# Patient Record
Sex: Female | Born: 1975
Health system: Southern US, Community
[De-identification: ages and names within clinical notes are randomized; demographics above are authoritative.]

## PROBLEM LIST (undated history)

## (undated) DIAGNOSIS — K760 Fatty (change of) liver, not elsewhere classified: Secondary | ICD-10-CM

## (undated) DIAGNOSIS — Z8742 Personal history of other diseases of the female genital tract: Secondary | ICD-10-CM

## (undated) DIAGNOSIS — E119 Type 2 diabetes mellitus without complications: Secondary | ICD-10-CM

## (undated) DIAGNOSIS — F419 Anxiety disorder, unspecified: Secondary | ICD-10-CM

## (undated) DIAGNOSIS — Z87440 Personal history of urinary (tract) infections: Secondary | ICD-10-CM

## (undated) DIAGNOSIS — Z8619 Personal history of other infectious and parasitic diseases: Secondary | ICD-10-CM

## (undated) DIAGNOSIS — M5126 Other intervertebral disc displacement, lumbar region: Secondary | ICD-10-CM

## (undated) DIAGNOSIS — Z8632 Personal history of gestational diabetes: Secondary | ICD-10-CM

## (undated) DIAGNOSIS — M51369 Other intervertebral disc degeneration, lumbar region without mention of lumbar back pain or lower extremity pain: Secondary | ICD-10-CM

## (undated) DIAGNOSIS — E559 Vitamin D deficiency, unspecified: Secondary | ICD-10-CM

## (undated) DIAGNOSIS — E785 Hyperlipidemia, unspecified: Secondary | ICD-10-CM

## (undated) DIAGNOSIS — M5430 Sciatica, unspecified side: Secondary | ICD-10-CM

## (undated) DIAGNOSIS — M5136 Other intervertebral disc degeneration, lumbar region: Secondary | ICD-10-CM

## (undated) DIAGNOSIS — N949 Unspecified condition associated with female genital organs and menstrual cycle: Secondary | ICD-10-CM

## (undated) DIAGNOSIS — N6019 Diffuse cystic mastopathy of unspecified breast: Secondary | ICD-10-CM

## (undated) HISTORY — DX: Personal history of gestational diabetes: Z86.32

## (undated) HISTORY — DX: Anxiety disorder, unspecified: F41.9

## (undated) HISTORY — DX: Personal history of other infectious and parasitic diseases: Z86.19

## (undated) HISTORY — DX: Type 2 diabetes mellitus without complications: E11.9

## (undated) HISTORY — DX: Personal history of urinary (tract) infections: Z87.440

## (undated) HISTORY — DX: Diffuse cystic mastopathy of unspecified breast: N60.19

## (undated) HISTORY — DX: Personal history of other diseases of the female genital tract: Z87.42

## (undated) HISTORY — DX: Unspecified condition associated with female genital organs and menstrual cycle: N94.9

---

## 1999-02-08 ENCOUNTER — Emergency Department (HOSPITAL_COMMUNITY): Admission: EM | Admit: 1999-02-08 | Discharge: 1999-02-08 | Payer: Self-pay | Admitting: Emergency Medicine

## 1999-02-10 ENCOUNTER — Encounter: Admission: RE | Admit: 1999-02-10 | Discharge: 1999-02-10 | Payer: Self-pay | Admitting: Internal Medicine

## 1999-08-22 ENCOUNTER — Emergency Department (HOSPITAL_COMMUNITY): Admission: EM | Admit: 1999-08-22 | Discharge: 1999-08-22 | Payer: Self-pay | Admitting: Emergency Medicine

## 2000-07-10 ENCOUNTER — Ambulatory Visit (HOSPITAL_COMMUNITY): Admission: RE | Admit: 2000-07-10 | Discharge: 2000-07-10 | Payer: Self-pay | Admitting: *Deleted

## 2000-10-04 ENCOUNTER — Encounter: Admission: RE | Admit: 2000-10-04 | Discharge: 2001-01-02 | Payer: Self-pay | Admitting: Internal Medicine

## 2000-10-09 ENCOUNTER — Encounter: Admission: RE | Admit: 2000-10-09 | Discharge: 2000-10-09 | Payer: Self-pay | Admitting: Obstetrics & Gynecology

## 2000-10-16 ENCOUNTER — Encounter: Admission: RE | Admit: 2000-10-16 | Discharge: 2000-10-16 | Payer: Self-pay | Admitting: Obstetrics & Gynecology

## 2000-10-23 ENCOUNTER — Encounter: Admission: RE | Admit: 2000-10-23 | Discharge: 2000-10-23 | Payer: Self-pay | Admitting: Obstetrics & Gynecology

## 2000-11-06 ENCOUNTER — Encounter: Admission: RE | Admit: 2000-11-06 | Discharge: 2000-11-06 | Payer: Self-pay | Admitting: Obstetrics & Gynecology

## 2000-11-11 ENCOUNTER — Ambulatory Visit (HOSPITAL_COMMUNITY): Admission: RE | Admit: 2000-11-11 | Discharge: 2000-11-11 | Payer: Self-pay | Admitting: *Deleted

## 2000-11-23 ENCOUNTER — Inpatient Hospital Stay: Admission: AD | Admit: 2000-11-23 | Discharge: 2000-11-23 | Payer: Self-pay | Admitting: *Deleted

## 2000-11-28 ENCOUNTER — Encounter: Admission: RE | Admit: 2000-11-28 | Discharge: 2000-11-28 | Payer: Self-pay | Admitting: Obstetrics

## 2000-12-05 ENCOUNTER — Encounter: Admission: RE | Admit: 2000-12-05 | Discharge: 2000-12-05 | Payer: Self-pay | Admitting: Obstetrics

## 2000-12-12 ENCOUNTER — Encounter: Admission: RE | Admit: 2000-12-12 | Discharge: 2000-12-12 | Payer: Self-pay | Admitting: Obstetrics

## 2000-12-17 ENCOUNTER — Encounter: Payer: Self-pay | Admitting: *Deleted

## 2000-12-17 ENCOUNTER — Encounter (HOSPITAL_COMMUNITY): Admission: RE | Admit: 2000-12-17 | Discharge: 2000-12-21 | Payer: Self-pay | Admitting: Obstetrics

## 2000-12-19 ENCOUNTER — Encounter: Admission: RE | Admit: 2000-12-19 | Discharge: 2000-12-19 | Payer: Self-pay | Admitting: Obstetrics

## 2000-12-20 ENCOUNTER — Inpatient Hospital Stay (HOSPITAL_COMMUNITY): Admission: AD | Admit: 2000-12-20 | Discharge: 2000-12-24 | Payer: Self-pay | Admitting: *Deleted

## 2000-12-25 ENCOUNTER — Encounter: Admission: RE | Admit: 2000-12-25 | Discharge: 2001-01-01 | Payer: Self-pay | Admitting: Obstetrics

## 2000-12-27 ENCOUNTER — Inpatient Hospital Stay (HOSPITAL_COMMUNITY): Admission: AD | Admit: 2000-12-27 | Discharge: 2000-12-27 | Payer: Self-pay | Admitting: Obstetrics & Gynecology

## 2001-02-03 ENCOUNTER — Inpatient Hospital Stay (HOSPITAL_COMMUNITY): Admission: AD | Admit: 2001-02-03 | Discharge: 2001-02-03 | Payer: Self-pay | Admitting: Obstetrics

## 2002-09-03 ENCOUNTER — Encounter: Admission: RE | Admit: 2002-09-03 | Discharge: 2002-09-03 | Payer: Self-pay | Admitting: Internal Medicine

## 2002-11-06 ENCOUNTER — Encounter: Admission: RE | Admit: 2002-11-06 | Discharge: 2002-11-06 | Payer: Self-pay | Admitting: Internal Medicine

## 2002-12-15 ENCOUNTER — Encounter: Admission: RE | Admit: 2002-12-15 | Discharge: 2002-12-15 | Payer: Self-pay | Admitting: Internal Medicine

## 2003-06-01 ENCOUNTER — Encounter: Admission: RE | Admit: 2003-06-01 | Discharge: 2003-06-01 | Payer: Self-pay | Admitting: Internal Medicine

## 2003-07-15 ENCOUNTER — Encounter: Payer: Self-pay | Admitting: Obstetrics & Gynecology

## 2003-07-15 ENCOUNTER — Inpatient Hospital Stay (HOSPITAL_COMMUNITY): Admission: AD | Admit: 2003-07-15 | Discharge: 2003-07-15 | Payer: Self-pay | Admitting: Obstetrics & Gynecology

## 2003-10-16 DIAGNOSIS — N949 Unspecified condition associated with female genital organs and menstrual cycle: Secondary | ICD-10-CM

## 2003-10-16 HISTORY — DX: Unspecified condition associated with female genital organs and menstrual cycle: N94.9

## 2004-02-23 ENCOUNTER — Inpatient Hospital Stay (HOSPITAL_COMMUNITY): Admission: AD | Admit: 2004-02-23 | Discharge: 2004-02-23 | Payer: Self-pay | Admitting: Obstetrics and Gynecology

## 2004-02-29 ENCOUNTER — Inpatient Hospital Stay (HOSPITAL_COMMUNITY): Admission: RE | Admit: 2004-02-29 | Discharge: 2004-03-03 | Payer: Self-pay | Admitting: Obstetrics and Gynecology

## 2004-04-05 ENCOUNTER — Other Ambulatory Visit: Admission: RE | Admit: 2004-04-05 | Discharge: 2004-04-05 | Payer: Self-pay | Admitting: Obstetrics and Gynecology

## 2004-04-08 ENCOUNTER — Emergency Department (HOSPITAL_COMMUNITY): Admission: EM | Admit: 2004-04-08 | Discharge: 2004-04-08 | Payer: Self-pay | Admitting: Family Medicine

## 2004-04-11 ENCOUNTER — Inpatient Hospital Stay (HOSPITAL_COMMUNITY): Admission: AD | Admit: 2004-04-11 | Discharge: 2004-04-11 | Payer: Self-pay | Admitting: Obstetrics and Gynecology

## 2004-04-14 DIAGNOSIS — N61 Mastitis without abscess: Secondary | ICD-10-CM | POA: Insufficient documentation

## 2004-06-23 ENCOUNTER — Ambulatory Visit: Payer: Self-pay | Admitting: Internal Medicine

## 2004-07-08 ENCOUNTER — Emergency Department (HOSPITAL_COMMUNITY): Admission: EM | Admit: 2004-07-08 | Discharge: 2004-07-08 | Payer: Self-pay | Admitting: Emergency Medicine

## 2004-08-02 ENCOUNTER — Ambulatory Visit: Payer: Self-pay | Admitting: Internal Medicine

## 2004-08-09 ENCOUNTER — Ambulatory Visit: Payer: Self-pay | Admitting: Internal Medicine

## 2004-09-20 ENCOUNTER — Ambulatory Visit: Payer: Self-pay | Admitting: Internal Medicine

## 2004-10-10 ENCOUNTER — Ambulatory Visit: Payer: Self-pay | Admitting: Obstetrics and Gynecology

## 2004-11-02 ENCOUNTER — Ambulatory Visit: Payer: Self-pay | Admitting: Family Medicine

## 2004-11-24 ENCOUNTER — Ambulatory Visit: Payer: Self-pay | Admitting: Family Medicine

## 2004-11-24 ENCOUNTER — Inpatient Hospital Stay (HOSPITAL_COMMUNITY): Admission: AD | Admit: 2004-11-24 | Discharge: 2004-11-24 | Payer: Self-pay | Admitting: Family Medicine

## 2005-01-09 ENCOUNTER — Ambulatory Visit: Payer: Self-pay | Admitting: Obstetrics and Gynecology

## 2005-01-23 ENCOUNTER — Ambulatory Visit: Payer: Self-pay | Admitting: Obstetrics & Gynecology

## 2005-11-15 ENCOUNTER — Ambulatory Visit: Payer: Self-pay | Admitting: Internal Medicine

## 2005-12-13 ENCOUNTER — Ambulatory Visit: Payer: Self-pay | Admitting: *Deleted

## 2005-12-15 ENCOUNTER — Emergency Department (HOSPITAL_COMMUNITY): Admission: EM | Admit: 2005-12-15 | Discharge: 2005-12-15 | Payer: Self-pay | Admitting: Family Medicine

## 2005-12-21 ENCOUNTER — Ambulatory Visit: Payer: Self-pay | Admitting: Internal Medicine

## 2006-07-31 ENCOUNTER — Ambulatory Visit: Payer: Self-pay | Admitting: *Deleted

## 2007-01-08 ENCOUNTER — Ambulatory Visit (HOSPITAL_COMMUNITY): Admission: RE | Admit: 2007-01-08 | Discharge: 2007-01-08 | Payer: Self-pay | Admitting: Gastroenterology

## 2007-01-21 ENCOUNTER — Ambulatory Visit (HOSPITAL_COMMUNITY): Admission: RE | Admit: 2007-01-21 | Discharge: 2007-01-21 | Payer: Self-pay | Admitting: Gastroenterology

## 2007-03-16 DIAGNOSIS — N6019 Diffuse cystic mastopathy of unspecified breast: Secondary | ICD-10-CM

## 2007-03-16 HISTORY — DX: Diffuse cystic mastopathy of unspecified breast: N60.19

## 2008-08-05 IMAGING — RF DG UGI W/ KUB
13 series · 13 of 13 positions shown · non-contrast
Comparison: none

CLINICAL DATA: Intermittent right upper quadrant pain for months.  
UGI WITH KUB:

[Series 1: run · 1 of 1 slices shown (1 of 12)]
[im 1/1]
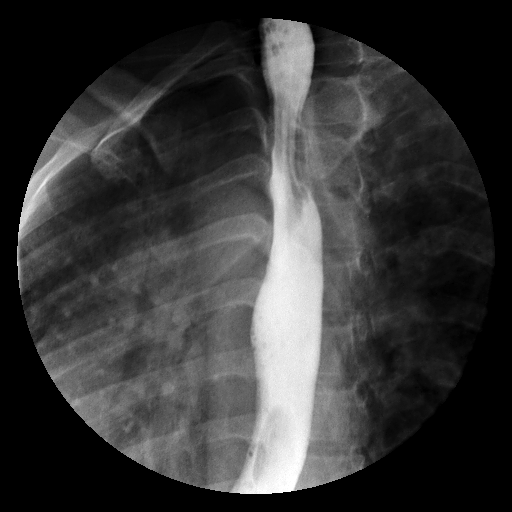

[Series 2: run · 1 of 1 slices shown (2 of 12)]
[im 1/1]
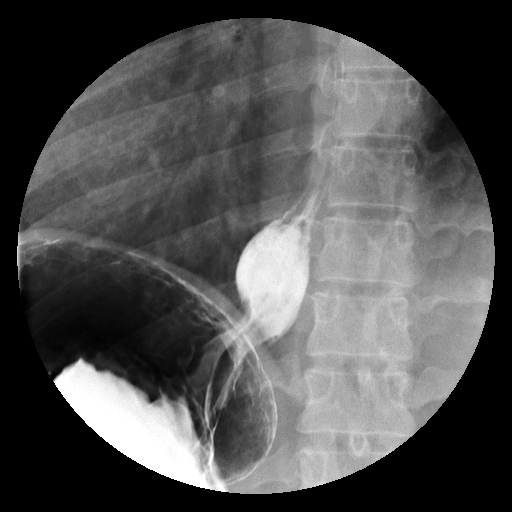

[Series 3: run · 1 of 1 slices shown (3 of 12)]
[im 1/1]
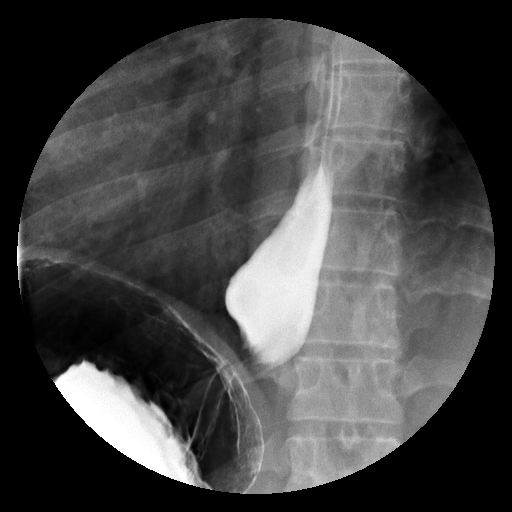

[Series 4: run · 1 of 1 slices shown (4 of 12)]
[im 1/1]
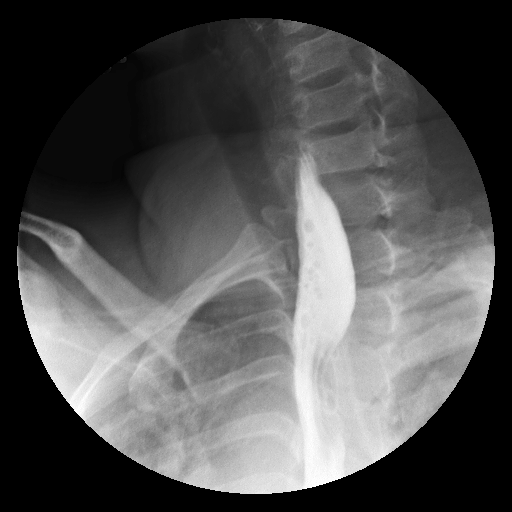

[Series 5: run · 1 of 1 slices shown (5 of 12)]
[im 1/1]
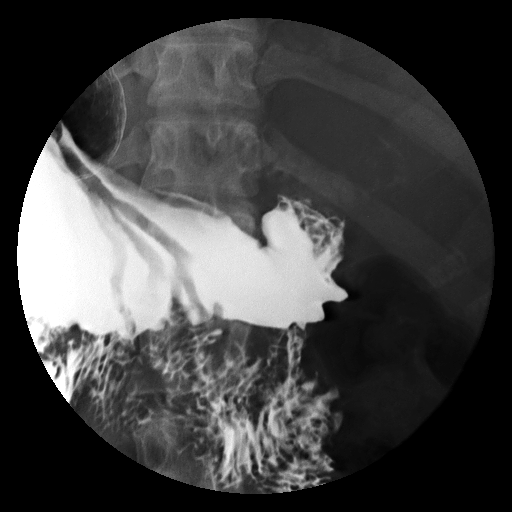

[Series 6: run · 1 of 1 slices shown (6 of 12)]
[im 1/1]
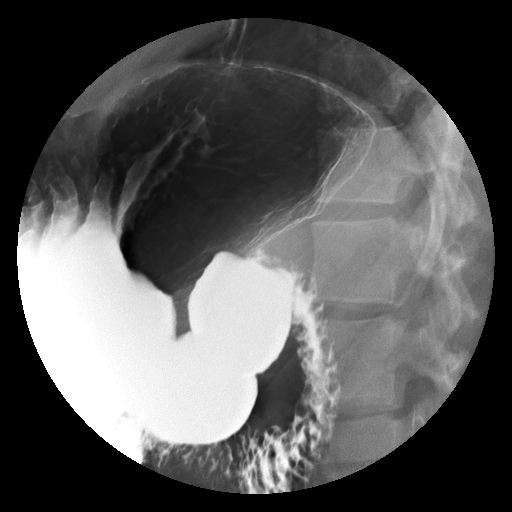

[Series 7: run · 1 of 1 slices shown (7 of 12)]
[im 1/1]
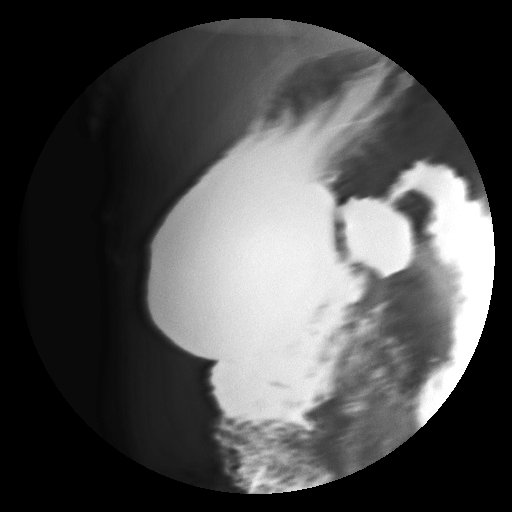

[Series 8: run · 1 of 1 slices shown (8 of 12)]
[im 1/1]
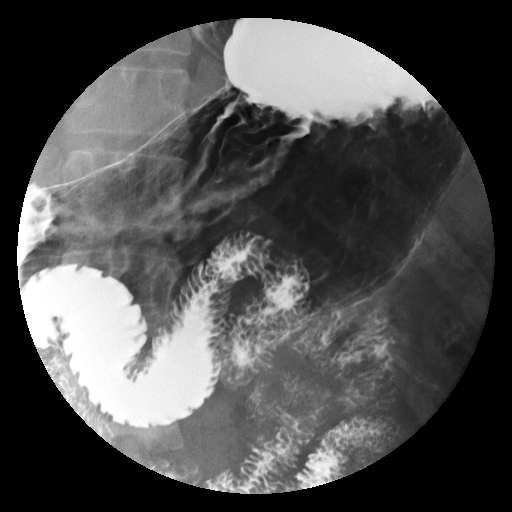

[Series 9: run · 1 of 1 slices shown (9 of 12)]
[im 1/1]
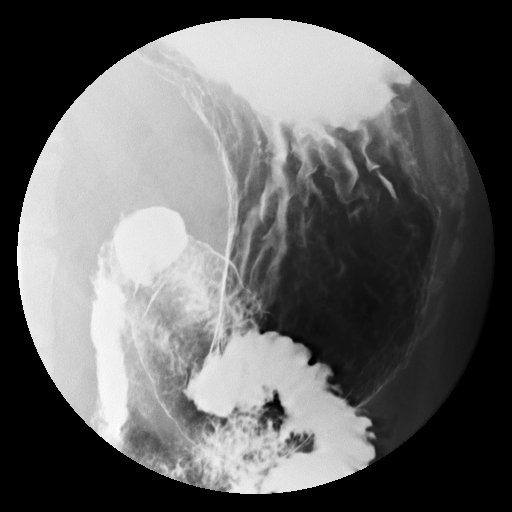

[Series 10: run · 1 of 1 slices shown (10 of 12)]
[im 1/1]
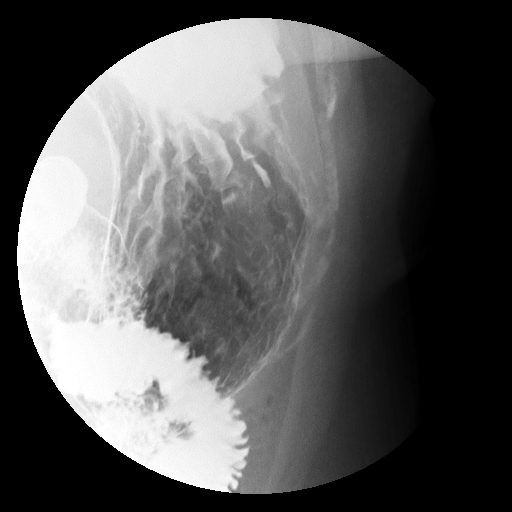

[Series 11: run · 1 of 1 slices shown (11 of 12)]
[im 1/1]
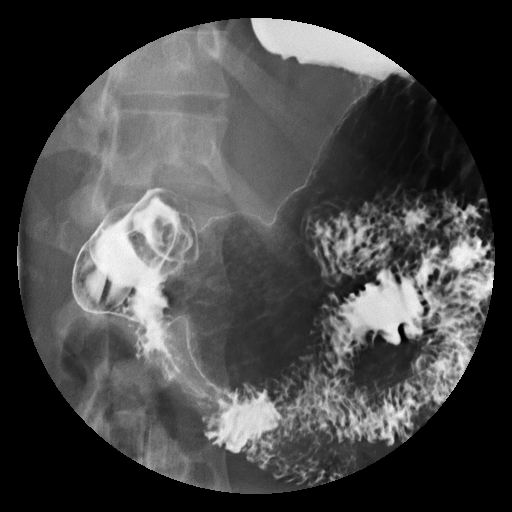

[Series 12: run · 1 of 1 slices shown (12 of 12)]
[im 1/1]
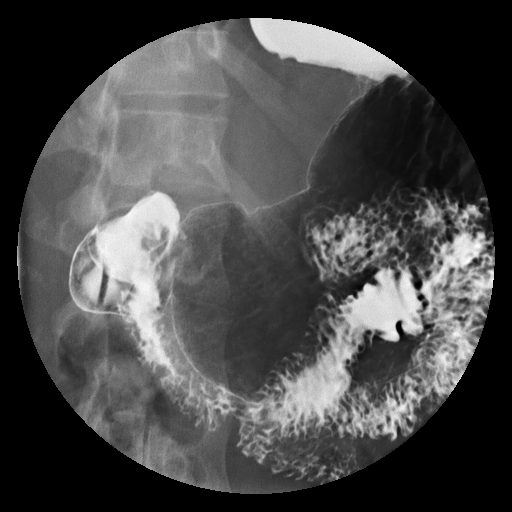

[Series 1001: view not recorded · 0.20mm/px · 1 of 1 slices shown]
[im 1/1]
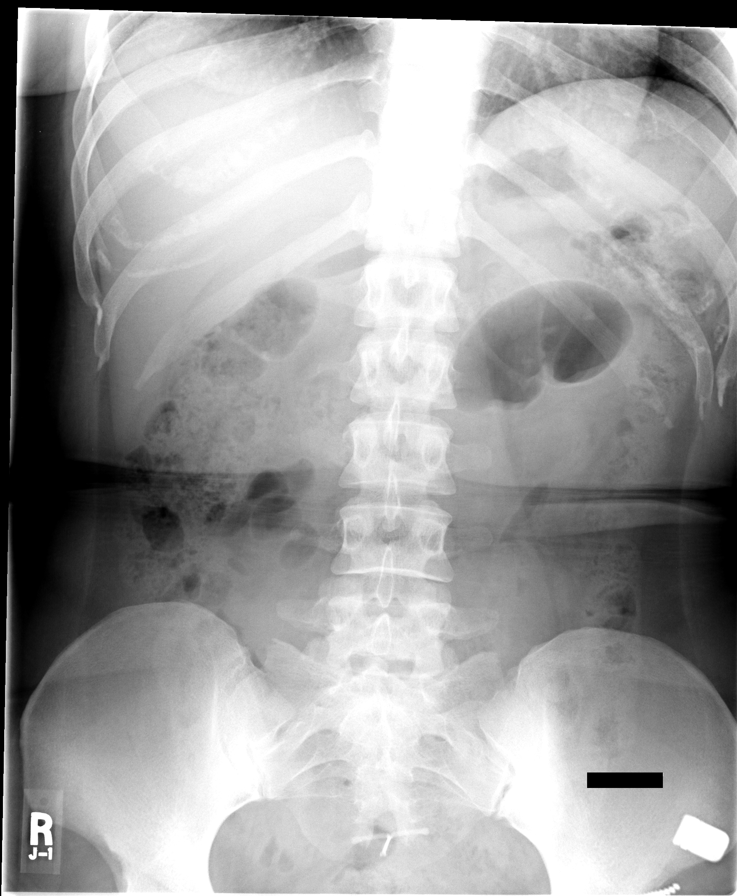

[13 of 13 positions shown; findings below may reference images not displayed]

FINDINGS: Preliminary scout film is normal.  Incidentally, there is an IUD in the mid true pelvis.  Normal esophageal motility and contour.  No hiatal hernia.  Minimal gastroesophageal reflux was noted at fluoroscopy.  No gastric or duodenal mass or ulceration.  Gastric mucosal pattern appears slightly prominent but is probably within the range of normal.  No findings to strongly suggest gastritis.
IMPRESSION: Overall, unremarkable UGI series.  Gastric mucosal pattern appears slightly prominent in caliber potentially representing mild gastritis, however the pattern is probable within the range of normal.  Mild GE reflux.

## 2009-10-15 DIAGNOSIS — Z8742 Personal history of other diseases of the female genital tract: Secondary | ICD-10-CM

## 2009-10-15 HISTORY — DX: Personal history of other diseases of the female genital tract: Z87.42

## 2010-11-04 ENCOUNTER — Encounter: Payer: Self-pay | Admitting: Obstetrics and Gynecology

## 2011-09-05 ENCOUNTER — Other Ambulatory Visit (HOSPITAL_COMMUNITY): Payer: Self-pay | Admitting: *Deleted

## 2011-09-05 ENCOUNTER — Ambulatory Visit (HOSPITAL_COMMUNITY)
Admission: RE | Admit: 2011-09-05 | Discharge: 2011-09-05 | Disposition: A | Discharge status: Home / Self Care | Payer: 59 | Source: Ambulatory Visit | Attending: Obstetrics and Gynecology | Admitting: Obstetrics and Gynecology

## 2011-09-05 DIAGNOSIS — N938 Other specified abnormal uterine and vaginal bleeding: Secondary | ICD-10-CM | POA: Insufficient documentation

## 2011-09-05 DIAGNOSIS — Z975 Presence of (intrauterine) contraceptive device: Secondary | ICD-10-CM

## 2011-09-05 DIAGNOSIS — Z30431 Encounter for routine checking of intrauterine contraceptive device: Secondary | ICD-10-CM | POA: Insufficient documentation

## 2011-09-05 DIAGNOSIS — N949 Unspecified condition associated with female genital organs and menstrual cycle: Secondary | ICD-10-CM | POA: Insufficient documentation

## 2011-09-05 DIAGNOSIS — N854 Malposition of uterus: Secondary | ICD-10-CM | POA: Insufficient documentation

## 2012-03-27 ENCOUNTER — Ambulatory Visit (INDEPENDENT_AMBULATORY_CARE_PROVIDER_SITE_OTHER): Payer: 59 | Admitting: Obstetrics and Gynecology

## 2012-03-27 ENCOUNTER — Encounter: Payer: Self-pay | Admitting: Obstetrics and Gynecology

## 2012-03-27 VITALS — BP 92/60 | HR 60 | Ht 62.0 in | Wt 166.0 lb

## 2012-03-27 DIAGNOSIS — Z87440 Personal history of urinary (tract) infections: Secondary | ICD-10-CM

## 2012-03-27 DIAGNOSIS — N760 Acute vaginitis: Secondary | ICD-10-CM

## 2012-03-27 DIAGNOSIS — N762 Acute vulvitis: Secondary | ICD-10-CM

## 2012-03-27 DIAGNOSIS — B379 Candidiasis, unspecified: Secondary | ICD-10-CM | POA: Insufficient documentation

## 2012-03-27 DIAGNOSIS — O3660X0 Maternal care for excessive fetal growth, unspecified trimester, not applicable or unspecified: Secondary | ICD-10-CM

## 2012-03-27 DIAGNOSIS — Z8742 Personal history of other diseases of the female genital tract: Secondary | ICD-10-CM

## 2012-03-27 DIAGNOSIS — Z22330 Carrier of Group B streptococcus: Secondary | ICD-10-CM

## 2012-03-27 DIAGNOSIS — N61 Mastitis without abscess: Secondary | ICD-10-CM

## 2012-03-27 DIAGNOSIS — Z8619 Personal history of other infectious and parasitic diseases: Secondary | ICD-10-CM

## 2012-03-27 DIAGNOSIS — Z87898 Personal history of other specified conditions: Secondary | ICD-10-CM

## 2012-03-27 DIAGNOSIS — N949 Unspecified condition associated with female genital organs and menstrual cycle: Secondary | ICD-10-CM

## 2012-03-27 DIAGNOSIS — Z8632 Personal history of gestational diabetes: Secondary | ICD-10-CM | POA: Insufficient documentation

## 2012-03-27 LAB — POCT WET PREP (WET MOUNT)

## 2012-03-27 MED ORDER — NYSTATIN-TRIAMCINOLONE 100000-0.1 UNIT/GM-% EX OINT: TOPICAL_OINTMENT | Freq: Two times a day (BID) | CUTANEOUS | Status: DC

## 2012-03-27 MED ORDER — TERCONAZOLE 0.4 % VA CREA: 1.0000 | TOPICAL_CREAM | Freq: Every day | VAGINAL | Status: AC

## 2012-03-27 NOTE — Progress Notes (Signed)
36 YO complains of vaginal itching for months off & on treated with Diflucan but still has itching.  Had antibiotics 1 month ago.Denies urinary tract symptoms or problems with bowel movements. Patient has used the Betamenthasone  with Clotrimazole Cream with no relief (used twice daily x 7 days).  O: Pelvic: EGBUS- wnl, no erythema or inflammation. vagina-normal rugae, cervix-strings visible, uterus-normal size, adnexae-no masses or tenderness  Wet Prep:  pH 4.5  whiff-negative  no clue, trich or yeast   A: Vulvitis  P: Terazol & Vaginal Cream  # 7 1 applicatorful pv qhs x 7 days  Perineal hygiene  Patient advised to call should her symptoms persists so that a consultation with Dr. Pennie Rushing may occur for further management.   RTO-AEx or prn  Jennifer Upshaw, PA-C

## 2012-03-27 NOTE — Progress Notes (Signed)
Color: NO DISCHARGE JUST ITCHING Odor: no Itching:yes Thin:no Thick:no Fever:no Dyspareunia:yes WITH THE ITCHING Hx PID:no HX STD:no Pelvic Pain:no Desires Gc/CT:no Desires HIV,RPR,HbsAG:no

## 2012-03-27 NOTE — Patient Instructions (Signed)
Avoid: - excess soap on genital area (consider using plain oatmeal soap) - use of powder or sprays in genital area - douching - wearing underwear to bed (except with menses) - using more than is directed detergent when washing clothes - tight fitting garments around genital area - excess sugar intake   

## 2012-11-10 ENCOUNTER — Telehealth: Payer: Self-pay | Admitting: Obstetrics and Gynecology

## 2012-11-10 NOTE — Telephone Encounter (Signed)
Spoke with pt rgd msg pt c/o left breast pain pt has appt 11/11/12 at 2:45 with ep pt voice understanding

## 2012-11-11 ENCOUNTER — Ambulatory Visit: Payer: Medicaid Other | Admitting: Obstetrics and Gynecology

## 2012-11-11 ENCOUNTER — Encounter: Payer: Self-pay | Admitting: Obstetrics and Gynecology

## 2012-11-11 VITALS — BP 110/64 | Temp 98.0°F | Wt 165.0 lb

## 2012-11-11 DIAGNOSIS — IMO0001 Reserved for inherently not codable concepts without codable children: Secondary | ICD-10-CM

## 2012-11-11 DIAGNOSIS — N644 Mastodynia: Secondary | ICD-10-CM

## 2012-11-11 NOTE — Progress Notes (Signed)
36 YO complaining of breast pain (left breast) x 5 days.  Denies any breast lumps, trauma or nipple discharge.  O: Breasts: left breast lower half with full feeling but no discreet mass, remainder of breast with fibrocystic changes but no nipple discharge,      skin changes, dimpling, nipple retractions or axillary adenopathy;  right breast-diffuse fibrocystic changes but no discreet mass, no nipple       discharge, skin changes, retractions or adenopathy  A: Left Breast Pain & Fullness  P: Diagnostic Mammogram        RTO-as scheduled or prn  Hazelee Harbold, PA-C

## 2012-11-25 ENCOUNTER — Ambulatory Visit
Admission: RE | Admit: 2012-11-25 | Discharge: 2012-11-25 | Disposition: A | Discharge status: Home / Self Care | Payer: Medicaid Other | Source: Ambulatory Visit | Attending: Obstetrics and Gynecology | Admitting: Obstetrics and Gynecology

## 2012-11-25 DIAGNOSIS — N644 Mastodynia: Secondary | ICD-10-CM

## 2012-12-12 ENCOUNTER — Other Ambulatory Visit: Payer: Self-pay | Admitting: Family Medicine

## 2012-12-12 ENCOUNTER — Ambulatory Visit
Admission: RE | Admit: 2012-12-12 | Discharge: 2012-12-12 | Disposition: A | Discharge status: Home / Self Care | Payer: Medicaid Other | Source: Ambulatory Visit | Attending: Family Medicine | Admitting: Family Medicine

## 2012-12-12 DIAGNOSIS — R1032 Left lower quadrant pain: Secondary | ICD-10-CM

## 2012-12-12 MED ORDER — IOHEXOL 300 MG/ML  SOLN
30.0000 mL | Freq: Once | INTRAMUSCULAR | Status: AC | PRN
  Administered 2012-12-12: 30 mL via ORAL

## 2012-12-12 MED ORDER — IOHEXOL 300 MG/ML  SOLN: 30.0000 mL | Freq: Once | INTRAMUSCULAR | Status: DC | PRN

## 2012-12-12 MED ORDER — IOHEXOL 300 MG/ML  SOLN
100.0000 mL | Freq: Once | INTRAMUSCULAR | Status: AC | PRN
  Administered 2012-12-12: 100 mL via INTRAVENOUS

## 2013-12-21 ENCOUNTER — Other Ambulatory Visit: Payer: Self-pay | Admitting: Internal Medicine

## 2013-12-21 DIAGNOSIS — R1011 Right upper quadrant pain: Secondary | ICD-10-CM

## 2013-12-21 DIAGNOSIS — K7689 Other specified diseases of liver: Secondary | ICD-10-CM

## 2013-12-28 ENCOUNTER — Ambulatory Visit
Admission: RE | Admit: 2013-12-28 | Discharge: 2013-12-28 | Disposition: A | Discharge status: Home / Self Care | Payer: Self-pay | Source: Ambulatory Visit | Attending: Internal Medicine | Admitting: Internal Medicine

## 2013-12-28 DIAGNOSIS — R1011 Right upper quadrant pain: Secondary | ICD-10-CM

## 2013-12-28 DIAGNOSIS — K7689 Other specified diseases of liver: Secondary | ICD-10-CM

## 2014-06-04 ENCOUNTER — Ambulatory Visit: Payer: No Typology Code available for payment source | Attending: Family Medicine | Admitting: Family Medicine

## 2014-06-04 ENCOUNTER — Encounter: Payer: Self-pay | Admitting: Family Medicine

## 2014-06-04 VITALS — BP 119/71 | HR 71 | Temp 98.5°F | Resp 16 | Ht 62.0 in | Wt 155.0 lb

## 2014-06-04 DIAGNOSIS — M25569 Pain in unspecified knee: Secondary | ICD-10-CM | POA: Insufficient documentation

## 2014-06-04 DIAGNOSIS — Z113 Encounter for screening for infections with a predominantly sexual mode of transmission: Secondary | ICD-10-CM | POA: Insufficient documentation

## 2014-06-04 DIAGNOSIS — E663 Overweight: Secondary | ICD-10-CM

## 2014-06-04 DIAGNOSIS — G44209 Tension-type headache, unspecified, not intractable: Secondary | ICD-10-CM | POA: Insufficient documentation

## 2014-06-04 DIAGNOSIS — R7309 Other abnormal glucose: Secondary | ICD-10-CM

## 2014-06-04 DIAGNOSIS — K760 Fatty (change of) liver, not elsewhere classified: Secondary | ICD-10-CM

## 2014-06-04 DIAGNOSIS — E119 Type 2 diabetes mellitus without complications: Secondary | ICD-10-CM

## 2014-06-04 DIAGNOSIS — M25561 Pain in right knee: Secondary | ICD-10-CM | POA: Insufficient documentation

## 2014-06-04 DIAGNOSIS — K7689 Other specified diseases of liver: Secondary | ICD-10-CM | POA: Insufficient documentation

## 2014-06-04 DIAGNOSIS — E559 Vitamin D deficiency, unspecified: Secondary | ICD-10-CM | POA: Insufficient documentation

## 2014-06-04 DIAGNOSIS — R7303 Prediabetes: Secondary | ICD-10-CM

## 2014-06-04 DIAGNOSIS — L659 Nonscarring hair loss, unspecified: Secondary | ICD-10-CM | POA: Insufficient documentation

## 2014-06-04 LAB — CBC
HCT: 41.6 % (ref 36.0–46.0)
Hemoglobin: 14.5 g/dL (ref 12.0–15.0)
MCH: 30.3 pg (ref 26.0–34.0)
MCHC: 34.9 g/dL (ref 30.0–36.0)
MCV: 86.8 fL (ref 78.0–100.0)
PLATELETS: 265 10*3/uL (ref 150–400)
RBC: 4.79 MIL/uL (ref 3.87–5.11)
RDW: 12.8 % (ref 11.5–15.5)
WBC: 7.9 10*3/uL (ref 4.0–10.5)

## 2014-06-04 LAB — GLUCOSE, POCT (MANUAL RESULT ENTRY): POC Glucose: 94 mg/dl (ref 70–99)

## 2014-06-04 LAB — POCT GLYCOSYLATED HEMOGLOBIN (HGB A1C): HEMOGLOBIN A1C: 5.7

## 2014-06-04 MED ORDER — GLUCOSE BLOOD VI STRP: ORAL_STRIP | Status: DC

## 2014-06-04 MED ORDER — SIMVASTATIN 10 MG PO TABS: 10.0000 mg | ORAL_TABLET | Freq: Every day | ORAL | Status: DC

## 2014-06-04 MED ORDER — METFORMIN HCL 500 MG PO TABS: 1000.0000 mg | ORAL_TABLET | Freq: Two times a day (BID) | ORAL | Status: DC

## 2014-06-04 NOTE — Progress Notes (Signed)
   Subjective:    Patient ID: Jennifer Jones, female    DOB: 1976-09-30, 38 y.o.   MRN: [id number] CC: establish care, history of gestational diabetes now with prediabetes, headaches, R knee pain  HPI 38 year old female presents to establish care discussed the following  1. prediabetes: Patient has a history of gestational diabetes. Since then she has been prediabetic with A1c as high as 6.4. She started metformin last year in order to assess the weight loss is lost 10 pounds. She also takes simvastatin for history of fatty liver disease and familial hyperlipidemia. She visibly shaky sometimes during the midday. She denies hypoglycemia. Denies polyuria, polydipsia, vision changes. She endorses one episode of right great toe tingling and sharp pains but this was not recurrent.  2. headaches: Patient with 2 weeks of daily headaches that start in the morning. Headaches are bilateral temple with sharp pains and burning sensation. Headaches last throughout the day and are improved with Tylenol and coffee. Patient usually does not drink coffee but only drinks tea for health benefits. She has no personal history of headache syndrome. She denies excessive salt intake any stimulus. There is no family history of headaches. She denies associated vision changes, nausea, vomiting, weakness. Pain with headache is 7-8/10.  3. right knee pain: Patient with 2 weeks of right knee pain. There's been no injury. There has been a change in her routine and she is working more hours, 10 hours per day. She's exercising less. Pain in her right knee is lateral and posterior. There is no swelling, warmth or redness. She has tried Tyleno without relief.   Soc Hx: chronic non smoker  Review of Systems As per HPI      Objective:   Physical Exam BP 119/71  Pulse 71  Temp(Src) 98.5 F (36.9 C) (Oral)  Resp 16  Ht 5\' 2"  (1.575 m)  Wt 155 lb (70.308 kg)  BMI 28.34 kg/m2  SpO2 99% General appearance: alert, cooperative  and no distress Head: Normocephalic, without obvious abnormality, atraumatic, hair is thinning at temples  Eyes: conjunctivae/corneas clear. PERRL, EOM's intact. Ears: normal TM's and external ear canals both ears Nose: Nares normal. Septum midline. Mucosa normal. No drainage or sinus tenderness. Throat: lips, mucosa, and tongue normal; teeth and gums normal Neck: no adenopathy, supple, symmetrical, trachea midline and thyroid not enlarged, symmetric, no tenderness/mass/nodules Lungs: clear to auscultation bilaterally Heart: regular rate and rhythm, S1, S2 normal, no murmur, click, rub or gallop Extremities: extremities normal, atraumatic, no cyanosis or edema Neurologic: Alert and oriented X 3, normal strength and tone. Normal symmetric reflexes. Normal coordination and gait R knee:  Normal to inspection with no erythema or effusion or obvious bony abnormalities. Palpation normal with no warmth or joint line tenderness or patellar tenderness or condyle tenderness. ROM normal in flexion and extension and lower leg rotation. Ligaments with solid consistent endpoints including ACL, PCL, LCL, MCL. Negative Mcmurray's and provocative meniscal tests. Non painful patellar compression. Patellar and quadriceps tendons unremarkable. Hamstring and quadriceps strength is normal. Skin: slightly erythematous, scaly patch R lateral shoulder.   Lab Results  Component Value Date   HGBA1C 5.7 06/04/2014       Assessment & Plan:

## 2014-06-04 NOTE — Assessment & Plan Note (Signed)
A: exam normal. P: reassurance Back into exercise Tylenol  Consider shot of steroid +/- knee x-ray if pain persist

## 2014-06-04 NOTE — Assessment & Plan Note (Signed)
A: history consistent with familial hyperlipidemia. Patient on statin.  P: Checking lipids today. Avoid potentially hepatotoxic medications.

## 2014-06-04 NOTE — Assessment & Plan Note (Signed)
A: compliant with supplement P: recheck vitamin D level

## 2014-06-04 NOTE — Assessment & Plan Note (Signed)
A: Hair hair thinning and temples bilaterally. Patient admits that her mother's hair is also thin. She like to have her TSH checked. P:  Check a TSH.

## 2014-06-04 NOTE — Assessment & Plan Note (Addendum)
A: new onset tension type headaches in the setting of work changes and mother visiting from [place] consistent with stress induced headaches. P:  chronic daily tension headaches. Continue to rest. Avoid caffeine.  Prophylactic options: amitriptyline/elavil and propranolol

## 2014-06-04 NOTE — Assessment & Plan Note (Signed)
Screening HIV

## 2014-06-04 NOTE — Assessment & Plan Note (Signed)
A: prediabetes. Normal A1c. P: Continue metformin with goal of additional weight loss.  Exercise

## 2014-06-04 NOTE — Addendum Note (Signed)
Addended by: Boykin Nearing on: 06/04/2014 05:55 PM   Modules accepted: Orders

## 2014-06-04 NOTE — Progress Notes (Signed)
Pt is here to establish care. Pt is here to manage her diabetes. Pt states that she is having frequent headaches. Pt is having pain in her right knee that is shooting down her leg.

## 2014-06-04 NOTE — Patient Instructions (Addendum)
Jennifer Jones,   Thank you for coming in today. It was a pleasure meeting you. I look forward to being your primary doctor.  You do not have diabetes at this time. Normal A1c, normal blood sugar. To ensure that your blood sugars remain normal please engage in regular exercise 30 minutes at least 4 days a week, eat plenty of fiber and vegetables.  1. R Knee pain: normal exam. I recommend tylenol 500-650 mg three times daily, regular exercise, elevation and ice joint as needed.   2. Headaches: chronic daily tension headaches. Continue to rest. Avoid caffeine.  Prophylactic options: amitriptyline/elavil and propranolol   Call in one week if you would like to start medication to prevent headaches.  Call for appt for pap at your convenience.   Dr. Adrian Blackwater    Tension Headache A tension headache is pain, pressure, or aching felt over the front and sides of the head. Tension headaches often come after stress, feeling worried (anxiety), or feeling sad or down for a while (depressed). HOME CARE  Only take medicine as told by your doctor.  Lie down in a dark, quiet room when you have a headache.  Keep a journal to find out if certain things bring on headaches. For example, write down:  What you eat and drink.  How much sleep you get.  Any change to your diet or medicines.  Relax by getting a massage or doing other relaxing activities.  Put ice or heat packs on the head and neck area as told by your doctor.  Lessen stress.  Sit up straight. Do not tighten (tense) your muscles.  Quit smoking if you smoke.  Lessen how much alcohol you drink.  Lessen how much caffeine you drink, or stop drinking caffeine.  Eat and exercise regularly.  Get enough sleep.  Avoid using too much pain medicine. GET HELP RIGHT AWAY IF:   Your headache becomes really bad.  You have a fever.  You have a stiff neck.  You have trouble seeing.  Your muscles are weak, or you lose muscle  control.  You lose your balance or have trouble walking.  You feel like you will pass out (faint), or you pass out.  You have really bad symptoms that are different than your first symptoms.  You have problems with the medicines given to you by your doctor.  Your medicines do not work.  Your headache feels different than the other headaches.  You feel sick to your stomach (nauseous) or throw up (vomit). MAKE SURE YOU:   Understand these instructions.  Will watch your condition.  Will get help right away if you are not doing well or get worse. Document Released: 12/26/2009 Document Revised: 12/24/2011 Document Reviewed: 09/21/2011 [facility] Patient Information 2015 [place], [place]. This information is not intended to replace advice given to you by your health care provider. Make sure you discuss any questions you have with your health care provider.

## 2014-06-05 LAB — COMPREHENSIVE METABOLIC PANEL
ALBUMIN: 4.9 g/dL (ref 3.5–5.2)
ALT: 16 U/L (ref 0–35)
AST: 16 U/L (ref 0–37)
Alkaline Phosphatase: 48 U/L (ref 39–117)
BUN: 14 mg/dL (ref 6–23)
CO2: 27 meq/L (ref 19–32)
Calcium: 9.7 mg/dL (ref 8.4–10.5)
Chloride: 102 mEq/L (ref 96–112)
Creat: 0.56 mg/dL (ref 0.50–1.10)
GLUCOSE: 79 mg/dL (ref 70–99)
Potassium: 4.5 mEq/L (ref 3.5–5.3)
Sodium: 140 mEq/L (ref 135–145)
TOTAL PROTEIN: 7.3 g/dL (ref 6.0–8.3)
Total Bilirubin: 0.5 mg/dL (ref 0.2–1.2)

## 2014-06-05 LAB — TSH: TSH: 0.616 u[IU]/mL (ref 0.350–4.500)

## 2014-06-05 LAB — LIPID PANEL
CHOL/HDL RATIO: 3 ratio
Cholesterol: 127 mg/dL (ref 0–200)
HDL: 43 mg/dL (ref >39)
LDL Cholesterol: 67 mg/dL (ref 0–99)
TRIGLYCERIDES: 87 mg/dL (ref <150)
VLDL: 17 mg/dL (ref 0–40)

## 2014-06-05 LAB — VITAMIN B12: VITAMIN B 12: 579 pg/mL (ref 211–911)

## 2014-06-05 LAB — VITAMIN D 25 HYDROXY (VIT D DEFICIENCY, FRACTURES): VIT D 25 HYDROXY: 47 ng/mL (ref 30–89)

## 2014-06-05 LAB — HIV ANTIBODY (ROUTINE TESTING W REFLEX): HIV 1&2 Ab, 4th Generation: NONREACTIVE

## 2014-07-22 ENCOUNTER — Ambulatory Visit: Payer: Self-pay | Attending: Family Medicine

## 2014-08-16 ENCOUNTER — Encounter: Payer: Self-pay | Admitting: Family Medicine

## 2014-09-23 ENCOUNTER — Ambulatory Visit: Payer: Self-pay | Attending: Family Medicine | Admitting: Family Medicine

## 2014-09-23 ENCOUNTER — Encounter: Payer: Self-pay | Admitting: Family Medicine

## 2014-09-23 VITALS — BP 114/78 | HR 81 | Temp 98.5°F | Resp 16 | Ht 62.0 in | Wt 152.0 lb

## 2014-09-23 DIAGNOSIS — E559 Vitamin D deficiency, unspecified: Secondary | ICD-10-CM

## 2014-09-23 DIAGNOSIS — R5383 Other fatigue: Secondary | ICD-10-CM | POA: Insufficient documentation

## 2014-09-23 DIAGNOSIS — R7303 Prediabetes: Secondary | ICD-10-CM

## 2014-09-23 DIAGNOSIS — R7309 Other abnormal glucose: Secondary | ICD-10-CM | POA: Insufficient documentation

## 2014-09-23 LAB — GLUCOSE, POCT (MANUAL RESULT ENTRY): POC GLUCOSE: 114 mg/dL — AB (ref 70–99)

## 2014-09-23 LAB — POCT GLYCOSYLATED HEMOGLOBIN (HGB A1C): HEMOGLOBIN A1C: 5.7

## 2014-09-23 NOTE — Assessment & Plan Note (Signed)
F/u vit D level today

## 2014-09-23 NOTE — Progress Notes (Signed)
F/U DM Stated elevated glucose complaining of shaking and low energy

## 2014-09-23 NOTE — Patient Instructions (Signed)
Jennifer Jones,   Thank you for coming in to see me today.   1. AM symptoms of. For next week do not take AM metformin to see if symptoms improve. Referral to Dr. Buddy Duty. Try to get more hours of sleep per night.   No signs of neuropathy in your feet.   You will be called with vit D results.  F/u in 1 months if AM symptoms continue. F/u in 3 months if symptoms resolve.   Dr. Adrian Blackwater

## 2014-09-23 NOTE — Progress Notes (Signed)
   Subjective:    Patient ID: Jennifer Jones, female    DOB: 1976/06/05, 38 y.o.   MRN: [id number] CC: feeling sick in mornings  HPI 38 yo F presents for f/u visit:   1. Prediabetes: taking metformin 1000 mg BID after breakfast and supper. For past week she is experiencing fatigue,  Shaking, low energy, about 45-60 minutes after eating. This occurs most days of the week. She admits to increase in stress levels since starting a new job. She also has less sleep averaging 6-7 hrs of sleep per night.   2. Strange foot sensations: x 2 weeks. Intermittent. Not pain or numbness.   Soc Hx: non smoker  Review of Systems As per HPI     Objective:   Physical Exam  Wt Readings from Last 3 Encounters:  09/23/14 152 lb (68.947 kg)  06/04/14 155 lb (70.308 kg)  11/11/12 165 lb (74.844 kg)   BP 114/78 mmHg  Pulse 81  Temp(Src) 98.5 F (36.9 C) (Oral)  Resp 16  Ht 5\' 2"  (1.575 m)  Wt 152 lb (68.947 kg)  BMI 27.79 kg/m2  SpO2 95% General appearance: alert, cooperative and no distress Extremities: extremities normal, atraumatic, no cyanosis or edema  Diabetic foot exam done and negative.   Lab Results  Component Value Date   HGBA1C 5.7 09/23/2014   CBG 114       Assessment & Plan:

## 2014-09-23 NOTE — Assessment & Plan Note (Signed)
1. AM symptoms of. For next week do not take AM metformin to see if symptoms improve. Referral to Dr. Buddy Duty. Try to get more hours of sleep per night.   No signs of neuropathy in your feet.  F/u in 1 months if AM symptoms continue. F/u in 3 months if symptoms resolve.

## 2014-09-24 LAB — VITAMIN D 25 HYDROXY (VIT D DEFICIENCY, FRACTURES): Vit D, 25-Hydroxy: 39 ng/mL (ref 30–100)

## 2014-10-04 ENCOUNTER — Telehealth: Payer: Self-pay | Admitting: *Deleted

## 2014-10-04 NOTE — Telephone Encounter (Signed)
-----   Message from Minerva Ends, MD sent at 09/24/2014  9:26 AM EST ----- Vitamin D is down from 47 to 39, still in normal range but should continue daily supplement of 1000-1500 IU of vit D3 with 1000-1500 mg of calcium

## 2014-10-04 NOTE — Telephone Encounter (Signed)
Pt aware of lab results. Advised to continue taking daily supplement

## 2014-10-14 ENCOUNTER — Encounter: Payer: Self-pay | Admitting: Internal Medicine

## 2014-10-14 ENCOUNTER — Ambulatory Visit (INDEPENDENT_AMBULATORY_CARE_PROVIDER_SITE_OTHER): Payer: Self-pay | Admitting: Internal Medicine

## 2014-10-14 VITALS — BP 122/78 | HR 73 | Temp 97.8°F | Resp 12 | Ht 62.5 in | Wt 154.6 lb

## 2014-10-14 DIAGNOSIS — R7303 Prediabetes: Secondary | ICD-10-CM

## 2014-10-14 DIAGNOSIS — R7309 Other abnormal glucose: Secondary | ICD-10-CM

## 2014-10-14 NOTE — Progress Notes (Signed)
Patient ID: Jennifer Jones, female   DOB: 11/18/1975, 38 y.o.   MRN: [id number]  HPI: Jennifer Jones is a 38 y.o.-year-old female, referred by her PCP, Dr. Adrian Blackwater, for management of DM2/prediabetes, dx in 06/2011 - HbA1c was 6.4%, non-insulin-dependent, without complications and in consultation for idiopathic postprandial sd. She also saw Drs. Altheimer and Buddy Duty in the past.  She was dx with idiopathic postprandial sd. >> sxs of hypoglycemia without low CBG readings. Feels very fatigued, quivering, usually happening 40 min-2h after she eats. This does not get better after she eats. If she checks her sugars >> normal: 120s. Sxs started 2012. Mostly after b'fast.  She c/o fatigue >> severe, despite waking up refreshed in am. No N/V/HAs. No anxiety/depression. No palpitations/tremors.  DM2/Prediabetes: Last hemoglobin A1c was: Lab Results  Component Value Date   HGBA1C 5.7 09/23/2014   HGBA1C 5.7 06/04/2014  Initial HbA1c was 6.4% >> lost 10 lbs >> HbA1c decreased.   Pt is on a regimen of: - Metformin 1000 mg bid (started 12/2013)  Pt checks her sugars 1-3x a day and they are: - am: 90-110 - 2h after b'fast: 120-160 - before lunch: n/c - 2h after lunch: n/c - before dinner: 90-110 - 2h after dinner: 120-160 - bedtime: n/c - nighttime: n/c No lows. Lowest sugar was 75; she has hypoglycemia awareness at 80.  Highest sugar was 160.  Glucometer: True result  Pt's meals are: - Breakfast: 2-3 carb serving: toast; 2 waffles + almond butter, eggs; unsweet tea or coffee - Lunch: 2-3 carb serving:  Lean meat + veggies - Dinner: meat + veggies - Snacks: fruit, nuts No sodas, unsweet tea.  She saw nutrition in the past.   - no CKD, last BUN/creatinine:  Lab Results  Component Value Date   BUN 14 06/04/2014   CREATININE 0.56 06/04/2014   - last set of lipids: Lab Results  Component Value Date   CHOL 127 06/04/2014   HDL 43 06/04/2014   LDLCALC 67 06/04/2014   TRIG 87  06/04/2014   CHOLHDL 3.0 06/04/2014   - last eye exam was in 12/2013. No DR.  - no numbness and tingling in her feet.  Pt has no FH of DM.  Has a Mirena IUD since 2004 >> changed in 2012.  ROS: Constitutional: no weight gain/loss, + fatigue, no subjective hyperthermia/hypothermia Eyes: no blurry vision, no xerophthalmia ENT: no sore throat, no nodules palpated in throat, no dysphagia/odynophagia, no hoarseness Cardiovascular: no CP/SOB/palpitations/leg swelling Respiratory: no cough/SOB Gastrointestinal: no N/V/D/C Musculoskeletal: no muscle/joint aches Skin: no rashes Neurological: + tremors - with hypogly episodes/numbness/tingling/dizziness Psychiatric: no depression/anxiety  Past Medical History  Diagnosis Date  . Hx gestational diabetes 2002 and 2005  . H/O candidiasis   . H/O varicella   . Macrosomia   . GBS carrier   . Yeast infection   . Acute mastitis of left breast 04/14/2004  . H/O mastitis   . 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 Hx: UTI (urinary tract infection)   . Hx of candidal vulvovaginitis   . Fibrocystic breast 03/2007  . H/O: menorrhagia 2011  . Diabetes mellitus   . 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 Cesarean section  98.02, 05     x 3   History   Social History  . Marital Status: Married    Spouse Name: N/A    Number of Children: 3  . Years of Education: bachelor  Occupational History  . 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 Smoking status: Never Smoker   . Smokeless tobacco: Never Used  . Alcohol Use: No  . Drug Use: No  . Sexual Activity: Yes    Birth Control/ Protection: IUD     Comment: MIRENA placed in 2012    Other Topics Concern  . Not on file   Social History Narrative   Lives with 3 sons, husband.   Mom visits from [place].    Current Outpatient Prescriptions on File Prior to Visit  Medication Sig Dispense Refill  . cholecalciferol (VITAMIN  D) 1000 UNITS tablet Take 1,000 Units by mouth daily.     Marland Kitchen glucose blood (ACCU-CHEK AVIVA) test strip Use as instructed 100 each 12  . levonorgestrel (MIRENA) 20 MCG/24HR IUD 1 each by Intrauterine route once.    . metFORMIN (GLUCOPHAGE) 500 MG tablet Take 2 tablets (1,000 mg total) by mouth 2 (two) times daily with a meal. 180 tablet 3  . simvastatin (ZOCOR) 10 MG tablet Take 1 tablet (10 mg total) by mouth at bedtime. 90 tablet 3  . loratadine (CLARITIN) 10 MG tablet Take 10 mg by mouth daily.    . naproxen (NAPROSYN) 500 MG tablet Take 500 mg by mouth 2 (two) times daily with a meal.     No current facility-administered medications on file prior to visit.   Allergies  Allergen Reactions  . Codeine   . Darvocet [Propoxyphene N-Acetaminophen] Itching  . Dilaudid [Hydromorphone Hcl] Itching  . Percocet [Oxycodone-Acetaminophen] Itching   Family History  Problem Relation Age of Onset  . Hypertension Maternal Grandfather   . Hypertension Mother   . Heart disease Mother   . Hyperlipidemia Mother   . Hyperlipidemia Sister   . Hyperlipidemia Brother   . Cancer Neg Hx   . Hyperlipidemia Sister    PE: BP 122/78 mmHg  Pulse 73  Temp(Src) 97.8 F (36.6 C) (Oral)  Resp 12  Ht 5' 2.5" (1.588 m)  Wt 154 lb 9.6 oz (70.126 kg)  BMI 27.81 kg/m2  SpO2 97% Wt Readings from Last 3 Encounters:  10/14/14 154 lb 9.6 oz (70.126 kg)  09/23/14 152 lb (68.947 kg)  06/04/14 155 lb (70.308 kg)   Constitutional: overweight, in NAD Eyes: PERRLA, EOMI, no exophthalmos ENT: moist mucous membranes, no thyromegaly, no cervical lymphadenopathy Cardiovascular: RRR, No MRG Respiratory: CTA B Gastrointestinal: abdomen soft, NT, ND, BS+ Musculoskeletal: no deformities, strength intact in all 4 Skin: moist, warm, no rashes Neurological: no tremor with outstretched hands, DTR normal in all 4  ASSESSMENT: 1. Prediabetes/DM2, non-insulin-dependent, without complications  2. Idiopathic postprandial  sd.  PLAN:  1. Patient with mild diabetes or prediabetes, on oral antidiabetic regimen, with good control - We discussed about options for treatment, and I suggested to continue Metformin 1000 mg bid, as this is also helping her with her weight  - continue checking sugars at different times of the day - check 2 times a day, rotating checks - given sugar log and advised how to fill it and to bring it at next appt  - given foot care handout and explained the principles  - given instructions for hypoglycemia management "15-15 rule"  - advised for yearly eye exams >> she is UTD - Return to clinic in 3 mo with sugar log  2.  Idiopathic postprandial sd. Pt with spells of postprandial  fatigue and shakiness, not associated  with hypoglycemia. She started to change her diet >> eats snacks, trying to limit CBG fluctuations. - We discussed about what the notion of "sugar crash" means and that it can be a normal reaction to a high glycemic index food.  - In pts with insulin resistance, for example prediabetic pts, there can be a mismatch between the sugar increase after a meal and pancreatic insulin production. Insulin is secreted in the circulation to late and in too large quantities >> hypoglycemia.  - we discussed at length about diet (including examples) and the importance of avoiding high Glycemic Index foods - given web link for reference and also GI table >> explained how to read it - given instructions for hypoglycemia management "15-15 rule"  - I also noticed that her sxs started to appear after se changed her IUD in 2012 >> advised her to maybe give it a try w/o one >> use another contraception method for few months.  - time spent with the patient: 1 hour, of which >50% was spent in obtaining information about her symptoms, reviewing her previous labs, evaluations, and treatments, counseling her about her condition (please see the discussed topics above), and developing a plan to further  investigate it. She had a number of questions which I addressed.   Patient Instructions    Please continue Metformin 1000 mg 2x a day.  Please review the following website - stay with the lower glycemic load foods: Www.glycemicindex.com  Please consider using foods with a low glycemic load - see table below ([employer]):   *see pt instr. section  For breakfast: - eat eggs no more than 2x a week - try bread + humus + cheese in am  Have 3 meals a day + 2-3 snacks.  Eat as much fruit and veggies as you can. Berries, pears, apples, peaches, apricots - are the best.  Do not drink liquids with a meal, separate them by at least 30 min.   Start the meal with protein and fat and end with carbs.  Carry a snack with you everywhere. Best - to contain ~15 g of carbs.   Please schedule a return appt in 3 months.

## 2014-10-14 NOTE — Patient Instructions (Addendum)
Please continue Metformin 1000 mg 2x a day.  Please review the following website - stay with the lower glycemic load foods: [email or web address]  Please consider using foods with a low glycemic load - see table below [facility]):  Glycemic index and glycemic load for 100+ foods Glycemic index and glycemic load offer information about how foods affect blood sugar and insulin. The lower a food's glycemic index or glycemic load, the less it affects blood sugar and insulin levels. Here is a list of the glycemic index and glycemic load for more than 100 common foods. FOOD Glycemic index (glucose = 100) Serving size (grams) Glycemic load per serving  BAKERY PRODUCTS AND BREADS     Banana cake, made with sugar 47 60 14  Banana cake, made without sugar 55 60 12  Sponge cake, plain 46 63 17  Vanilla cake made from packet mix with vanilla frosting (Betty Crocker) 42 111 24  Apple, made with sugar 44 60 13  Apple, made without sugar 48 60 9  Waffles, Aunt Jemima (Quaker Oats) 76 35 10  Bagel, white, frozen 72 70 25  Baguette, white, plain 95 30 15  Coarse barley bread, 75-80% kernels, average 34 30 7  Hamburger bun 61 30 9  Kaiser roll 73 30 12  Pumpernickel bread 56 30 7  50% cracked wheat kernel bread 58 30 12  White wheat flour bread 71 30 10  Wonder" bread, average 73 30 10  Whole wheat bread, average 71 30 9  100% Whole Grain bread (Natural Ovens) 51 30 7  Pita bread, white 68 30 10  Corn tortilla 52 50 12  Wheat tortilla 30 50 8  BEVERAGES     Coca Cola, average 63 250 mL 16  Fanta, orange soft drink 68 250 mL 23  Lucozade, original (sparkling glucose drink) 9510 250 mL 40  Apple juice, unsweetened, average 44 250 mL 30  Cranberry juice cocktail (Ocean Spray) 68 250 mL 24  Gatorade 78 250 mL 12  Orange juice, unsweetened 50 250 mL 12  Tomato juice, canned 38 250 mL 4  BREAKFAST CEREALS AND RELATED PRODUCTS     All-Bran, average 55 30 12  Coco Pops, average 77 30 20   Cornflakes, average 93 30 23  Cream of Wheat (Nabisco) 66 250 17  Cream of Wheat, Instant (Nabisco) 74 250 22  Grapenuts, average 75 30 16  Muesli, average 66 30 16  Oatmeal, average 55 250 13  Instant oatmeal, average 83 250 30  Puffed wheat, average 80 30 17  Raisin Bran (Kellogg's) 61 30 12  Special K (Kellogg's) 69 30 14  GRAINS     Pearled barley, average 28 150 12  Sweet corn on the cob, average 60 150 20  Couscous, average 65 150 9  Quinoa 53 150 13  White rice, average 89 150 43  Quick cooking white basmati 67 150 28  Brown rice, average 50 150 16  Converted, white rice (Uncle Ben's) 38 150 14  Whole wheat kernels, average 30 50 11  Bulgur, average 48 150 12  COOKIES AND CRACKERS     Graham crackers 74 25 14  Vanilla wafers 77 25 14  Shortbread 64 25 10  Rice cakes, average 82 25 17  Rye crisps, average 64 25 11  Soda crackers 74 25 12  DAIRY PRODUCTS AND ALTERNATIVES     Ice cream, regular 57 50 6  Ice cream, premium 38 50 3  Milk, full fat 41  227mL 5  Milk, skim 32 250 mL 4  Reduced-fat yogurt with fruit, average 33 200 11  FRUITS     Apple, average 39 120 6  Banana, ripe 62 120 16  Dates, dried 42 60 18  Grapefruit 25 120 3  Grapes, average 59 120 11  Orange, average 40 120 4  Peach, average 42 120 5  Peach, canned in light syrup 40 120 5  Pear, average 38 120 4  Pear, canned in pear juice 43 120 5  Prunes, pitted 29 60 10  Raisins 64 60 28  Watermelon 72 120 4  BEANS AND NUTS     Baked beans, average 40 150 6  Blackeye peas, average 33 150 10  Black beans 30 150 7  Chickpeas, average 10 150 3  Chickpeas, canned in brine 38 150 9  Navy beans, average 31 150 9  Kidney beans, average 29 150 7  Lentils, average 29 150 5  Soy beans, average 15 150 1  Cashews, salted 27 50 3  Peanuts, average 7 50 0  PASTA and NOODLES     Fettucini, average 32 180 15  Macaroni, average 47 180 23  Macaroni and Cheese (Kraft) 64 180 32  Spaghetti, white, boiled,  average 46 180 22  Spaghetti, white, boiled 20 min, average 58 180 26  Spaghetti, wholemeal, boiled, average 42 180 17  SNACK FOODS     Corn chips, plain, salted, average 42 50 11  Fruit Roll-Ups 99 30 24  M & M's, peanut 33 30 6  Microwave popcorn, plain, average 55 20 6  Potato chips, average 51 50 12  Pretzels, oven-baked 83 30 16  Snickers Bar 51 60 18  VEGETABLES     Green peas, average 51 80 4  Carrots, average 35 80 2  Parsnips 52 80 4  Baked russet potato, average 111 150 33  Boiled white potato, average 82 150 21  Instant mashed potato, average 87 150 17  Sweet potato, average 70 150 22  Yam, average 54 150 20  MISCELLANEOUS     Hummus (chickpea salad dip) 6 30 0  Chicken nuggets, frozen, reheated in microwave oven 5 min 46 100 7  Pizza, plain baked dough, served with parmesan cheese and tomato sauce 80 100 22  Pizza, Super Supreme (Pizza Hut) 36 100 9  Honey, average 61 25 12  The complete list of the glycemic index and glycemic load for more than 1,000 foods can be found in the article "International tables of glycemic index and glycemic load values: 2008" by Illa Level. Candis Musa Foster-Powell, and Lesleigh Noe in the December 2008 issue of Diabetes Care, Vol. 31, number 12, pages 2281-2283.  For breakfast: - eat eggs no more than 2x a week - try bread + humus + cheese in am  Have 3 meals a day + 2-3 snacks.  Eat as much fruit and veggies as you can. Berries, pears, apples, peaches, apricots - are the best.  Do not drink liquids with a meal, separate them by at least 30 min.   Start the meal with protein and fat and end with carbs.  Carry a snack with you everywhere. Best - to contain ~15 g of carbs.   Please schedule a return appt in 3 months.  PATIENT INSTRUCTIONS FOR TYPE 2 DIABETES:  **Please join MyChart!** - see attached instructions about how to join if you have not done so already.  DIET AND EXERCISE Diet and  exercise is an  important part of diabetic treatment.  We recommended aerobic exercise in the form of brisk walking (working between 40-60% of maximal aerobic capacity, similar to brisk walking) for 150 minutes per week (such as 30 minutes five days per week) along with 3 times per week performing 'resistance' training (using various gauge rubber tubes with handles) 5-10 exercises involving the major muscle groups (upper body, lower body and core) performing 10-15 repetitions (or near fatigue) each exercise. Start at half the above goal but build slowly to reach the above goals. If limited by weight, joint pain, or disability, we recommend daily walking in a swimming pool with water up to waist to reduce pressure from joints while allow for adequate exercise.    BLOOD GLUCOSES Monitoring your blood glucoses is important for continued management of your diabetes. Please check your blood glucoses 2-4 times a day: fasting, before meals and at bedtime (you can rotate these measurements - e.g. one day check before the 3 meals, the next day check before 2 of the meals and before bedtime, etc.).   HYPOGLYCEMIA (low blood sugar) Hypoglycemia is usually a reaction to not eating, exercising, or taking too much insulin/ other diabetes drugs.  Symptoms include tremors, sweating, hunger, confusion, headache, etc. Treat IMMEDIATELY with 15 grams of Carbs: . 4 glucose tablets .  cup regular juice/soda . 2 tablespoons raisins . 4 teaspoons sugar . 1 tablespoon honey Recheck blood glucose in 15 mins and repeat above if still symptomatic/blood glucose <100.  RECOMMENDATIONS TO REDUCE YOUR RISK OF DIABETIC COMPLICATIONS: * Take your prescribed MEDICATION(S) * Follow a DIABETIC diet: Complex carbs, fiber rich foods, (monounsaturated and polyunsaturated) fats * AVOID saturated/trans fats, high fat foods, >2,300 mg salt per day. * EXERCISE at least 5 times a week for 30 minutes or preferably daily.  * DO NOT SMOKE OR DRINK more  than 1 drink a day. * Check your FEET every day. Do not wear tightfitting shoes. Contact us if you develop an ulcer * See your EYE doctor once a year or more if needed * Get a FLU shot once a year * Get a PNEUMONIA vaccine once before and once after age 26 years  GOALS:  * Your Hemoglobin A1c of <7%  * fasting sugars need to be <130 * after meals sugars need to be <180 (2h after you start eating) * Your Systolic BP should be 024 or lower  * Your Diastolic BP should be 80 or lower  * Your HDL (Good Cholesterol) should be 40 or higher  * Your LDL (Bad Cholesterol) should be 100 or lower. * Your Triglycerides should be 150 or lower  * Your Urine microalbumin (kidney function) should be <30 * Your Body Mass Index should be 25 or lower    Please consider the following ways to cut down carbs and fat and increase fiber and micronutrients in your diet: - substitute whole grain for white bread or pasta - substitute brown rice for white rice - substitute 90-calorie flat bread pieces for slices of bread when possible - substitute sweet potatoes or yams for white potatoes - substitute humus for margarine - substitute tofu for cheese when possible - substitute almond or rice milk for regular milk (would not drink soy milk daily due to concern for soy estrogen influence on breast cancer risk) - substitute dark chocolate for other sweets when possible - substitute water - can add lemon or orange slices for taste - for diet sodas (artificial  sweeteners will trick your body that you can eat sweets without getting calories and will lead you to overeating and weight gain in the long run) - do not skip breakfast or other meals (this will slow down the metabolism and will result in more weight gain over time)  - can try smoothies made from fruit and almond/rice milk in am instead of regular breakfast - can also try old-fashioned (not instant) oatmeal made with almond/rice milk in am - order the dressing  on the side when eating salad at a restaurant (pour less than half of the dressing on the salad) - eat as little meat as possible - can try juicing, but should not forget that juicing will get rid of the fiber, so would alternate with eating raw veg./fruits or drinking smoothies - use as little oil as possible, even when using olive oil - can dress a salad with a mix of balsamic vinegar and lemon juice, for e.g. - use agave nectar, stevia sugar, or regular sugar rather than artificial sweateners - steam or broil/roast veggies  - snack on veggies/fruit/nuts (unsalted, preferably) when possible, rather than processed foods - reduce or eliminate aspartame in diet (it is in diet sodas, chewing gum, etc) Read the labels!  Try to read Dr. Janene Harvey book: "Program for Reversing Diabetes" for other ideas for healthy eating.

## 2014-11-16 ENCOUNTER — Encounter: Payer: Self-pay | Admitting: Family Medicine

## 2014-11-16 ENCOUNTER — Ambulatory Visit: Payer: Self-pay | Attending: Family Medicine | Admitting: Family Medicine

## 2014-11-16 VITALS — BP 98/66 | HR 68 | Temp 98.2°F | Resp 16 | Ht 63.0 in | Wt 156.0 lb

## 2014-11-16 DIAGNOSIS — E119 Type 2 diabetes mellitus without complications: Secondary | ICD-10-CM

## 2014-11-16 DIAGNOSIS — J029 Acute pharyngitis, unspecified: Secondary | ICD-10-CM

## 2014-11-16 LAB — GLUCOSE, POCT (MANUAL RESULT ENTRY): POC Glucose: 137 mg/dl — AB (ref 70–99)

## 2014-11-16 MED ORDER — NAPROXEN 500 MG PO TABS: 500.0000 mg | ORAL_TABLET | Freq: Two times a day (BID) | ORAL | Status: DC | PRN

## 2014-11-16 MED ORDER — AMOXICILLIN 500 MG PO CAPS: 500.0000 mg | ORAL_CAPSULE | Freq: Two times a day (BID) | ORAL | Status: DC

## 2014-11-16 NOTE — Patient Instructions (Signed)
Jennifer Jones,  Thank you for coming in today.   I suspect strep vs non-strep pharyngitis. This could be viral but given your history I would like to treat with antibiotic.  1. Amoxicillin twice daily for 10 days 2. Naproxen for pain. 3. Warm salt water gargles.   F/u in 6 weeks or sooner if needed  Dr. Adrian Blackwater

## 2014-11-16 NOTE — Progress Notes (Signed)
   Subjective:    Patient ID: Jennifer Jones, female    DOB: 04-Oct-1976, 39 y.o.   MRN: [id number] CC: L ear pain, sore throat, fever, headache, fatigue  HPI  39 yo F with DM2 well controlled:  1. Sore throat: L sided with L sided ear ache. Headache. Fatigue. X 1 day. Subjective fever this AM. No sick contacts. No improvement with tylenol or ibuprofen in pain.   Soc Hx: non smoker  Review of Systems As per HPI     Objective:   Physical Exam BP 98/66 mmHg  Pulse 68  Temp(Src) 98.2 F (36.8 C)  Resp 16  Ht 5\' 3"  (1.6 m)  Wt 156 lb (70.761 kg)  BMI 27.64 kg/m2  SpO2 98% General appearance: alert, cooperative and no distress Head: Normocephalic, without obvious abnormality, atraumatic Eyes: conjunctivae/corneas clear. PERRL, EOM's intact.  Ears: normal TM's and external ear canals both ears Nose: no discharge, turbinates pink, swollen, grade 1 out of 4 Throat: lips, mucosa, and tongue normal; teeth and gums normal Lungs: clear to auscultation bilaterally Heart: regular rate and rhythm, S1, S2 normal, no murmur, click, rub or gallop  Rapid strep negative  Lab Results  Component Value Date   HGBA1C 5.7 09/23/2014  CBG  137      Assessment & Plan:

## 2014-11-16 NOTE — Assessment & Plan Note (Signed)
I suspect strep vs non-strep pharyngitis. This could be viral but given your history I would like to treat with antibiotic.  1. Amoxicillin twice daily for 10 days 2. Naproxen for pain. 3. Warm salt water gargles.   F/u in 6 weeks or sooner if needed

## 2014-11-16 NOTE — Progress Notes (Signed)
Patient c/o left ear pain, sore throat, fever, headache, lethargy Patient did not have flu shot

## 2014-12-03 ENCOUNTER — Encounter: Payer: Self-pay | Admitting: Family Medicine

## 2014-12-03 ENCOUNTER — Ambulatory Visit: Payer: Self-pay | Attending: Family Medicine | Admitting: Family Medicine

## 2014-12-03 VITALS — BP 108/73 | HR 73 | Temp 97.8°F | Resp 16 | Ht 63.0 in | Wt 157.0 lb

## 2014-12-03 DIAGNOSIS — Z0000 Encounter for general adult medical examination without abnormal findings: Secondary | ICD-10-CM

## 2014-12-03 DIAGNOSIS — M25521 Pain in right elbow: Secondary | ICD-10-CM | POA: Insufficient documentation

## 2014-12-03 DIAGNOSIS — L299 Pruritus, unspecified: Secondary | ICD-10-CM | POA: Insufficient documentation

## 2014-12-03 DIAGNOSIS — H6093 Unspecified otitis externa, bilateral: Secondary | ICD-10-CM

## 2014-12-03 DIAGNOSIS — H61899 Other specified disorders of external ear, unspecified ear: Secondary | ICD-10-CM | POA: Insufficient documentation

## 2014-12-03 DIAGNOSIS — H6090 Unspecified otitis externa, unspecified ear: Secondary | ICD-10-CM | POA: Insufficient documentation

## 2014-12-03 DIAGNOSIS — M25561 Pain in right knee: Secondary | ICD-10-CM

## 2014-12-03 DIAGNOSIS — H61893 Other specified disorders of external ear, bilateral: Secondary | ICD-10-CM

## 2014-12-03 MED ORDER — FLUOCINONIDE 0.05 % EX SOLN: 1.0000 "application " | Freq: Two times a day (BID) | CUTANEOUS | Status: DC

## 2014-12-03 NOTE — Assessment & Plan Note (Signed)
Dental referral placed.

## 2014-12-03 NOTE — Assessment & Plan Note (Signed)
1. Ear itching: refilled fluocinonide ointment

## 2014-12-03 NOTE — Patient Instructions (Addendum)
Mrs. Floren,  Thank you for coming in today.   1. Ear itching: refilled fluocinonide ointment   2. R elbow pain: I suspect bursitis Ice 20 minutes 2-3 times per day Compression bandage especially at night. Naproxen 2 times a day as needed.   3. Dental cleaning: referral placed. You will be called with appt details. This is processed through the orange card.   4. Increased appetite: High protein carnation instant breakfast recommended  F/u in 3 months, sooner if needed   Dr. Adrian Blackwater

## 2014-12-03 NOTE — Progress Notes (Signed)
Patient complaining of right elbow pain x 3 weeks with pain worse this week. Pain worse with movement and at night Patient requesting refill of Fluocinonide ear drops

## 2014-12-03 NOTE — Progress Notes (Signed)
   Subjective:    Patient ID: Jennifer Jones, female    DOB: October 18, 1975, 39 y.o.   MRN: [id number] CC: R elbow pain  HPI 39 yo F:  1. R elbow pain: x 3 weeks. Posterior elbow. Non-radiating. No trauma. Pain is worse with lifting and worse at night.   2. B/l ear itching: after showers. Request refill of fluocinonide. No itching now.   3. Increased appetite: appetite increased and patient gaining weight. Never feels satiated.   4. HM: due for dental cleaning  Soc Hx: non smoker  Review of Systems As per HPI     Objective:   Physical Exam BP 108/73 mmHg  Pulse 73  Temp(Src) 97.8 F (36.6 C)  Resp 16  Ht 5\' 3"  (1.6 m)  Wt 157 lb (71.215 kg)  BMI 27.82 kg/m2  SpO2 97% General appearance: alert, cooperative and no distress  Ears: normal TM's and external ear canals both ears Extremities: R elbow Full ROM. Mild tenderness to deep palpation at olecranon     Assessment & Plan:

## 2014-12-03 NOTE — Assessment & Plan Note (Signed)
2. R elbow pain: I suspect bursitis Ice 20 minutes 2-3 times per day Compression bandage especially at night. Naproxen 2 times a day as needed.

## 2014-12-20 ENCOUNTER — Encounter: Payer: Self-pay | Admitting: Internal Medicine

## 2014-12-20 ENCOUNTER — Ambulatory Visit (INDEPENDENT_AMBULATORY_CARE_PROVIDER_SITE_OTHER): Payer: Self-pay | Admitting: Internal Medicine

## 2014-12-20 ENCOUNTER — Telehealth: Payer: Self-pay | Admitting: General Practice

## 2014-12-20 VITALS — BP 104/60 | HR 80 | Temp 98.1°F | Resp 12 | Wt 160.6 lb

## 2014-12-20 DIAGNOSIS — R7303 Prediabetes: Secondary | ICD-10-CM

## 2014-12-20 DIAGNOSIS — R7309 Other abnormal glucose: Secondary | ICD-10-CM

## 2014-12-20 MED ORDER — ACARBOSE 25 MG PO TABS: 25.0000 mg | ORAL_TABLET | Freq: Every day | ORAL | Status: DC

## 2014-12-20 NOTE — Progress Notes (Signed)
Patient ID: Jennifer Jones, female   DOB: 05-23-1976, 39 y.o.   MRN: [id number]  HPI: Jennifer Jones is a 39 y.o.-year-old female, returning for f/u for DM2/prediabetes, dx in 06/2011 - HbA1c was 6.4%, non-insulin-dependent, without complications and idiopathic postprandial sd. She also saw Drs. Altheimer and Buddy Duty in the past. Last visit 3 mo ago.  She was dx with idiopathic postprandial sd. >> sxs of hypoglycemia without low CBG readings. Feels very fatigued, quivering, usually happening 45 min-2h after she eats. This does not get better after she eats. If she checks her sugars >> normal: 120s. Sxs started 2012. Mostly after b'fast. She has days without spells, also.   Since last visit, she had a sugar 45 at night, after dinner (not associated with sxs!!!). She does not remember what she had for dinner that night. She decreased the Metformin to 500 mg 2x a day after this episode..  She c/o fatigue >> severe, despite waking up refreshed in am. No N/V/HAs. No anxiety/depression. No palpitations/tremors.  She gained 6 lbs since last visit.  DM2/Prediabetes: Last hemoglobin A1c was: Lab Results  Component Value Date   HGBA1C 5.7 09/23/2014   HGBA1C 5.7 06/04/2014  Initial HbA1c was 6.4% >> lost 10 lbs >> HbA1c decreased.   Pt is on a regimen of: - Metformin 500 mg bid   Pt checks her sugars 1-3x a day and they are: - am: 90-110 >> 90-99 - 2h after b'fast: 120-160 >> 120-140 - before lunch: n/c - 2h after lunch: n/c >> 80,120-140 - before dinner: 90-110 >> 90-99 - 2h after dinner: 120-160 >> 120-140 - bedtime: n/c - nighttime: n/c No lows. Lowest sugar was 80; she has hypoglycemia awareness at 80.  Highest sugar was 160 >> 140.  Glucometer: True result  Pt's meals are: - Breakfast: 2-3 carb serving: toast; 2 waffles + almond butter, eggs; unsweet tea or coffee - Lunch: 2-3 carb serving:  Lean meat/egg + veggies - Dinner: meat + veggies - Snacks: fruit, nuts No sodas,  unsweet tea.  She saw nutrition in the past.   - no CKD, last BUN/creatinine:  Lab Results  Component Value Date   BUN 14 06/04/2014   CREATININE 0.56 06/04/2014   - last set of lipids: Lab Results  Component Value Date   CHOL 127 06/04/2014   HDL 43 06/04/2014   LDLCALC 67 06/04/2014   TRIG 87 06/04/2014   CHOLHDL 3.0 06/04/2014   - last eye exam was in 12/2013. No DR.  - no numbness and tingling in her feet.  Has a Mirena IUD since 2004 >> changed in 2012.  ROS: Constitutional: + weight gain, + fatigue, no subjective hyperthermia/hypothermia Eyes: no blurry vision, no xerophthalmia ENT: no sore throat, no nodules palpated in throat, no dysphagia/odynophagia, no hoarseness Cardiovascular: no CP/SOB/palpitations/leg swelling Respiratory: no cough/SOB Gastrointestinal: no N/V/D/C Musculoskeletal: no muscle/joint aches Skin: no rashes Neurological: + tremors - with her pseudohypogly episodes/numbness/tingling/dizziness  I reviewed pt's medications, allergies, PMH, social hx, family hx, and changes were documented in the history of present illness. Otherwise, unchanged from my initial visit note:  Past Medical History  Diagnosis Date  . Hx gestational diabetes 2002 and 2005  . H/O candidiasis   . H/O varicella   . Macrosomia   . GBS carrier   . Yeast infection   . Acute mastitis of left breast 04/14/2004  . H/O mastitis   . 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 Hx: UTI (urinary tract infection)   .  Hx of candidal vulvovaginitis   . Fibrocystic breast 03/2007  . H/O: menorrhagia 2011  . Diabetes mellitus   . 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 Cesarean section  98.02, 05     x 3   History   Social History  . Marital Status: Married    Spouse Name: N/A  . Number of Children: 3  . Years of Education: bachelor    Occupational History  . 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 Smoking  status: Never Smoker   . Smokeless tobacco: Never Used  . Alcohol Use: No  . Drug Use: No  . Sexual Activity: Yes    Birth Control/ Protection: IUD     Comment: MIRENA placed in 2012    Social History Narrative   Lives with 3 sons, husband.   Mom visits from [place].    Current Outpatient Prescriptions on File Prior to Visit  Medication Sig Dispense Refill  . cholecalciferol (VITAMIN D) 1000 UNITS tablet Take 1,000 Units by mouth daily.     . fluocinonide (LIDEX) 0.05 % external solution Apply 1 application topically 2 (two) times daily. 60 mL 0  . glucose blood (ACCU-CHEK AVIVA) test strip Use as instructed 100 each 12  . levonorgestrel (MIRENA) 20 MCG/24HR IUD 1 each by Intrauterine route once.    . loratadine (CLARITIN) 10 MG tablet Take 10 mg by mouth daily.    . metFORMIN (GLUCOPHAGE) 500 MG tablet Take 2 tablets (1,000 mg total) by mouth 2 (two) times daily with a meal. 180 tablet 3  . naproxen (NAPROSYN) 500 MG tablet Take 1 tablet (500 mg total) by mouth 2 (two) times daily as needed (with food). 60 tablet 0  . simvastatin (ZOCOR) 10 MG tablet Take 1 tablet (10 mg total) by mouth at bedtime. 90 tablet 3   No current facility-administered medications on file prior to visit.   Allergies  Allergen Reactions  . Codeine   . Darvocet [Propoxyphene N-Acetaminophen] Itching  . Dilaudid [Hydromorphone Hcl] Itching  . Percocet [Oxycodone-Acetaminophen] Itching   Family History  Problem Relation Age of Onset  . Hypertension Maternal Grandfather   . Hypertension Mother   . Heart disease Mother   . Hyperlipidemia Mother   . Hyperlipidemia Sister   . Hyperlipidemia Brother   . Cancer Neg Hx   . Hyperlipidemia Sister    PE: BP 104/60 mmHg  Pulse 80  Temp(Src) 98.1 F (36.7 C) (Oral)  Resp 12  Wt 160 lb 9.6 oz (72.848 kg)  SpO2 97% Wt Readings from Last 3 Encounters:  12/20/14 160 lb 9.6 oz (72.848 kg)  12/03/14 157 lb (71.215 kg)  11/16/14 156 lb (70.761 kg)    Constitutional: overweight, in NAD Eyes: PERRLA, EOMI, no exophthalmos ENT: moist mucous membranes, no thyromegaly, no cervical lymphadenopathy Cardiovascular: RRR, No MRG Respiratory: CTA B Gastrointestinal: abdomen soft, NT, ND, BS+ Musculoskeletal: no deformities, strength intact in all 4 Skin: moist, warm, no rashes Neurological: no tremor with outstretched hands, DTR normal in all 4  ASSESSMENT: 1. Prediabetes/DM2, non-insulin-dependent, without complications  2. Idiopathic postprandial sd.  PLAN:  1. Patient with mild diabetes or prediabetes, on oral antidiabetic regimen (Metformin), with very good control - I would have continued this regimen, but 2/2 her idiopathic postprandial sd., she will start Acarbose in am, with b'fast and stop Metformin with that meal. She had a  low CBG at night (45, after meal - no sxs then!), so she reduced the Metformin dose to half >> continue 500 mg at dinnertime - continue checking sugars at different times of the day - check 2 times a day, rotating checks - given more sugar log s - check HbA1c today - advised for yearly eye exams >> she is UTD but needs to check with her insurance ([employer] [place] discount card) - Return to clinic in 4 mo with sugar log  2.  Idiopathic postprandial sd. Pt with spells of postprandial  fatigue and tremors, not associated with hypoglycemia. She refuses referral to nutrition as she mentions she knows what she is supposed to eat (we discussed at length at last visit). - Without low sugars associated with her spells, it is difficult to come up with a treatment. The pt is in distress about the spells (cries today in the office) >> Will try low dose Acarbose with b'fast (advised her of possible bloating/flatulence). If this does not work or she cannot tolerate it >> will need to go for another opinion at an academic center. She also saw Dr Buddy Duty and Dr Altheimer here in [place].  Office Visit on 12/20/2014  Component Date Value  Ref Range Status  . Hgb A1c MFr Bld 12/20/2014 5.8  4.6 - 6.5 % Final   Glycemic Control Guidelines for People with Diabetes:Non Diabetic:  <6%Goal of Therapy: <7%Additional Action Suggested:  >8%   Hemoglobin A1c is great.

## 2014-12-20 NOTE — Telephone Encounter (Signed)
Patient presents to clinic to speak with nurse in regards to a question concerning referrals. Patient states she was referred to a endocrinologist, she completed appointment today, 12/20/14 and the endocrinologist is recommending referral to opthalmology concerning diabetes (informed patient that she may need another appointment for new referral, patient states that she was just seen a few weeks ago and would like to see if this message was sufficient). Please assist.

## 2014-12-20 NOTE — Patient Instructions (Signed)
Please continue Metformin but only take it with dinner. Start Acarbose 25 mg before breakfast.  Please stop at the lab.  Please come back for a follow-up appointment in 4 months.

## 2014-12-21 ENCOUNTER — Ambulatory Visit: Payer: Self-pay | Admitting: Internal Medicine

## 2014-12-21 LAB — HEMOGLOBIN A1C: Hgb A1c MFr Bld: 5.8 % (ref 4.6–6.5)

## 2014-12-21 NOTE — Telephone Encounter (Signed)
Ophthalmology referral placed. Please inform patient.

## 2014-12-21 NOTE — Telephone Encounter (Signed)
Patient presents to clinic to speak with nurse in regards to a question concerning referrals. Patient states she was referred to a endocrinologist, she completed appointment today, 12/20/14 and the endocrinologist is recommending referral to opthalmology concerning diabetes (informed patient that she may need another appointment for new referral, patient states that she was just seen a few weeks ago and would like to see if this message was sufficient). Please assist.         Can I place referral?

## 2015-01-07 ENCOUNTER — Ambulatory Visit: Payer: Self-pay | Admitting: Internal Medicine

## 2015-02-07 LAB — HM DIABETES EYE EXAM

## 2015-02-21 ENCOUNTER — Ambulatory Visit: Payer: Self-pay | Attending: Family Medicine

## 2015-03-29 ENCOUNTER — Ambulatory Visit: Payer: Self-pay | Attending: Family Medicine | Admitting: Family Medicine

## 2015-03-29 ENCOUNTER — Encounter: Payer: Self-pay | Admitting: Family Medicine

## 2015-03-29 ENCOUNTER — Encounter: Payer: Self-pay | Admitting: Internal Medicine

## 2015-03-29 VITALS — BP 136/82 | HR 81 | Temp 97.9°F | Resp 18 | Ht 63.0 in | Wt 162.6 lb

## 2015-03-29 DIAGNOSIS — R7309 Other abnormal glucose: Secondary | ICD-10-CM

## 2015-03-29 DIAGNOSIS — R7303 Prediabetes: Secondary | ICD-10-CM

## 2015-03-29 DIAGNOSIS — R350 Frequency of micturition: Secondary | ICD-10-CM

## 2015-03-29 LAB — POCT URINALYSIS DIPSTICK
BILIRUBIN UA: NEGATIVE
Glucose, UA: NEGATIVE
KETONES UA: NEGATIVE
LEUKOCYTES UA: NEGATIVE
Nitrite, UA: NEGATIVE
PROTEIN UA: NEGATIVE
SPEC GRAV UA: 1.025
Urobilinogen, UA: 0.2
pH, UA: 5.5

## 2015-03-29 LAB — POCT GLYCOSYLATED HEMOGLOBIN (HGB A1C): Hemoglobin A1C: 142

## 2015-03-29 LAB — GLUCOSE, POCT (MANUAL RESULT ENTRY): POC GLUCOSE: 130 mg/dL — AB (ref 70–99)

## 2015-03-29 MED ORDER — SULFAMETHOXAZOLE-TRIMETHOPRIM 800-160 MG PO TABS: 1.0000 | ORAL_TABLET | Freq: Two times a day (BID) | ORAL | Status: DC

## 2015-03-29 NOTE — Patient Instructions (Addendum)
Take medication as prescribed. Drink plenty of fluids We will send an urine culture to confirm whether there is an infection or not. Hopefully your symptoms are resolved by the time we call you about this. Make an appointment with Dr. Dennis Bast see about diabetes in the near future

## 2015-03-29 NOTE — Progress Notes (Signed)
Subjective:     Patient ID: Jennifer Jones, female   DOB: 06/20/1976, 39 y.o.   MRN: [id number]  HPI   Presents today with 2 week history of urinary urgency and discomfort with possible blood in urine. She is diabetic and is concerned about this being related to her diabetes. However, she is only doing very small amounts of urine each time. She reports there is only minor suprapubic discomfort. She uses [place] for birth control but occasionally has some menstural bleeding. She request a HBGA1C today.  Review of Systems  She denies fever, chills, flank pain, chest pain, SOB.     Objective:   Physical Exam   Alert, oriented, appropriate, in no distress Lungs are clear to auscultation Heart sounds are regular There is very mild suprapubic tenderness. Urine is negative by dip except for small amount of blood.     Assessment:     Urinary frequency    Plan:     Septra DS, # 10, one po bid -urine culture to confirm or deny infection -follow-up with PCP if symptoms worsen or are not resolving.

## 2015-03-29 NOTE — Progress Notes (Signed)
Patient presents today with frequent urgency to urinate that has been bothering her for a couple of weeks.  Patient complains of discomfort in lower abdomen, but no pain upon urination.  Patient reports noticing blood when she wiped this afternoon.  Patient has no further complaints at this time.

## 2015-03-31 LAB — URINE CULTURE
Colony Count: NO GROWTH
Organism ID, Bacteria: NO GROWTH

## 2015-04-01 ENCOUNTER — Telehealth: Payer: Self-pay

## 2015-04-01 ENCOUNTER — Ambulatory Visit: Payer: Self-pay | Admitting: Family Medicine

## 2015-04-01 NOTE — Telephone Encounter (Signed)
-----   Message from Micheline Chapman, NP sent at 04/01/2015  8:44 AM EDT ----- No infection in urine. Make an appointment to follow-up with Dr. Adrian Blackwater if symptoms have not resolved.

## 2015-04-01 NOTE — Telephone Encounter (Signed)
Nurse called patient, patient verified date of birth. Patient aware of no infection in urine. Patient aware of need for appointment with Dr. Adrian Blackwater if symptoms do not resolve.

## 2015-05-13 ENCOUNTER — Ambulatory Visit: Payer: Self-pay | Admitting: Internal Medicine

## 2015-05-13 DIAGNOSIS — Z0289 Encounter for other administrative examinations: Secondary | ICD-10-CM

## 2015-06-07 ENCOUNTER — Other Ambulatory Visit: Payer: Self-pay | Admitting: Family Medicine

## 2015-06-09 ENCOUNTER — Other Ambulatory Visit: Payer: Self-pay | Admitting: Family Medicine

## 2015-06-09 NOTE — Telephone Encounter (Signed)
Pt is requesting a refill for simvastatin (ZOCOR) 10 MG tablet and her test strips that were last prescribed NOT the aviva. Patient also requested a refill for metFORMIN (GLUCOPHAGE) 500 MG tablet on the 23rd. Please follow up with pt. Thank you. Patient is hoping to be prescribed at least a 90 day supply in the future.

## 2015-06-15 ENCOUNTER — Other Ambulatory Visit: Payer: Self-pay | Admitting: *Deleted

## 2015-06-15 DIAGNOSIS — R7303 Prediabetes: Secondary | ICD-10-CM

## 2015-06-15 DIAGNOSIS — K760 Fatty (change of) liver, not elsewhere classified: Secondary | ICD-10-CM

## 2015-06-15 MED ORDER — SIMVASTATIN 10 MG PO TABS: 10.0000 mg | ORAL_TABLET | Freq: Every day | ORAL | Status: DC

## 2015-06-15 MED ORDER — METFORMIN HCL 500 MG PO TABS: 1000.0000 mg | ORAL_TABLET | Freq: Two times a day (BID) | ORAL | Status: DC

## 2015-06-15 NOTE — Telephone Encounter (Signed)
Patient called requesting test strips refills, is aware other medication is ready

## 2015-06-16 ENCOUNTER — Other Ambulatory Visit: Payer: Self-pay | Admitting: Family Medicine

## 2015-06-16 DIAGNOSIS — R7303 Prediabetes: Secondary | ICD-10-CM

## 2015-06-16 DIAGNOSIS — K760 Fatty (change of) liver, not elsewhere classified: Secondary | ICD-10-CM

## 2015-06-16 MED ORDER — GLUCOSE BLOOD VI STRP: 1.0000 | ORAL_STRIP | Freq: Three times a day (TID) | Status: DC

## 2015-06-16 MED ORDER — METFORMIN HCL 500 MG PO TABS: 1000.0000 mg | ORAL_TABLET | Freq: Two times a day (BID) | ORAL | Status: DC

## 2015-06-23 ENCOUNTER — Encounter: Payer: Self-pay | Admitting: Internal Medicine

## 2015-06-23 ENCOUNTER — Ambulatory Visit (INDEPENDENT_AMBULATORY_CARE_PROVIDER_SITE_OTHER): Payer: Self-pay | Admitting: Internal Medicine

## 2015-06-23 VITALS — BP 112/62 | HR 75 | Temp 98.6°F | Resp 12 | Wt 165.6 lb

## 2015-06-23 DIAGNOSIS — E559 Vitamin D deficiency, unspecified: Secondary | ICD-10-CM

## 2015-06-23 DIAGNOSIS — R7303 Prediabetes: Secondary | ICD-10-CM

## 2015-06-23 DIAGNOSIS — R7309 Other abnormal glucose: Secondary | ICD-10-CM

## 2015-06-23 DIAGNOSIS — K760 Fatty (change of) liver, not elsewhere classified: Secondary | ICD-10-CM

## 2015-06-23 LAB — LIPID PANEL
Cholesterol: 158 mg/dL (ref 0–200)
HDL: 35 mg/dL (ref 35–70)
LDL Cholesterol: 82 mg/dL
Triglycerides: 207 mg/dL — AB (ref 40–160)

## 2015-06-23 LAB — HEMOGLOBIN A1C: HEMOGLOBIN A1C: 6.3 % — AB (ref 4.0–6.0)

## 2015-06-23 MED ORDER — METFORMIN HCL 500 MG PO TABS: 500.0000 mg | ORAL_TABLET | Freq: Two times a day (BID) | ORAL | Status: DC

## 2015-06-23 MED ORDER — GLUCOSE BLOOD VI STRP: 1.0000 | ORAL_STRIP | Freq: Three times a day (TID) | Status: DC

## 2015-06-23 NOTE — Progress Notes (Signed)
Patient ID: Jennifer Jones, female   DOB: 07-19-1976, 39 y.o.   MRN: [id number]  HPI: Jennifer Jones is a 39 y.o.-year-old female, returning for f/u for DM2/prediabetes, dx in 06/2011 - HbA1c was 6.4%, non-insulin-dependent, without complications and idiopathic postprandial sd. She also saw Drs. Altheimer and Buddy Duty in the past. Last visit 6 mo ago.  She was dx with idiopathic postprandial sd.  - sxs of hypoglycemia without low CBG readings. Feels very fatigued, quivering, usually happening 45 min-2h after she eats. This does not get better after she eats. If she checks her sugars >> normal: 120s. Sxs started 2012. Mostly after b'fast. She has days without spells, also. She had a sugar 45 at night before last visit, after dinner (not associated with sxs!!!). She decreased the Metformin to 500 mg 2x a day after this episode, but she again increased it to 1000 mg twice a day after last visit, after which she developed more hypoglycemic episodes. She, however, feels that her symptoms are little bit better lately.  She still c/o fatigue. No N/V/HAs.  We started Acarbose 25 mg before b'fast >> bloating, could not tolerate it.  DM2/Prediabetes: Last hemoglobin A1c was: Lab Results  Component Value Date   HGBA1C 142.0 03/29/2015   HGBA1C 5.8 12/20/2014   HGBA1C 5.7 09/23/2014  Initial HbA1c was 6.4% >> lost 10 lbs >> HbA1c decreased.   Pt is on a regimen of: - Metformin 500 mg bid >> ran out 3 weeks ago  Pt checks her sugars 1-3x a day and they are: - am: 90-110 >> 90-99 >> 90s - 2h after b'fast: 120-160 >> 120-140 >> n/c lately - before lunch: n/c - 2h after lunch: n/c >> 80,120-140 >> N/c  - before dinner: 90-110 >> 90-99 >> n/c - 2h after dinner: 120-160 >> 120-140 >> n/c - bedtime: n/c - nighttime: n/c No lows. Lowest sugar was 80; she has hypoglycemia awareness at 80.  Highest sugar was 160 >> 140 >> ? Now.  Glucometer: True result  Pt's meals are: - Breakfast: 2-3 carb  serving: toast; 2 waffles + almond butter, eggs; unsweet tea or coffee - Lunch: 2-3 carb serving:  Lean meat/egg + veggies - Dinner: meat + veggies - Snacks: fruit, nuts No sodas, unsweet tea.  She saw nutrition in the past.   - no CKD, last BUN/creatinine:  Lab Results  Component Value Date   BUN 14 06/04/2014   CREATININE 0.56 06/04/2014   - last set of lipids: Lab Results  Component Value Date   CHOL 127 06/04/2014   HDL 43 06/04/2014   LDLCALC 67 06/04/2014   TRIG 87 06/04/2014   CHOLHDL 3.0 06/04/2014  On Simvastatin for fatty liver. Reviewed latest right upper quadrant ultrasound (12/28/2013): The liver demonstrates coarse echotexture and increased echogenicity, likely reflecting diffuse steatosis. No overt cirrhotic contour abnormalities or focal lesions are identified. There is no evidence of intrahepatic biliary ductal dilatation. The portal vein is open.  IMPRESSION: Findings consistent with hepatic steatosis.  - last eye exam was in 02/07/2015. No DR. Got glasses. - no numbness and tingling in her feet. + occasional sharp pain on different territories.  She has joint pain in knees and hair loss. He wonders if these are from her statin use.  She has a history of vitamin D deficiency. She is now taking 2000 IU vit D daily. Latest levels reviewed: Component     Latest Ref Rng 06/04/2014 09/23/2014  Vit D, 25-Hydroxy     30 -  100 ng/mL 47 39    ROS: Constitutional: no weight gain, + fatigue, no subjective hyperthermia/hypothermia Eyes: no blurry vision, no xerophthalmia ENT: no sore throat, no nodules palpated in throat, no dysphagia/odynophagia, no hoarseness Cardiovascular: no CP/SOB/palpitations/leg swelling Respiratory: no cough/SOB Gastrointestinal: no N/V/D/C Musculoskeletal: no muscle/joint aches Skin: no rashes Neurological: + tremors - with her pseudohypogly episodes/numbness/tingling/dizziness  I reviewed pt's medications, allergies, PMH,  social hx, family hx, and changes were documented in the history of present illness. Otherwise, unchanged from my initial visit note:  Past Medical History  Diagnosis Date  . Hx gestational diabetes 2002 and 2005  . H/O candidiasis   . H/O varicella   . Macrosomia   . GBS carrier   . Yeast infection   . Acute mastitis of left breast 04/14/2004  . H/O mastitis   . 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 Hx: UTI (urinary tract infection)   . Hx of candidal vulvovaginitis   . Fibrocystic breast 03/2007  . H/O: menorrhagia 2011  . Diabetes mellitus   . 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 Cesarean section  98.02, 05     x 3   History   Social History  . Marital Status: Married    Spouse Name: N/A  . Number of Children: 3  . Years of Education: bachelor    Occupational History  . 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 Smoking status: Never Smoker   . Smokeless tobacco: Never Used  . Alcohol Use: No  . Drug Use: No  . Sexual Activity: Yes    Birth Control/ Protection: IUD     Comment: MIRENA placed in 2012    Social History Narrative   Lives with 3 sons, husband.   Mom visits from [place].    Current Outpatient Prescriptions on File Prior to Visit  Medication Sig Dispense Refill  . acarbose (PRECOSE) 25 MG tablet Take 1 tablet (25 mg total) by mouth daily before breakfast. 30 tablet 2  . cholecalciferol (VITAMIN D) 1000 UNITS tablet Take 1,000 Units by mouth daily.     . fluocinonide (LIDEX) 0.05 % external solution Apply 1 application topically 2 (two) times daily. 60 mL 0  . glucose blood (TRUE METRIX BLOOD GLUCOSE TEST) test strip 1 each by Other route 3 (three) times daily. 100 each 11  . levonorgestrel (MIRENA) 20 MCG/24HR IUD 1 each by Intrauterine route once.    . loratadine (CLARITIN) 10 MG tablet Take 10 mg by mouth daily.    . metFORMIN (GLUCOPHAGE) 500 MG tablet Take 2 tablets (1,000 mg  total) by mouth 2 (two) times daily with a meal. 60 tablet 1  . naproxen (NAPROSYN) 500 MG tablet Take 1 tablet (500 mg total) by mouth 2 (two) times daily as needed (with food). 60 tablet 0  . simvastatin (ZOCOR) 10 MG tablet Take 1 tablet (10 mg total) by mouth at bedtime. Needs OVfor future refills 30 tablet 1  . sulfamethoxazole-trimethoprim (BACTRIM DS,SEPTRA DS) 800-160 MG per tablet Take 1 tablet by mouth 2 (two) times daily. 10 tablet 0   No current facility-administered medications on file prior to visit.   Allergies  Allergen Reactions  . Codeine   . Darvocet [Propoxyphene N-Acetaminophen] Itching  . Dilaudid [Hydromorphone Hcl] Itching  . Percocet [Oxycodone-Acetaminophen] Itching   Family History  Problem Relation Age of Onset  . Hypertension  Maternal Grandfather   . Hypertension Mother   . Heart disease Mother   . Hyperlipidemia Mother   . Hyperlipidemia Sister   . Hyperlipidemia Brother   . Cancer Neg Hx   . Hyperlipidemia Sister    PE: BP 112/62 mmHg  Pulse 75  Temp(Src) 98.6 F (37 C) (Oral)  Resp 12  Wt 165 lb 9.6 oz (75.116 kg)  SpO2 96% Body mass index is 29.34 kg/(m^2). Wt Readings from Last 3 Encounters:  06/23/15 165 lb 9.6 oz (75.116 kg)  03/29/15 162 lb 9.6 oz (73.755 kg)  12/20/14 160 lb 9.6 oz (72.848 kg)   Constitutional: overweight, in NAD Eyes: PERRLA, EOMI, no exophthalmos ENT: moist mucous membranes, no thyromegaly, no cervical lymphadenopathy Cardiovascular: RRR, No MRG Respiratory: CTA B Gastrointestinal: abdomen soft, NT, ND, BS+ Musculoskeletal: no deformities, strength intact in all 4 Skin: moist, warm, no rashes Neurological: no tremor with outstretched hands, DTR normal in all 4  ASSESSMENT: 1. Prediabetes/DM2, non-insulin-dependent, without complications  2. Idiopathic postprandial sd.  3. Vitamin D deficiency  4. Fatty liver disease  PLAN:  1. Patient with mild diabetes or prediabetes, on oral antidiabetic regimen  (Metformin), with very good control - At last visit, I advised her to continue metformin 500 mg twice a da, which he did for the most part but she also increase the dose 1000 mg twice a day at times >>  this caused her to have hypoglycemia in the morning (sugars in the 50s-60s). I advised her not to increase the dose anymore.  - continue checking sugars at different times of the day - check 1-3 times a day, rotating checks - I called in more test strips - Will check HbA1c today - Up-to-date with yearly eye exams - Return to clinic in 6 mo with sugar log  2.  Idiopathic postprandial sd. - Pt with spells of postprandial  fatigue and tremors, not associated with hypoglycemia. these are little be better lately.  - Could not tolerate acarbose due to bloating and gas  - she now accepts a referral to nutrition >> placed  3. vitamin D deficiency  - she has a history of vitamin D deficiency and is on 2000 units of vitamin D daily  -  she would like me to check a vitamin D level  -  This is managed by PCP  4. Fatty liver disease - Reviewed cholesterol levels along with the patient, they are at goal - She tells me that she takes Zocor 10 mg daily to help with this condition, not her cholesterol levels  - She has some joint pain and wonders whether this is from Zocor>> I advised her that she is on a very low dose of Zocor so I doubt this can give her joint pain. I explained that muscle pain is more frequent with status. However, I advised her to take coenzyme Q 100 mg twice a day and see if the pain resolves. If not, she may need to come off the statin for a period of time to see if the pain improves. She agrees with the plan. -  this continues to be managed by PCP  - time spent with the patient: 40 min, of which >50% was spent in obtaining information about her symptoms, reviewing her previous labs, evaluations, and treatments, counseling her about her conditions - some of them new for me: vit D def.,  fatty liver disease (please see the discussed topics above), and developing a plan to further investigate  it; she had a number of questions which I addressed.  Labs faxed from [facility]: - Lipids: 158/207/35/82 -  Hemoglobin A1c 6.3% -  Vitamin D 25  Triglycerides are higher, but this was not a fasting sample. HDL is low.  Continue Lipitor and add coenzyme Q as advised. Vitamin D is also low >> Will advise to increase her vitamin D  Dose to 4000 units daily. Further follow-up by PCP. Hemoglobin A1c is higher. Will advise the patient to start checking sugars more often.  I will advise her to let me know about her sugars in 3 months to decide whether she needs to return for an appointment at that time.

## 2015-06-23 NOTE — Patient Instructions (Addendum)
Please continue Metformin 500 mg 2x a day.  Continue to check sugars 1-3 x day.  Try Coenzyme Q 100 mg 2x a day.  Please come back for a follow-up appointment in 6 months.  Please schedule an appt with Antonieta Iba with nutrition.

## 2015-06-24 ENCOUNTER — Other Ambulatory Visit: Payer: Self-pay | Admitting: *Deleted

## 2015-06-24 MED ORDER — TRUE METRIX METER W/DEVICE KIT: PACK | Status: DC

## 2015-06-27 ENCOUNTER — Telehealth: Payer: Self-pay

## 2015-06-27 ENCOUNTER — Telehealth: Payer: Self-pay | Admitting: Internal Medicine

## 2015-06-27 NOTE — Telephone Encounter (Signed)
-----   Message from Philemon Kingdom, MD sent at 06/24/2015  6:02 PM EDT ----- Larene Beach, can you please call pt:   I received her labs faxed from [facility]:  Triglycerides are higher, but this was not a fasting sample. HDL is low.  Continue Lipitor and add coenzyme Q as advised.  Vitamin D is also low >>  Please advise to increase her vitamin D  Dose to 4000 units daily. Further follow-up by PCP.  Hemoglobin A1c is higher,  At 6.3%.  Please advise the patient to start checking sugars more often. Let me know about her sugars in 3 months to decide whether she needs to return for an appointment at that time.   I will put her lab results on your desk to send her a copy also.   Thank you, C

## 2015-06-27 NOTE — Telephone Encounter (Signed)
Pt calling back for lab results

## 2015-06-27 NOTE — Telephone Encounter (Signed)
Pt has received her results and she understands them

## 2015-06-27 NOTE — Telephone Encounter (Signed)
Left pt a Vm to return call for test results from [facility]

## 2015-06-28 ENCOUNTER — Telehealth: Payer: Self-pay | Admitting: *Deleted

## 2015-06-28 NOTE — Telephone Encounter (Signed)
Spoke with pt. She asked for a copy of her labs be mailed to her. Mailed today.

## 2015-06-30 ENCOUNTER — Encounter: Payer: Self-pay | Attending: Internal Medicine | Admitting: Dietician

## 2015-06-30 ENCOUNTER — Encounter: Payer: Self-pay | Admitting: Dietician

## 2015-06-30 VITALS — Ht 63.0 in | Wt 165.0 lb

## 2015-06-30 DIAGNOSIS — E663 Overweight: Secondary | ICD-10-CM | POA: Insufficient documentation

## 2015-06-30 DIAGNOSIS — R7309 Other abnormal glucose: Secondary | ICD-10-CM | POA: Insufficient documentation

## 2015-06-30 DIAGNOSIS — R7303 Prediabetes: Secondary | ICD-10-CM

## 2015-06-30 DIAGNOSIS — Z713 Dietary counseling and surveillance: Secondary | ICD-10-CM | POA: Insufficient documentation

## 2015-06-30 DIAGNOSIS — Z6826 Body mass index (BMI) 26.0-26.9, adult: Secondary | ICD-10-CM | POA: Insufficient documentation

## 2015-06-30 NOTE — Patient Instructions (Signed)
Consider seeing a doctor about your foot pain so that you will feel better with exercise. Consider some form of exercise most days.  (Armchair exercises or anything that you can tolerate.) Recommend 30 minutes 5 times per week but start slow and increase gradually. Aim for 3 Carb Choices per meal (45 grams) +/- 1 either way  Aim for 0-1 Carbs per snack if hungry  Include protein in moderation with your meals and snacks Sites for recipe ideas: Eatright.org CashmereCloseouts.hu [email or web address] [place] over [employer].com pcrm.org

## 2015-06-30 NOTE — Progress Notes (Signed)
Diabetes Self-Management Education  Visit Type: First/Initial  Appt. Start Time: 1000 Appt. End Time: 1130  06/30/2015  Jennifer Jones, identified by name and date of birth, is a 39 y.o. female with a diagnosis of Diabetes: Pre-Diabetes.   Patient lives with her husband and 3 sons ages 96, 56, 25.  She does the shopping and cooking.  She will cook some foods for her family that she no longer eats.  In attempt to lose weight and improve her health she has made the following changes over the past week:  Hot tea instead of coffee with cream and sugar, no cream cheese or crackers, no cheese or pizae, no red meat.  She has very achy fee and pain in knees and hips with extended walking.  This is currently limiting exercise.  Labs noted HDL:  35 ("does not increase no matter what I do"), Trig: 207, Vit D 25- patient is on supplementation.  She also has a fatty liver.  She is to begin coenzyme Q10.  She did not tolerate Acarbose and stopped this.    She is here today for motivation to lose weight.  Her highest weight was 180 lbs in 2007, lowest weight of 150 lbs in January of 2016. Weight today of 165 lbs.  She states that she gained due to boredom.  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  She also has idiopathic postprandial symptoms which increase further when she loses weight.  Symptoms are most often in the morning.    ASSESSMENT  Height 5\' 3"  (1.6 m), weight 165 lb (74.844 kg). Body mass index is 29.24 kg/(m^2).      Diabetes Self-Management Education - 06/30/15 1810    Psychosocial Assessment   Other persons present Patient   Patient Concerns Weight Control   Special Needs None   Preferred Learning Style No preference indicated   Learning Readiness Ready   How often do you need to have someone help you when you read instructions, pamphlets, or other written materials from your doctor or pharmacy? 1 - Never   What is the last grade level  you completed in school? bachelor's   Complications   Last HgB A1C per patient/outside source 6.3 %   How often do you check your blood sugar? 1-2 times/day   Fasting Blood glucose range (mg/dL) 70-129   Postprandial Blood glucose range (mg/dL) 130-179   Number of hypoglycemic episodes per month 1   Can you tell when your blood sugar is low? Yes   What do you do if your blood sugar is low? eat   Number of hyperglycemic episodes per week 0   Have you had a dilated eye exam in the past 12 months? Yes   Have you had a dental exam in the past 12 months? Yes   Are you checking your feet? Yes   Dietary Intake   Breakfast [employer] toast with almond butter and olive oil, coffee  7:30   Snack (morning) fruit or nuts or smoothie (whey protien, water, 1/2c. berries, 1/3 banana)  10   Lunch salmon, vegetables,    Snack (afternoon) fruit or nuts   Dinner salad with 1 T ranch and red beans   Snack (evening) none   Beverage(s) water   Exercise   Exercise Type ADL's;Light (walking / raking leaves)  complains of very achy feet, pain in knes and hips   How many days per week to you exercise? 3  How many minutes per day do you exercise? 45   Total minutes per week of exercise 135   Patient Education   Previous Diabetes Education Yes (please comment)  2012   Disease state  Definition of diabetes, type 1 and 2, and the diagnosis of diabetes   Nutrition management  Role of diet in the treatment of diabetes and the relationship between the three main macronutrients and blood glucose level;Food label reading, portion sizes and measuring food.   Physical activity and exercise  Role of exercise on diabetes management, blood pressure control and cardiac health.   Acute complications Taught treatment of hypoglycemia - the 15 rule.   Chronic complications Lipid levels, blood glucose control and heart disease   Psychosocial adjustment Worked with patient to identify barriers to care and solutions   Personal  strategies to promote health Lifestyle issues that need to be addressed for better diabetes care   Individualized Goals (developed by patient)   Nutrition Follow meal plan discussed   Physical Activity Exercise 3-5 times per week;30 minutes per day   Medications take my medication as prescribed   Monitoring  test my blood glucose as discussed   Reducing Risk treat hypoglycemia with 15 grams of carbs if blood glucose less than 70mg /dL   Outcomes   Future DMSE 4-6 wks   Program Status Completed      Individualized Plan for Diabetes Self-Management Training:   Learning Objective:  Patient will have a greater understanding of diabetes self-management. Patient education plan is to attend individual and/or group sessions per assessed needs and concerns.   Plan:   Patient Instructions  Consider seeing a doctor about your foot pain so that you will feel better with exercise. Consider some form of exercise most days.  (Armchair exercises or anything that you can tolerate.) Recommend 30 minutes 5 times per week but start slow and increase gradually. Aim for 3 Carb Choices per meal (45 grams) +/- 1 either way  Aim for 0-1 Carbs per snack if hungry  Include protein in moderation with your meals and snacks Sites for recipe ideas: Eatright.org CashmereCloseouts.hu [email or web address] [place]ks over [employer].com pcrm.org  Expected Outcomes:   Demonstrated interest in learning.  Expect positive outcomes.  Education material provided: Meal plan card and Snack sheet, weight loss, Diabetes Forecast magazine sample  If problems or questions, patient to contact team via:  Phone and Email  Future DSME appointment: 4-6 wks

## 2015-07-08 ENCOUNTER — Ambulatory Visit: Payer: Self-pay | Attending: Family Medicine | Admitting: Family Medicine

## 2015-07-08 ENCOUNTER — Encounter: Payer: Self-pay | Admitting: Family Medicine

## 2015-07-08 VITALS — BP 105/69 | HR 71 | Temp 98.1°F | Resp 16 | Ht 66.0 in | Wt 165.0 lb

## 2015-07-08 DIAGNOSIS — H6193 Disorder of external ear, unspecified, bilateral: Secondary | ICD-10-CM | POA: Insufficient documentation

## 2015-07-08 DIAGNOSIS — Z0000 Encounter for general adult medical examination without abnormal findings: Secondary | ICD-10-CM

## 2015-07-08 DIAGNOSIS — R7303 Prediabetes: Secondary | ICD-10-CM

## 2015-07-08 DIAGNOSIS — H61893 Other specified disorders of external ear, bilateral: Secondary | ICD-10-CM

## 2015-07-08 DIAGNOSIS — M2550 Pain in unspecified joint: Secondary | ICD-10-CM | POA: Insufficient documentation

## 2015-07-08 DIAGNOSIS — M79672 Pain in left foot: Secondary | ICD-10-CM | POA: Insufficient documentation

## 2015-07-08 DIAGNOSIS — Z79899 Other long term (current) drug therapy: Secondary | ICD-10-CM | POA: Insufficient documentation

## 2015-07-08 DIAGNOSIS — R7309 Other abnormal glucose: Secondary | ICD-10-CM | POA: Insufficient documentation

## 2015-07-08 DIAGNOSIS — M79671 Pain in right foot: Secondary | ICD-10-CM | POA: Insufficient documentation

## 2015-07-08 LAB — GLUCOSE, POCT (MANUAL RESULT ENTRY): POC GLUCOSE: 101 mg/dL — AB (ref 70–99)

## 2015-07-08 MED ORDER — FLUOCINONIDE 0.05 % EX SOLN: 1.0000 "application " | Freq: Two times a day (BID) | CUTANEOUS | Status: DC

## 2015-07-08 NOTE — Progress Notes (Signed)
Patient ID: Noela Brothers, female   DOB: April 18, 1976, 39 y.o.   MRN: [id number]   Subjective:  Patient ID: [name], female    DOB: 01-25-1976  Age: 39 y.o. MRN: [id number]  CC: Foot Pain   HPI Bhavana Romanello presents for foot pain. Also has joint pain. Pain for many years. No joint swelling. Pain migrates to different joints, elbow, low back, hips, knee. Also with foot pain that is constant. No redness, swelling or rash. No fever or chill. Pain is interfering with life. Mom and sister with arthritis.   Social History  Substance Use Topics  . Smoking status: Never Smoker   . Smokeless tobacco: Never Used  . Alcohol Use: No    Outpatient Prescriptions Prior to Visit  Medication Sig Dispense Refill  . Blood Glucose Monitoring Suppl (TRUE METRIX METER) W/DEVICE KIT Used as instructed 1 kit 0  . cholecalciferol (VITAMIN D) 1000 UNITS tablet Take 1,000 Units by mouth daily.     Marland Kitchen glucose blood (TRUE METRIX BLOOD GLUCOSE TEST) test strip 1 each by Other route 3 (three) times daily. 100 each 11  . levonorgestrel (MIRENA) 20 MCG/24HR IUD 1 each by Intrauterine route once.    . loratadine (CLARITIN) 10 MG tablet Take 10 mg by mouth daily.    . metFORMIN (GLUCOPHAGE) 500 MG tablet Take 1 tablet (500 mg total) by mouth 2 (two) times daily with a meal. 180 tablet 3  . naproxen (NAPROSYN) 500 MG tablet Take 1 tablet (500 mg total) by mouth 2 (two) times daily as needed (with food). 60 tablet 0  . simvastatin (ZOCOR) 10 MG tablet Take 1 tablet (10 mg total) by mouth at bedtime. Needs OVfor future refills 30 tablet 1  . acarbose (PRECOSE) 25 MG tablet Take 1 tablet (25 mg total) by mouth daily before breakfast. (Patient not taking: Reported on 07/08/2015) 30 tablet 2  . fluocinonide (LIDEX) 0.05 % external solution Apply 1 application topically 2 (two) times daily. (Patient not taking: Reported on 06/30/2015) 60 mL 0  . sulfamethoxazole-trimethoprim (BACTRIM DS,SEPTRA DS) 800-160 MG per  tablet Take 1 tablet by mouth 2 (two) times daily. (Patient not taking: Reported on 06/30/2015) 10 tablet 0   No facility-administered medications prior to visit.    ROS Review of Systems  Constitutional: Negative for fever and chills.  Eyes: Negative for visual disturbance.  Respiratory: Negative for shortness of breath.   Cardiovascular: Negative for chest pain.  Gastrointestinal: Negative for abdominal pain and blood in stool.  Musculoskeletal: Positive for arthralgias. Negative for back pain.  Skin: Negative for rash.  Allergic/Immunologic: Negative for immunocompromised state.  Hematological: Negative for adenopathy. Does not bruise/bleed easily.  Psychiatric/Behavioral: Negative for suicidal ideas and dysphoric mood.    Objective:  BP 105/69 mmHg  Pulse 71  Temp(Src) 98.1 F (36.7 C) (Oral)  Resp 16  Ht 5' 6"  (1.676 m)  Wt 165 lb (74.844 kg)  BMI 26.64 kg/m2  SpO2 97%  BP/Weight 07/08/2015 04/13/5283 10/17/2438  Systolic BP 102 - 725  Diastolic BP 69 - 62  Wt. (Lbs) 165 165 165.6  BMI 26.64 29.24 29.34   Physical Exam  Constitutional: She is oriented to person, place, and time. She appears well-developed and well-nourished. No distress.  HENT:  Head: Normocephalic and atraumatic.  Cardiovascular: Normal rate, regular rhythm, normal heart sounds and intact distal pulses.   Pulmonary/Chest: Effort normal and breath sounds normal.  Musculoskeletal: She exhibits no edema.  Neurological: She is alert and oriented to person,  place, and time.  Skin: Skin is warm and dry. No rash noted.  Psychiatric: She has a normal mood and affect.   Lab Results  Component Value Date   HGBA1C 6.3* 06/23/2015     Assessment & Plan:   Problem List Items Addressed This Visit    Foot pain, bilateral   Relevant Orders   Ambulatory referral to Podiatry   Irritation of external ear canal   Relevant Medications   fluocinonide (LIDEX) 0.05 % external solution   Joint pain   Relevant  Orders   TSH   Sedimentation Rate   Rheumatoid factor   C-reactive protein   Prediabetes - Primary (Chronic)   Relevant Orders   POCT glucose (manual entry) (Completed)      No orders of the defined types were placed in this encounter.    Follow-up: No Follow-up on file.   Boykin Nearing MD

## 2015-07-08 NOTE — Progress Notes (Signed)
C/C joint pain, going out for a while  Pain on feet  No hx tobacco

## 2015-07-08 NOTE — Patient Instructions (Signed)
Jennifer Jones,  Apply for [facility] discount Come for labs once you know about discount. I will review labs and you will be called with plan.  Podiatry referreal  F/u with me in 6-8  weeks  Dr. Adrian Blackwater

## 2015-07-11 DIAGNOSIS — Z0000 Encounter for general adult medical examination without abnormal findings: Secondary | ICD-10-CM

## 2015-07-27 ENCOUNTER — Encounter: Payer: Self-pay | Admitting: *Deleted

## 2015-08-01 ENCOUNTER — Ambulatory Visit: Payer: No Typology Code available for payment source

## 2015-08-01 ENCOUNTER — Encounter: Payer: Self-pay | Admitting: Podiatry

## 2015-08-01 ENCOUNTER — Ambulatory Visit (INDEPENDENT_AMBULATORY_CARE_PROVIDER_SITE_OTHER): Payer: No Typology Code available for payment source | Admitting: Podiatry

## 2015-08-01 VITALS — BP 123/74 | HR 77 | Resp 16

## 2015-08-01 DIAGNOSIS — M779 Enthesopathy, unspecified: Secondary | ICD-10-CM

## 2015-08-01 DIAGNOSIS — M79673 Pain in unspecified foot: Secondary | ICD-10-CM

## 2015-08-01 NOTE — Progress Notes (Signed)
Subjective:     Patient ID: Jennifer Jones, female   DOB: 05/02/1976, 39 y.o.   MRN: [id number]  HPI Patient presents the office or concerns of pain to the outside aspect of her feet which radiate down her foot at times. The right side is worse than the left. She describes as an achy pain has been ongoing over the last several months. She denies any history of injury or trauma to the area. She denies any tingling or numbness. She states the majority the pain is after she walks for prolonged periods of time. She has no pain at rest and the pain does not wake her up. She states approximately help her pain. She states the area feels tight. No other complaints at this time.  Review of Systems  All other systems reviewed and are negative.      Objective:   Physical Exam General: AAO x3, NAD  Dermatological: Skin is warm, dry and supple bilateral. Nails x 10 are well manicured; remaining integument appears unremarkable at this time. There are no open sores, no preulcerative lesions, no rash or signs of infection present.  Vascular: Dorsalis Pedis artery and Posterior Tibial artery pedal pulses are 2/4 bilateral with immedate capillary fill time. Pedal hair growth present. No varicosities and no lower extremity edema present bilateral. There is no pain with calf compression, swelling, warmth, erythema.   Neruologic: Grossly intact via light touch bilateral. Vibratory intact via tuning fork bilateral. Protective threshold with Semmes Wienstein monofilament intact to all pedal sites bilateral. Patellar and Achilles deep tendon reflexes 2+ bilateral. No Babinski or clonus noted bilateral.   Musculoskeletal: No gross boney pedal deformities bilateral. There is mild tenderness to subjectively upon palpation of the course of the peroneal tendons inferior to the lateral malleolus and just proximal to the fifth metatarsal base. She states the areas not painful particularly pressure although this is the area  where it hurts when she walks. There is no specific area pinpoint bony tenderness or pain the vibratory sensation. There is no overlying edema, erythema, increase in warmth. Equinus is present. Mild decrease in medial arch height bilaterally. No pain, crepitus, or limitation noted with foot and ankle range of motion bilateral. Muscular strength 5/5 in all groups tested bilateral.  Gait: Unassisted, Nonantalgic.      Assessment:     Patient presents with the peroneal tendinitis; likely a result of biomechanical changes    Plan:     -Treatment options discussed including all alternatives, risks, and complications -X-rays were obtained and reviewed with the patient.  -Etiology of symptoms were discussed -Discussed stretching and rehabilitation exercises to be performed daily. -Ice to the area -Ankle brace dispensed to the right side as this appears to be more symptomatic subjectively. As her pain subsided she can decreased ankle brace. -Discussed supportive shoe gear orthotics. -Continue naproxen as needed. -Follow-up in 4-6 weeks if the pain continues or sooner if any problems arise. In the meantime, encouraged to call the office with any questions, concerns, change in symptoms.   Celesta Gentile, DPM

## 2015-08-01 NOTE — Patient Instructions (Signed)
Peroneal Tendon Subluxation and Dislocation With Rehab The two peroneal tendons are located on the outer side of the back of the ankle, and connect the muscles that allow you to stand on your tip toes. These tendons are susceptible to two similar injuries known as a dislocation or a subluxation. A dislocation occurs when one or both of the tendons is out of alignment and slips completely out of the groove where they normally rest. A subluxation is a lesser but similar injury in which one or both of the tendons slide in and out of normal alignment. The tendons are normally held into place by a ligament-like structure (retinaculum); however, when this structure is injured dislocations and subluxations are common. SYMPTOMS   A "pop" or "snap" heard or felt at the time of injury.  Pain, swelling, tenderness, and bruising (contusion) at the injury site, behind the outer ankle, that initially is worsened by standing or walking.  Pain and weakness when trying to push the foot toward the injured side.  Often no problems (asymptomatic) when walking, but symptomatic when trying to cut or pivot on the injured leg, moving toward the side of injury.  A crackling sound (crepitation ) when the tendon is moved or touched.  Numbness or paralysis below the dislocation from pinching, cutting, or pressure on the blood vessels or nerves (uncommon). CAUSES  Peroneal tendon dislocations and subluxations are caused by a force placed on the tendon(s) that is strong enough to displace one or both of them from their normal alignment. Common mechanisms of injury include:  A sudden stressful incident, such as forceful bend to an ankle (most often outwardly turned) against resistance by the peroneal muscles.  Severe ankle sprain.  You are born with it (congenital abnormality), such as a shallow or malformed groove for the tendons. RISK INCREASES WITH:  Snow skiing or ice skating.  Jumping sports (basketball,  gymnastics, or volleyball).  Sports in which pivoting is important (football, soccer, or lacrosse).  Exercise on uneven terrain or surfaces.  Previous foot or ankle injury.  Poor strength and flexibility. PREVENTION  Warm up and stretch properly before activity.  Maintain physical fitness:  Strength, flexibility, and endurance.  Cardiovascular fitness.  Protect the vulnerable ankle with supportive devices, such as wrapped elastic bandages, tape, braces, or high-top athletic shoes.  Avoid irregular surfaces for running or other activities.  Complete the full rehabilitation course after an ankle injury before return to practice or competition. PROGNOSIS  If treated properly, then peroneal dislocations and subluxations can be expected to heal. RELATED COMPLICATIONS   Chronic pain and disability and recurrent dislocation if activity is resumed too soon.  Rupture of the tendon due to recurrent subluxation or dislocation.  Unstable or arthritic joint following repeated injury or delayed treatment. TREATMENT   Acute dislocations and subluxations: Treatment initially involves the use of ice and medication to help reduce pain and inflammation. The use of compression bandages and elevating the foot above the level of the heart will also help reduce inflammation. A controversy exists about whether to treat these injuries with surgery or non-surgical (conservative) measures. The conservative method is to immobilize the foot and ankle for up to 6 weeks. Surgical treatment requires early intervention to repair the injured retinaculum, followed by immobilization. After immobilization it is important to perform strengthening and stretching exercises to help regain strength and a full range of motion. These exercises may be completed at home or with a therapist.  Chronic dislocations and subluxations: Treatment initially  involves the use of ice and medication to help reduce pain and inflammation.  Surgery is recommended to hold the peroneal tendons in the groove where they are suppose to be located. Surgery is followed by immobilization of the foot and ankle. After immobilization it is important to perform strengthening and stretching exercises to help regain strength and a full range of motion. These exercises may be completed at home or with a therapist. MEDICATION   If pain medication is necessary, then nonsteroidal anti-inflammatory medications, such as aspirin and ibuprofen, or other minor pain relievers, such as acetaminophen, are often recommended.  Do not take pain medication for 7 days before surgery.  Prescription pain relievers may be given if deemed necessary by your caregiver. Use only as directed and only as much as you need. COLD THERAPY  Cold treatment (icing) relieves pain and reduces inflammation. Cold treatment should be applied for 10 to 15 minutes every 2 to 3 hours for inflammation and pain and immediately after any activity that aggravates your symptoms. Use ice packs or massage the area with a piece of ice (ice massage). SEEK MEDICAL CARE IF:   Pain, tenderness, or swelling worsens despite treatment.  You experience pain, numbness, or coldness or a blue, gray or dark color appears in the toenails.  Any of the following occur after surgery: signs of infection, including fever, increased pain, swelling, redness, drainage, or bleeding in the surgical area.  New, unexplained symptoms develop (drugs used in treatment may produce side effects). EXERCISES  RANGE OF MOTION (ROM) AND STRETCHING EXERCISES - Peroneal Tendon Subluxation and Dislocation These exercises may help you when beginning to rehabilitate your injury. Your symptoms may resolve with or without further involvement from your physician, physical therapist or athletic trainer. While completing these exercises, remember:   Restoring tissue flexibility helps normal motion to return to the joints. This allows  healthier, less painful movement and activity.  An effective stretch should be held for at least 30 seconds.  A stretch should never be painful. You should only feel a gentle lengthening or release in the stretched tissue. RANGE OF MOTION - Ankle Eversion  Sit with your right / left ankle crossed over your opposite knee.  Grip your foot with your opposite hand, placing your thumb on the top of your foot and your fingers across the bottom of your foot.  Gently push your foot downward with a slight rotation so your littlest toes rise slightly  You should feel a gentle stretch on the inside of your ankle. Hold the stretch for __________ seconds. Repeat __________ times. Complete this exercise __________ times per day.  RANGE OF MOTION - Ankle Inversion  Sit with your right / left ankle crossed over your opposite knee.  Grip your foot with your opposite hand, placing your thumb on the bottom of your foot and your fingers across the top of your foot.  Gently pull your foot so the smallest toe comes toward you and your thumb pushes the inside of the ball of your foot away from you.  You should feel a gentle stretch on the outside of your ankle. Hold the stretch for __________ seconds. Repeat __________ times. Complete this exercise __________ times per day.  RANGE OF MOTION - Ankle Plantar Flexion  Sit with your right / left leg crossed over your opposite knee.  Use your opposite hand to pull the top of your foot and toes toward you.  You should feel a gentle stretch on the top of  your foot/ankle. Hold this position for __________ seconds. Repeat __________ times. Complete __________ times per day.  STRETCH - Gastroc, Standing  Place hands on wall.  Extend right / left leg, keeping the front knee somewhat bent.  Slightly point your toes inward on your back foot.  Keeping your right / left heel on the floor and your knee straight, shift your weight toward the wall, not allowing your  back to arch.  You should feel a gentle stretch in the right / left calf. Hold this position for __________ seconds. Repeat __________ times. Complete this stretch __________ times per day. STRETCH - Soleus, Standing  Place hands on wall.  Extend right / left leg, keeping the other knee somewhat bent.  Slightly point your toes inward on your back foot.  Keep your right / left heel on the floor, bend your back knee, and slightly shift your weight over the back leg so that you feel a gentle stretch deep in your back calf.  Hold this position for __________ seconds. Repeat __________ times. Complete this stretch __________ times per day. STRETCH - Gastrocsoleus, Standing  Note: This exercise can place a lot of stress on your foot and ankle. Please complete this exercise only if specifically instructed by your caregiver.   Place the ball of your right / left foot on a step, keeping your other foot firmly on the same step.  Hold on to the wall or a rail for balance.  Slowly lift your other foot, allowing your body weight to press your heel down over the edge of the step.  You should feel a stretch in your right / left calf.  Hold this position for __________ seconds.  Repeat this exercise with a slight bend in your right / left knee. Repeat __________ times. Complete this stretch __________ times per day.  STRENGTHENING EXERCISES - Peroneal Tendon Subluxation and Dislocation  These exercises may help you when beginning to rehabilitate your injury. They may resolve your symptoms with or without further involvement from your physician, physical therapist or athletic trainer. While completing these exercises, remember:   Muscles can gain both the endurance and the strength needed for everyday activities through controlled exercises.  Complete these exercises as instructed by your physician, physical therapist or athletic trainer. Progress the resistance and repetitions only as  guided. STRENGTH - Ankle Eversion  Secure one end of a rubber exercise band/tubing to a fixed object (table, pole). Loop the other end around your foot just before your toes.  Place your fists between your knees. This will focus your strengthening at your ankle.  Drawing the band/tubing across your opposite foot, slowly, pull your little toe out and up. Make sure the band/tubing is positioned to resist the entire motion.  Hold this position for __________ seconds.  Have your muscles resist the band/tubing as it slowly pulls your foot back to the starting position. Repeat __________ times. Complete this exercise __________ times per day.  STRENGTH - Ankle Inversion  Secure one end of a rubber exercise band/tubing to a fixed object (table, pole). Loop the other end around your foot just before your toes.  Place your fists between your knees. This will focus your strengthening at your ankle.  Slowly, pull your big toe up and in, making sure the band/tubing is positioned to resist the entire motion.  Hold this position for __________ seconds.  Have your muscles resist the band/tubing as it slowly pulls your foot back to the starting position. Repeat __________  times. Complete this exercises __________ times per day.  STRENGTH - Towel Curls  Sit in a chair positioned on a non-carpeted surface.  Place your foot on a towel, keeping your heel on the floor.  Pull the towel toward your heel by only curling your toes. Keep your heel on the floor.  If instructed by your physician, physical therapist or athletic trainer, add ____________________ at the end of the towel. Repeat __________ times. Complete this exercise __________ times per day.   This information is not intended to replace advice given to you by your health care provider. Make sure you discuss any questions you have with your health care provider.   Document Released: 10/01/2005 Document Revised: 02/15/2015 Document Reviewed:  01/13/2009 Elsevier Interactive Patient Education 2016 [place]. Achilles Tendinitis With Rehab Achilles tendinitis is a disorder of the Achilles tendon. The Achilles tendon connects the large calf muscles (Gastrocnemius and Soleus) to the heel bone (calcaneus). This tendon is sometimes called the heel cord. It is important for pushing-off and standing on your toes and is important for walking, running, or jumping. Tendinitis is often caused by overuse and repetitive microtrauma. SYMPTOMS  Pain, tenderness, swelling, warmth, and redness may occur over the Achilles tendon even at rest.  Pain with pushing off, or flexing or extending the ankle.  Pain that is worsened after or during activity. CAUSES   Overuse sometimes seen with rapid increase in exercise programs or in sports requiring running and jumping.  Poor physical conditioning (strength and flexibility or endurance).  Running sports, especially training running down hills.  Inadequate warm-up before practice or play or failure to stretch before participation.  Injury to the tendon. PREVENTION   Warm up and stretch before practice or competition.  Allow time for adequate rest and recovery between practices and competition.  Keep up conditioning.  Keep up ankle and leg flexibility.  Improve or keep muscle strength and endurance.  Improve cardiovascular fitness.  Use proper technique.  Use proper equipment (shoes, skates).  To help prevent recurrence, taping, protective strapping, or an adhesive bandage may be recommended for several weeks after healing is complete. PROGNOSIS   Recovery may take weeks to several months to heal.  Longer recovery is expected if symptoms have been prolonged.  Recovery is usually quicker if the inflammation is due to a direct blow as compared with overuse or sudden strain. RELATED COMPLICATIONS   Healing time will be prolonged if the condition is not correctly treated. The injury  must be given plenty of time to heal.  Symptoms can reoccur if activity is resumed too soon.  Untreated, tendinitis may increase the risk of tendon rupture requiring additional time for recovery and possibly surgery. TREATMENT   The first treatment consists of rest anti-inflammatory medication, and ice to relieve the pain.  Stretching and strengthening exercises after resolution of pain will likely help reduce the risk of recurrence. Referral to a physical therapist or athletic trainer for further evaluation and treatment may be helpful.  A walking boot or cast may be recommended to rest the Achilles tendon. This can help break the cycle of inflammation and microtrauma.  Arch supports (orthotics) may be prescribed or recommended by your caregiver as an adjunct to therapy and rest.  Surgery to remove the inflamed tendon lining or degenerated tendon tissue is rarely necessary and has shown less than predictable results. MEDICATION   Nonsteroidal anti-inflammatory medications, such as aspirin and ibuprofen, may be used for pain and inflammation relief. Do not  take within 7 days before surgery. Take these as directed by your caregiver. Contact your caregiver immediately if any bleeding, stomach upset, or signs of allergic reaction occur. Other minor pain relievers, such as acetaminophen, may also be used.  Pain relievers may be prescribed as necessary by your caregiver. Do not take prescription pain medication for longer than 4 to 7 days. Use only as directed and only as much as you need.  Cortisone injections are rarely indicated. Cortisone injections may weaken tendons and predispose to rupture. It is better to give the condition more time to heal than to use them. HEAT AND COLD  Cold is used to relieve pain and reduce inflammation for acute and chronic Achilles tendinitis. Cold should be applied for 10 to 15 minutes every 2 to 3 hours for inflammation and pain and immediately after any  activity that aggravates your symptoms. Use ice packs or an ice massage.  Heat may be used before performing stretching and strengthening activities prescribed by your caregiver. Use a heat pack or a warm soak. SEEK MEDICAL CARE IF:  Symptoms get worse or do not improve in 2 weeks despite treatment.  New, unexplained symptoms develop. Drugs used in treatment may produce side effects. EXERCISES RANGE OF MOTION (ROM) AND STRETCHING EXERCISES - Achilles Tendinitis  These exercises may help you when beginning to rehabilitate your injury. Your symptoms may resolve with or without further involvement from your physician, physical therapist or athletic trainer. While completing these exercises, remember:   Restoring tissue flexibility helps normal motion to return to the joints. This allows healthier, less painful movement and activity.  An effective stretch should be held for at least 30 seconds.  A stretch should never be painful. You should only feel a gentle lengthening or release in the stretched tissue. STRETCH - Gastroc, Standing   Place hands on wall.  Extend right / left leg, keeping the front knee somewhat bent.  Slightly point your toes inward on your back foot.  Keeping your right / left heel on the floor and your knee straight, shift your weight toward the wall, not allowing your back to arch.  You should feel a gentle stretch in the right / left calf. Hold this position for __________ seconds. Repeat __________ times. Complete this stretch __________ times per day. STRETCH - Soleus, Standing   Place hands on wall.  Extend right / left leg, keeping the other knee somewhat bent.  Slightly point your toes inward on your back foot.  Keep your right / left heel on the floor, bend your back knee, and slightly shift your weight over the back leg so that you feel a gentle stretch deep in your back calf.  Hold this position for __________ seconds. Repeat __________ times.  Complete this stretch __________ times per day. STRETCH - Gastrocsoleus, Standing  Note: This exercise can place a lot of stress on your foot and ankle. Please complete this exercise only if specifically instructed by your caregiver.   Place the ball of your right / left foot on a step, keeping your other foot firmly on the same step.  Hold on to the wall or a rail for balance.  Slowly lift your other foot, allowing your body weight to press your heel down over the edge of the step.  You should feel a stretch in your right / left calf.  Hold this position for __________ seconds.  Repeat this exercise with a slight bend in your knee. Repeat __________ times. Complete  this stretch __________ times per day.  STRENGTHENING EXERCISES - Achilles Tendinitis These exercises may help you when beginning to rehabilitate your injury. They may resolve your symptoms with or without further involvement from your physician, physical therapist or athletic trainer. While completing these exercises, remember:   Muscles can gain both the endurance and the strength needed for everyday activities through controlled exercises.  Complete these exercises as instructed by your physician, physical therapist or athletic trainer. Progress the resistance and repetitions only as guided.  You may experience muscle soreness or fatigue, but the pain or discomfort you are trying to eliminate should never worsen during these exercises. If this pain does worsen, stop and make certain you are following the directions exactly. If the pain is still present after adjustments, discontinue the exercise until you can discuss the trouble with your clinician. STRENGTH - Plantar-flexors   Sit with your right / left leg extended. Holding onto both ends of a rubber exercise band/tubing, loop it around the ball of your foot. Keep a slight tension in the band.  Slowly push your toes away from you, pointing them downward.  Hold this  position for __________ seconds. Return slowly, controlling the tension in the band/tubing. Repeat __________ times. Complete this exercise __________ times per day.  STRENGTH - Plantar-flexors   Stand with your feet shoulder width apart. Steady yourself with a wall or table using as little support as needed.  Keeping your weight evenly spread over the width of your feet, rise up on your toes.*  Hold this position for __________ seconds. Repeat __________ times. Complete this exercise __________ times per day.  *If this is too easy, shift your weight toward your right / left leg until you feel challenged. Ultimately, you may be asked to do this exercise with your right / left foot only. STRENGTH - Plantar-flexors, Eccentric  Note: This exercise can place a lot of stress on your foot and ankle. Please complete this exercise only if specifically instructed by your caregiver.   Place the balls of your feet on a step. With your hands, use only enough support from a wall or rail to keep your balance.  Keep your knees straight and rise up on your toes.  Slowly shift your weight entirely to your right / left toes and pick up your opposite foot. Gently and with controlled movement, lower your weight through your right / left foot so that your heel drops below the level of the step. You will feel a slight stretch in the back of your calf at the end position.  Use the healthy leg to help rise up onto the balls of both feet, then lower weight only on the right / left leg again. Build up to 15 repetitions. Then progress to 3 consecutive sets of 15 repetitions.*  After completing the above exercise, complete the same exercise with a slight knee bend (about 30 degrees). Again, build up to 15 repetitions. Then progress to 3 consecutive sets of 15 repetitions.* Perform this exercise __________ times per day.  *When you easily complete 3 sets of 15, your physician, physical therapist or athletic trainer may  advise you to add resistance by wearing a backpack filled with additional weight. STRENGTH - Plantar Flexors, Seated   Sit on a chair that allows your feet to rest flat on the ground. If necessary, sit at the edge of the chair.  Keeping your toes firmly on the ground, lift your right / left heel as far as you  can without increasing any discomfort in your ankle. Repeat __________ times. Complete this exercise __________ times a day. *If instructed by your physician, physical therapist or athletic trainer, you may add ____________________ of resistance by placing a weighted object on your right / left knee.   This information is not intended to replace advice given to you by your health care provider. Make sure you discuss any questions you have with your health care provider.   Document Released: 05/02/2005 Document Revised: 10/22/2014 Document Reviewed: 01/13/2009 Else[employer] Interactive Patient Education [employer].

## 2015-08-05 ENCOUNTER — Ambulatory Visit: Payer: Self-pay | Admitting: Dietician

## 2015-08-23 ENCOUNTER — Ambulatory Visit: Payer: Self-pay | Attending: Family Medicine | Admitting: Family Medicine

## 2015-08-23 ENCOUNTER — Encounter: Payer: Self-pay | Admitting: Family Medicine

## 2015-08-23 VITALS — BP 107/78 | HR 75 | Temp 98.6°F | Resp 16 | Ht 66.0 in | Wt 164.0 lb

## 2015-08-23 DIAGNOSIS — M545 Low back pain, unspecified: Secondary | ICD-10-CM

## 2015-08-23 DIAGNOSIS — R7303 Prediabetes: Secondary | ICD-10-CM

## 2015-08-23 LAB — GLUCOSE, POCT (MANUAL RESULT ENTRY): POC GLUCOSE: 112 mg/dL — AB (ref 70–99)

## 2015-08-23 MED ORDER — PREDNISONE 20 MG PO TABS: 40.0000 mg | ORAL_TABLET | Freq: Every day | ORAL | Status: DC

## 2015-08-23 MED ORDER — CYCLOBENZAPRINE HCL 10 MG PO TABS: 10.0000 mg | ORAL_TABLET | Freq: Three times a day (TID) | ORAL | Status: DC | PRN

## 2015-08-23 NOTE — Progress Notes (Signed)
Patient ID: Jennifer Jones, female   DOB: [birth year], 39 y.o.   MRN: [id number]   Subjective:  Patient ID: Jennifer Jones, female    DOB: [birth year]  Age: 39 y.o. MRN: [id number]  CC: Back Pain   HPI Jennifer Jones presents for   1. Back pain: recurrent back pain started yesterday around noon. Low back. B/l. Worse on L. Non radiating. No fecal or urinary incontinence. No fever, chills, abdominal pain, dysuria. Has hx of lumbar disc disease. Took tramadol and naproxen w/o relief of pain.    Social History  Substance Use Topics  . Smoking status: Never Smoker   . Smokeless tobacco: Never Used  . Alcohol Use: No    Outpatient Prescriptions Prior to Visit  Medication Sig Dispense Refill  . Blood Glucose Monitoring Suppl (TRUE METRIX METER) W/DEVICE KIT Used as instructed 1 kit 0  . cholecalciferol (VITAMIN D) 1000 UNITS tablet Take 1,000 Units by mouth daily.     . fluocinonide (LIDEX) 0.05 % external solution Apply 1 application topically 2 (two) times daily. 60 mL 0  . glucose blood (TRUE METRIX BLOOD GLUCOSE TEST) test strip 1 each by Other route 3 (three) times daily. 100 each 11  . levonorgestrel (MIRENA) 20 MCG/24HR IUD 1 each by Intrauterine route once.    . loratadine (CLARITIN) 10 MG tablet Take 10 mg by mouth daily.    . metFORMIN (GLUCOPHAGE) 500 MG tablet Take 1 tablet (500 mg total) by mouth 2 (two) times daily with a meal. 180 tablet 3  . naproxen (NAPROSYN) 500 MG tablet Take 1 tablet (500 mg total) by mouth 2 (two) times daily as needed (with food). 60 tablet 0  . simvastatin (ZOCOR) 10 MG tablet Take 1 tablet (10 mg total) by mouth at bedtime. Needs OVfor future refills 30 tablet 1   No facility-administered medications prior to visit.    ROS Review of Systems  Constitutional: Negative for fever and chills.  Eyes: Negative for visual disturbance.  Respiratory: Negative for shortness of breath.   Cardiovascular: Negative for chest pain.  Gastrointestinal:  Negative for abdominal pain and blood in stool.  Musculoskeletal: Positive for back pain. Negative for arthralgias.  Skin: Negative for rash.  Allergic/Immunologic: Negative for immunocompromised state.  Hematological: Negative for adenopathy. Does not bruise/bleed easily.  Psychiatric/Behavioral: Negative for suicidal ideas and dysphoric mood.    Objective:  BP 107/78 mmHg  Pulse 75  Temp(Src) 98.6 F (37 C) (Oral)  Resp 16  Ht _0  (1.676 m)  Wt 164 lb (74.39 kg)  BMI 26.48 kg/m2  SpO2 97%  BP/Weight 08/23/2015 08/01/2015 8/88/9169  Systolic BP 450 388 828  Diastolic BP 78 74 69  Wt. (Lbs) 164 - 165  BMI 26.48 - 26.64    Physical Exam  Constitutional: She is oriented to person, place, and time. She appears well-developed and well-nourished. No distress.  HENT:  Head: Normocephalic and atraumatic.  Pulmonary/Chest: Effort normal.  Musculoskeletal: She exhibits no edema.  Back Exam: Back: Normal Curvature, no deformities or CVA tenderness  Paraspinal Tenderness: L lumbar at L5/S1 with muscle tenderness   LE Strength 5/5  LE Sensation: in tact  LE Reflexes 2+ and symmetric  Straight leg raise: negative   Neurological: She is alert and oriented to person, place, and time.  Skin: Skin is warm and dry. No rash noted.  Psychiatric: She has a normal mood and affect.    Lab Results  Component Value Date   HGBA1C 6.3* 06/23/2015  CBG 112  Assessment & Plan:   Problem List Items Addressed This Visit    Acute bilateral low back pain without sciatica - Primary   Relevant Medications   predniSONE (DELTASONE) 20 MG tablet   cyclobenzaprine (FLEXERIL) 10 MG tablet   Prediabetes (Chronic)   Relevant Orders   POCT glucose (manual entry) (Completed)      No orders of the defined types were placed in this encounter.    Follow-up: No Follow-up on file.   Boykin Nearing MD

## 2015-08-23 NOTE — Patient Instructions (Addendum)
Jennifer Jones was seen today for back pain.  Diagnoses and all orders for this visit:  Acute bilateral low back pain without sciatica -     predniSONE (DELTASONE) 20 MG tablet; Take 2 tablets (40 mg total) by mouth daily with breakfast. -     cyclobenzaprine (FLEXERIL) 10 MG tablet; Take 1 tablet (10 mg total) by mouth 3 (three) times daily as needed for muscle spasms.  Prediabetes -     POCT glucose (manual entry)    F/u in 3 weeks for back pain  Dr. Adrian Blackwater  Back Exercises If you have pain in your back, do these exercises 2-3 times each day or as told by your doctor. When the pain goes away, do the exercises once each day, but repeat the steps more times for each exercise (do more repetitions). If you do not have pain in your back, do these exercises once each day or as told by your doctor. EXERCISES Single Knee to Chest Do these steps 3-5 times in a row for each leg: 1. Lie on your back on a firm bed or the floor with your legs stretched out. 2. Bring one knee to your chest. 3. Hold your knee to your chest by grabbing your knee or thigh. 4. Pull on your knee until you feel a gentle stretch in your lower back. 5. Keep doing the stretch for 10-30 seconds. 6. Slowly let go of your leg and straighten it. Pelvic Tilt Do these steps 5-10 times in a row: 1. Lie on your back on a firm bed or the floor with your legs stretched out. 2. Bend your knees so they point up to the ceiling. Your feet should be flat on the floor. 3. Tighten your lower belly (abdomen) muscles to press your lower back against the floor. This will make your tailbone point up to the ceiling instead of pointing down to your feet or the floor. 4. Stay in this position for 5-10 seconds while you gently tighten your muscles and breathe evenly. Cat-Cow Do these steps until your lower back bends more easily: 1. Get on your hands and knees on a firm surface. Keep your hands under your shoulders, and keep your knees under your  hips. You may put padding under your knees. 2. Let your head hang down, and make your tailbone point down to the floor so your lower back is round like the back of a cat. 3. Stay in this position for 5 seconds. 4. Slowly lift your head and make your tailbone point up to the ceiling so your back hangs low (sags) like the back of a cow. 5. Stay in this position for 5 seconds. Press-Ups Do these steps 5-10 times in a row: 1. Lie on your belly (face-down) on the floor. 2. Place your hands near your head, about shoulder-width apart. 3. While you keep your back relaxed and keep your hips on the floor, slowly straighten your arms to raise the top half of your body and lift your shoulders. Do not use your back muscles. To make yourself more comfortable, you may change where you place your hands. 4. Stay in this position for 5 seconds. 5. Slowly return to lying flat on the floor. Bridges Do these steps 10 times in a row: 1. Lie on your back on a firm surface. 2. Bend your knees so they point up to the ceiling. Your feet should be flat on the floor. 3. Tighten your butt muscles and lift your butt off of  the floor until your waist is almost as high as your knees. If you do not feel the muscles working in your butt and the back of your thighs, slide your feet 1-2 inches farther away from your butt. 4. Stay in this position for 3-5 seconds. 5. Slowly lower your butt to the floor, and let your butt muscles relax. If this exercise is too easy, try doing it with your arms crossed over your chest. Belly Crunches Do these steps 5-10 times in a row: 1. Lie on your back on a firm bed or the floor with your legs stretched out. 2. Bend your knees so they point up to the ceiling. Your feet should be flat on the floor. 3. Cross your arms over your chest. 4. Tip your chin a little bit toward your chest but do not bend your neck. 5. Tighten your belly muscles and slowly raise your chest just enough to lift your  shoulder blades a tiny bit off of the floor. 6. Slowly lower your chest and your head to the floor. Back Lifts Do these steps 5-10 times in a row: 1. Lie on your belly (face-down) with your arms at your sides, and rest your forehead on the floor. 2. Tighten the muscles in your legs and your butt. 3. Slowly lift your chest off of the floor while you keep your hips on the floor. Keep the back of your head in line with the curve in your back. Look at the floor while you do this. 4. Stay in this position for 3-5 seconds. 5. Slowly lower your chest and your face to the floor. GET HELP IF:  Your back pain gets a lot worse when you do an exercise.  Your back pain does not lessen 2 hours after you exercise. If you have any of these problems, stop doing the exercises. Do not do them again unless your doctor says it is okay. GET HELP RIGHT AWAY IF:  You have sudden, very bad back pain. If this happens, stop doing the exercises. Do not do them again unless your doctor says it is okay.   This information is not intended to replace advice given to you by your health care provider. Make sure you discuss any questions you have with your health care provider.   Document Released: 11/03/2010 Document Revised: 06/22/2015 Document Reviewed: 11/25/2014 Elsevier Interactive Patient Education [employer].

## 2015-08-23 NOTE — Progress Notes (Signed)
C/C back pain x 2 days Pain scale #10 Hard to walk and stand up straight

## 2015-08-26 ENCOUNTER — Other Ambulatory Visit: Payer: Self-pay | Admitting: *Deleted

## 2015-08-26 MED ORDER — GLUCOSE BLOOD VI STRP: ORAL_STRIP | Status: DC

## 2015-08-26 NOTE — Telephone Encounter (Signed)
Patient has 12 refills on test strips.

## 2015-09-12 ENCOUNTER — Ambulatory Visit: Payer: No Typology Code available for payment source | Admitting: Podiatry

## 2015-09-13 ENCOUNTER — Ambulatory Visit: Payer: Self-pay | Admitting: Dietician

## 2015-09-21 ENCOUNTER — Telehealth: Payer: Self-pay | Admitting: Family Medicine

## 2015-09-21 ENCOUNTER — Ambulatory Visit: Payer: Self-pay | Attending: Family Medicine

## 2015-09-21 DIAGNOSIS — M549 Dorsalgia, unspecified: Secondary | ICD-10-CM | POA: Insufficient documentation

## 2015-09-21 NOTE — Telephone Encounter (Signed)
MRI of low back ordered, it may be denied giving length of pain only 4 weeks We will attempt to schedule and see  Patient will need f/u for repeat exam of low back

## 2015-09-21 NOTE — Telephone Encounter (Signed)
Patient is requesting a referral for an MRI, Patient states that she had discussed with the doctor an MRI during last doctor visit. Please follow up

## 2015-09-21 NOTE — Telephone Encounter (Signed)
LVM to return call  MRI appointment on 10/04/2016 at 09:00am at [facility] Please arrived 15 min early

## 2015-09-22 NOTE — Telephone Encounter (Signed)
Pt. Was informed of MRI appt. On 10/05/2015 at 9 a.m. At [facility].

## 2015-10-04 ENCOUNTER — Other Ambulatory Visit: Payer: Self-pay | Admitting: Family Medicine

## 2015-10-05 ENCOUNTER — Ambulatory Visit (HOSPITAL_COMMUNITY)
Admission: RE | Admit: 2015-10-05 | Discharge: 2015-10-05 | Disposition: A | Discharge status: Home / Self Care | Payer: Self-pay | Source: Ambulatory Visit | Attending: Family Medicine | Admitting: Family Medicine

## 2015-10-05 ENCOUNTER — Other Ambulatory Visit: Payer: Self-pay | Admitting: Family Medicine

## 2015-10-05 DIAGNOSIS — M5137 Other intervertebral disc degeneration, lumbosacral region: Secondary | ICD-10-CM | POA: Insufficient documentation

## 2015-10-05 DIAGNOSIS — M549 Dorsalgia, unspecified: Secondary | ICD-10-CM

## 2015-10-05 DIAGNOSIS — M545 Low back pain, unspecified: Secondary | ICD-10-CM

## 2015-10-05 DIAGNOSIS — G8929 Other chronic pain: Secondary | ICD-10-CM | POA: Insufficient documentation

## 2015-10-05 DIAGNOSIS — M4806 Spinal stenosis, lumbar region: Secondary | ICD-10-CM | POA: Insufficient documentation

## 2015-10-05 DIAGNOSIS — M5126 Other intervertebral disc displacement, lumbar region: Secondary | ICD-10-CM | POA: Insufficient documentation

## 2015-10-11 ENCOUNTER — Other Ambulatory Visit: Payer: Self-pay | Admitting: Pharmacist

## 2015-10-11 ENCOUNTER — Telehealth: Payer: Self-pay | Admitting: Family Medicine

## 2015-10-11 MED ORDER — TRUEPLUS LANCETS 28G MISC: Status: DC

## 2015-10-11 MED ORDER — GLUCOSE BLOOD VI STRP: ORAL_STRIP | Status: DC

## 2015-10-11 NOTE — Telephone Encounter (Signed)
Patient called requesting MRI results. Patient would also like to know why she has to see a Chief of Staff. Please follow up with patient.

## 2015-10-12 NOTE — Telephone Encounter (Signed)
-----   Message from Boykin Nearing, MD sent at 10/05/2015  2:34 PM EST ----- Patient does have L3-L4 herniated disc to L side Neurosurgery referral placed

## 2015-10-12 NOTE — Telephone Encounter (Signed)
Date of birth verified by pt  MRI results given  L3 L4  Herniated disc to L side  Pt aware of referral  Verbalized understanding

## 2015-10-26 ENCOUNTER — Telehealth: Payer: Self-pay | Admitting: *Deleted

## 2015-10-26 NOTE — Telephone Encounter (Signed)
Pt complaining of dry cough and scratchy throat What she need to do? Requesting medication Rx

## 2015-10-27 NOTE — Telephone Encounter (Signed)
Patient verified DOB Patient advised to use lozenges or sore throat relief drops for her symptoms. Patient also advised to pick up zyrtec or claritin OTC. Patient advised to return a phone call to the office if her symptoms do not decrease over the weekend. Patient expressed her understanding and had no further questions.

## 2015-10-27 NOTE — Telephone Encounter (Signed)
LVM to return call.

## 2015-10-27 NOTE — Telephone Encounter (Signed)
Recommend lozenges/sore throat relief drops and claritin or zyrtec  OTC meds  OV needed to determine of Rx med needed

## 2015-11-08 ENCOUNTER — Other Ambulatory Visit: Payer: Self-pay | Admitting: Family Medicine

## 2015-11-08 MED FILL — ?SIMVASTATIN 10 MG TABLET: 10 MG | 30 days supply | Qty: 30 | Fill #0

## 2015-11-08 MED FILL — ?METFORMIN HCL 500MG TABLET: 500 | 30 days supply | Qty: 60 | Fill #0

## 2015-11-08 MED FILL — TRUE METRIX GLUCOSE TEST ST: 25 days supply | Qty: 100 | Fill #0

## 2015-12-05 ENCOUNTER — Ambulatory Visit: Payer: Self-pay | Attending: Family Medicine | Admitting: Family Medicine

## 2015-12-05 ENCOUNTER — Encounter: Payer: Self-pay | Admitting: Family Medicine

## 2015-12-05 VITALS — BP 111/73 | HR 81 | Temp 98.0°F | Resp 16 | Ht 63.0 in | Wt 160.0 lb

## 2015-12-05 DIAGNOSIS — E1165 Type 2 diabetes mellitus with hyperglycemia: Secondary | ICD-10-CM | POA: Insufficient documentation

## 2015-12-05 DIAGNOSIS — K644 Residual hemorrhoidal skin tags: Secondary | ICD-10-CM | POA: Insufficient documentation

## 2015-12-05 DIAGNOSIS — G8929 Other chronic pain: Secondary | ICD-10-CM | POA: Insufficient documentation

## 2015-12-05 DIAGNOSIS — Z7984 Long term (current) use of oral hypoglycemic drugs: Secondary | ICD-10-CM | POA: Insufficient documentation

## 2015-12-05 DIAGNOSIS — R739 Hyperglycemia, unspecified: Secondary | ICD-10-CM

## 2015-12-05 DIAGNOSIS — K648 Other hemorrhoids: Secondary | ICD-10-CM

## 2015-12-05 DIAGNOSIS — Z79899 Other long term (current) drug therapy: Secondary | ICD-10-CM | POA: Insufficient documentation

## 2015-12-05 DIAGNOSIS — R7303 Prediabetes: Secondary | ICD-10-CM

## 2015-12-05 DIAGNOSIS — K649 Unspecified hemorrhoids: Secondary | ICD-10-CM | POA: Insufficient documentation

## 2015-12-05 DIAGNOSIS — M549 Dorsalgia, unspecified: Secondary | ICD-10-CM

## 2015-12-05 DIAGNOSIS — Z30431 Encounter for routine checking of intrauterine contraceptive device: Secondary | ICD-10-CM | POA: Insufficient documentation

## 2015-12-05 LAB — POCT GLYCOSYLATED HEMOGLOBIN (HGB A1C): HEMOGLOBIN A1C: 5.6

## 2015-12-05 LAB — GLUCOSE, POCT (MANUAL RESULT ENTRY): POC Glucose: 193 mg/dl — AB (ref 70–99)

## 2015-12-05 MED ORDER — HYDROCORTISONE ACE-PRAMOXINE 1-1 % RE FOAM: 1.0000 | Freq: Two times a day (BID) | RECTAL | Status: DC

## 2015-12-05 MED ORDER — HYDROCORTISONE 2.5 % RE CREA: 1.0000 "application " | TOPICAL_CREAM | Freq: Two times a day (BID) | RECTAL | Status: DC

## 2015-12-05 NOTE — Progress Notes (Signed)
Subjective:  Patient ID: Jennifer Jones, female    DOB: February 06, 1976  Age: 40 y.o. MRN: [id number]  CC: Diabetes   HPI Albert Devaul presents for    1. CHRONIC DIABETES  Disease Monitoring  Blood Sugar Ranges:57-160  Polyuria: no   Visual problems: no   Medication Compliance: yes  Medication Side Effects  Hypoglycemia: yes, 57 one time    2. Acute on chronic back pain: improved. She saw the neurosurgeon at [facility]. Surgery not recommended. PT recommended. She has been doing home exercises and gym 2 times weekly. Pain is 4/10. L low back.   3. Hemorrhoids: recurrent. Painful. Request proctofoam HC as she has tried this in the past and it worked well for her.   4. IUD: would like a new IUD. Due for removal. Uninsured.   5. R great toe pain: sharp pains comes and go. No injury. No redness or swelling. No deformity.   Social History  Substance Use Topics  . Smoking status: Never Smoker   . Smokeless tobacco: Never Used  . Alcohol Use: No    Outpatient Prescriptions Prior to Visit  Medication Sig Dispense Refill  . Blood Glucose Monitoring Suppl (TRUE METRIX METER) W/DEVICE KIT Used as instructed 1 kit 0  . cholecalciferol (VITAMIN D) 1000 UNITS tablet Take 1,000 Units by mouth daily.     . cyclobenzaprine (FLEXERIL) 10 MG tablet Take 1 tablet (10 mg total) by mouth 3 (three) times daily as needed for muscle spasms. 30 tablet 0  . glucose blood (TRUE METRIX BLOOD GLUCOSE TEST) test strip Use as instructed 100 each 12  . levonorgestrel (MIRENA) 20 MCG/24HR IUD 1 each by Intrauterine route once.    . loratadine (CLARITIN) 10 MG tablet Take 10 mg by mouth daily.    . metFORMIN (GLUCOPHAGE) 500 MG tablet Take 1 tablet (500 mg total) by mouth 2 (two) times daily with a meal. 180 tablet 3  . naproxen (NAPROSYN) 500 MG tablet Take 1 tablet (500 mg total) by mouth 2 (two) times daily as needed (with food). 60 tablet 0  . simvastatin (ZOCOR) 10 MG tablet Take 1 tablet (10 mg total)  by mouth at bedtime. 30 tablet 2  . TRUEPLUS LANCETS 28G MISC Use as directed 100 each 12  . fluocinonide (LIDEX) 0.05 % external solution Apply 1 application topically 2 (two) times daily. (Patient not taking: Reported on 12/05/2015) 60 mL 0  . predniSONE (DELTASONE) 20 MG tablet Take 2 tablets (40 mg total) by mouth daily with breakfast. (Patient not taking: Reported on 12/05/2015) 10 tablet 0   No facility-administered medications prior to visit.    ROS Review of Systems  Constitutional: Negative for fever and chills.  Eyes: Negative for visual disturbance.  Respiratory: Negative for shortness of breath.   Cardiovascular: Negative for chest pain.  Gastrointestinal: Positive for rectal pain. Negative for abdominal pain and blood in stool.  Musculoskeletal: Negative for back pain and arthralgias.  Skin: Negative for rash.  Allergic/Immunologic: Negative for immunocompromised state.  Hematological: Negative for adenopathy. Does not bruise/bleed easily.  Psychiatric/Behavioral: Negative for suicidal ideas and dysphoric mood.    Objective:  BP 111/73 mmHg  Pulse 81  Temp(Src) 98 F (36.7 C) (Oral)  Resp 16  Ht _0  (1.6 m)  Wt 160 lb (72.576 kg)  BMI 28.35 kg/m2  SpO2 98%  BP/Weight 12/05/2015 08/23/2015 33/54/5625  Systolic BP 638 937 342  Diastolic BP 73 78 74  Wt. (Lbs) 160 164 -  BMI  28.35 26.48 -   Physical Exam  Constitutional: She is oriented to person, place, and time. She appears well-developed and well-nourished. No distress.  HENT:  Head: Normocephalic and atraumatic.  Cardiovascular: Normal rate, regular rhythm, normal heart sounds and intact distal pulses.   Pulmonary/Chest: Effort normal and breath sounds normal.  Musculoskeletal: She exhibits no edema.  Neurological: She is alert and oriented to person, place, and time.  Skin: Skin is warm and dry. No rash noted.  Psychiatric: She has a normal mood and affect.   Lab Results  Component Value Date   HGBA1C  5.60 12/05/2015   Depression screen [facility] 2/9 12/05/2015 08/23/2015 07/08/2015  Decreased Interest - 0 0  Down, Depressed, Hopeless 0 0 0  PHQ - 2 Score 0 0 0  Altered sleeping 0 - -  Tired, decreased energy 1 - -  Change in appetite 1 - -  Feeling bad or failure about yourself  0 - -  Trouble concentrating 0 - -  Moving slowly or fidgety/restless 0 - -  Suicidal thoughts 0 - -  PHQ-9 Score 2 - -   GAD 7 : Generalized Anxiety Score 12/05/2015  Nervous, Anxious, on Edge 3  Control/stop worrying 2  Worry too much - different things 3  Trouble relaxing 3  Restless 3  Easily annoyed or irritable 3  Afraid - awful might happen 0  Total GAD 7 Score 17      CBG 193 Assessment & Plan:   Shyann was seen today for diabetes.  Diagnoses and all orders for this visit:  Prediabetes -     POCT glycosylated hemoglobin (Hb A1C) -     POCT glucose (manual entry)  Hyperglycemia  Subacute back pain -     Ambulatory referral to Physical Therapy  External hemorrhoids -     hydrocortisone (ANUSOL-HC) 2.5 % rectal cream; Place 1 application rectally 2 (two) times daily. -     hydrocortisone-pramoxine (PROCTOFOAM HC) rectal foam; Place 1 applicator rectally 2 (two) times daily.    No orders of the defined types were placed in this encounter.    Follow-up: No Follow-up on file.   Boykin Nearing MD

## 2015-12-05 NOTE — Patient Instructions (Addendum)
Jennifer Jones was seen today for diabetes.  Diagnoses and all orders for this visit:  Hyperglycemia -     POCT glycosylated hemoglobin (Hb A1C) -     POCT glucose (manual entry)  Subacute back pain -     Ambulatory referral to Physical Therapy  External hemorrhoids -     hydrocortisone (ANUSOL-HC) 2.5 % rectal cream; Place 1 application rectally 2 (two) times daily. -     hydrocortisone-pramoxine (PROCTOFOAM HC) rectal foam; Place 1 applicator rectally 2 (two) times daily.   We do not have proctofoam in the pharmacy, we do have anusol/hydrocortisone cream. I have ordered both. You can shop around with the proctofoam prescription.  F/u in 6-8 weeks for physical with Mirena change   Dr. Adrian Blackwater

## 2015-12-05 NOTE — Assessment & Plan Note (Signed)
Well controlled with normal A1c

## 2015-12-05 NOTE — Progress Notes (Signed)
F/U DM Glucose running 57-160 Tanking medication as prescribed Pain scale #4 back pain No suicidal thoughts in the past two weeks

## 2015-12-06 MED ORDER — HYDROCORTISONE ACE-PRAMOXINE 2.5-1 % RE CREA: 1.0000 "application " | TOPICAL_CREAM | Freq: Three times a day (TID) | RECTAL | Status: DC

## 2015-12-06 MED FILL — HYDROCORT-PRAMOXINE 2.5-1%: 2.5-1 | 15 days supply | Qty: 30 | Fill #0

## 2015-12-06 NOTE — Addendum Note (Signed)
Addended by: Boykin Nearing on: 12/06/2015 03:40 PM   Modules accepted: Orders

## 2015-12-07 ENCOUNTER — Encounter: Payer: Self-pay | Admitting: Clinical

## 2015-12-07 NOTE — Progress Notes (Signed)
Depression screen [facility] 2/9 12/05/2015 08/23/2015 07/08/2015 06/30/2015 12/03/2014  Decreased Interest - 0 0 0 0  Down, Depressed, Hopeless 0 0 0 0 0  PHQ - 2 Score 0 0 0 0 0  Altered sleeping 0 - - - -  Tired, decreased energy 1 - - - -  Change in appetite 1 - - - -  Feeling bad or failure about yourself  0 - - - -  Trouble concentrating 0 - - - -  Moving slowly or fidgety/restless 0 - - - -  Suicidal thoughts 0 - - - -  PHQ-9 Score 2 - - - -    GAD 7 : Generalized Anxiety Score 12/05/2015  Nervous, Anxious, on Edge 3  Control/stop worrying 2  Worry too much - different things 3  Trouble relaxing 3  Restless 3  Easily annoyed or irritable 3  Afraid - awful might happen 0  Total GAD 7 Score 17

## 2015-12-08 LAB — HM DIABETES EYE EXAM

## 2015-12-12 ENCOUNTER — Telehealth: Payer: Self-pay | Admitting: Family Medicine

## 2015-12-12 NOTE — Telephone Encounter (Signed)
Patient dropped off paperwork to be completed by doctor. Mirena Please follow up.

## 2015-12-13 NOTE — Telephone Encounter (Signed)
Paperwork completed. Please fax along with patient's proof of income (pay stubs)

## 2015-12-13 NOTE — Telephone Encounter (Signed)
Paperwork faxed and mail to [facility]

## 2015-12-15 ENCOUNTER — Telehealth: Payer: Self-pay | Admitting: Family Medicine

## 2015-12-15 NOTE — Telephone Encounter (Signed)
Patient called requesting to speak to nurse , patient has a question on an immunization. Please f/u

## 2015-12-19 MED FILL — metFORMIN HCL 500 MG TABS: 500 | 90 days supply | Qty: 180 | Fill #1

## 2015-12-19 MED FILL — TRUE METRIX GLUCOSE TEST ST: 25 days supply | Qty: 100 | Fill #1

## 2015-12-19 MED FILL — SIMVASTATIN 10 MG TABLET: 10 | 60 days supply | Qty: 60 | Fill #1

## 2015-12-19 NOTE — Telephone Encounter (Signed)
Pt has [place]

## 2015-12-20 NOTE — Telephone Encounter (Signed)
Menactra is usually given as a one-time injection to adults (age 42-18) and children who are at least 57 years old. If you already had one as an adult you will not need a booster vaccine Pt verbalized understanding.

## 2015-12-21 ENCOUNTER — Ambulatory Visit: Payer: Self-pay | Admitting: Internal Medicine

## 2015-12-29 ENCOUNTER — Ambulatory Visit: Payer: Self-pay | Attending: Family Medicine | Admitting: Family Medicine

## 2015-12-29 ENCOUNTER — Encounter: Payer: Self-pay | Admitting: Family Medicine

## 2015-12-29 VITALS — BP 111/73 | HR 79 | Temp 98.3°F | Resp 16 | Wt 159.0 lb

## 2015-12-29 DIAGNOSIS — Z124 Encounter for screening for malignant neoplasm of cervix: Secondary | ICD-10-CM

## 2015-12-29 DIAGNOSIS — T8332XA Displacement of intrauterine contraceptive device, initial encounter: Secondary | ICD-10-CM

## 2015-12-29 DIAGNOSIS — Z30432 Encounter for removal of intrauterine contraceptive device: Secondary | ICD-10-CM | POA: Insufficient documentation

## 2015-12-29 DIAGNOSIS — T8389XA Other specified complication of genitourinary prosthetic devices, implants and grafts, initial encounter: Secondary | ICD-10-CM

## 2015-12-29 DIAGNOSIS — Z7984 Long term (current) use of oral hypoglycemic drugs: Secondary | ICD-10-CM | POA: Insufficient documentation

## 2015-12-29 DIAGNOSIS — R7303 Prediabetes: Secondary | ICD-10-CM

## 2015-12-29 DIAGNOSIS — Z30433 Encounter for removal and reinsertion of intrauterine contraceptive device: Secondary | ICD-10-CM | POA: Insufficient documentation

## 2015-12-29 DIAGNOSIS — K760 Fatty (change of) liver, not elsewhere classified: Secondary | ICD-10-CM

## 2015-12-29 DIAGNOSIS — Z79899 Other long term (current) drug therapy: Secondary | ICD-10-CM | POA: Insufficient documentation

## 2015-12-29 LAB — LIPID PANEL
CHOL/HDL RATIO: 3.9 ratio (ref <=5.0)
Cholesterol: 164 mg/dL (ref 125–200)
HDL: 42 mg/dL — AB (ref >=46)
LDL CALC: 100 mg/dL (ref <130)
TRIGLYCERIDES: 111 mg/dL (ref <150)
VLDL: 22 mg/dL (ref <30)

## 2015-12-29 LAB — COMPLETE METABOLIC PANEL WITH GFR
ALT: 18 U/L (ref 6–29)
AST: 18 U/L (ref 10–30)
Albumin: 4.7 g/dL (ref 3.6–5.1)
Alkaline Phosphatase: 49 U/L (ref 33–115)
BILIRUBIN TOTAL: 0.8 mg/dL (ref 0.2–1.2)
BUN: 12 mg/dL (ref 7–25)
CHLORIDE: 100 mmol/L (ref 98–110)
CO2: 27 mmol/L (ref 20–31)
Calcium: 9.6 mg/dL (ref 8.6–10.2)
Creat: 0.62 mg/dL (ref 0.50–1.10)
GLUCOSE: 92 mg/dL (ref 65–99)
Potassium: 4 mmol/L (ref 3.5–5.3)
SODIUM: 139 mmol/L (ref 135–146)
TOTAL PROTEIN: 7.7 g/dL (ref 6.1–8.1)

## 2015-12-29 MED ORDER — METFORMIN HCL 500 MG PO TABS: ORAL_TABLET | ORAL | Status: DC

## 2015-12-29 NOTE — Assessment & Plan Note (Signed)
Pap done today  

## 2015-12-29 NOTE — Assessment & Plan Note (Signed)
Unable to visualized IUD strings Patient given mirena to take with her to gyn appt, gyn referral placed Pelvic and TVUS ordered to confirm IUD in place

## 2015-12-29 NOTE — Patient Instructions (Addendum)
Bahar was seen today for employment physical.  Diagnoses and all orders for this visit:  IUD migration, initial encounter ([place]) -     US Pelvis Complete; Future -     US Transvaginal Non-OB; Future -     Ambulatory referral to Gynecology  Pap smear for cervical cancer screening -     Cytology - PAP  Prediabetes -     metFORMIN (GLUCOPHAGE) 500 MG tablet; Take 500 mg in the morning and 1000 mg at night -     COMPLETE METABOLIC PANEL WITH GFR -     Lipid Panel  Fatty liver disease, nonalcoholic -     metFORMIN (GLUCOPHAGE) 500 MG tablet; Take 500 mg in the morning and 1000 mg at night   I was unable to visualize your IUD strings Please take the IUD with you and take it to the gynecology visit You will be contacted with pap results and gyn referral   F/u in 3 months for pre-diabetes  Dr. Adrian Blackwater

## 2015-12-29 NOTE — Progress Notes (Signed)
Physical  No pain today  No suicidal thoughts in the past two weeks

## 2015-12-29 NOTE — Progress Notes (Signed)
Subjective:  Patient ID: Jennifer Jones, female    DOB: 1975-12-11  Age: 40 y.o. MRN: [id number]  CC: Employment Physical   HPI Jennifer Jones presents for   1. Pap: due for screening. No hx of abnormal paps. No vaginal discharge or abnormal bleeding.   2. IUD removal and replacement: this is her 3rd IUD. Has been in place since 2012. She desires removal and replace.   Social History  Substance Use Topics  . Smoking status: Never Smoker   . Smokeless tobacco: Never Used  . Alcohol Use: No    Outpatient Prescriptions Prior to Visit  Medication Sig Dispense Refill  . Blood Glucose Monitoring Suppl (TRUE METRIX METER) W/DEVICE KIT Used as instructed 1 kit 0  . cholecalciferol (VITAMIN D) 1000 UNITS tablet Take 1,000 Units by mouth daily.     . cyclobenzaprine (FLEXERIL) 10 MG tablet Take 1 tablet (10 mg total) by mouth 3 (three) times daily as needed for muscle spasms. 30 tablet 0  . glucose blood (TRUE METRIX BLOOD GLUCOSE TEST) test strip Use as instructed 100 each 12  . hydrocortisone (ANUSOL-HC) 2.5 % rectal cream Place 1 application rectally 2 (two) times daily. 30 g 0  . hydrocortisone-pramoxine (ANALPRAM HC) 2.5-1 % rectal cream Place 1 application rectally 3 (three) times daily. 30 g 0  . levonorgestrel (MIRENA) 20 MCG/24HR IUD 1 each by Intrauterine route once.    . loratadine (CLARITIN) 10 MG tablet Take 10 mg by mouth daily.    . naproxen (NAPROSYN) 500 MG tablet Take 1 tablet (500 mg total) by mouth 2 (two) times daily as needed (with food). 60 tablet 0  . simvastatin (ZOCOR) 10 MG tablet Take 1 tablet (10 mg total) by mouth at bedtime. 30 tablet 2  . TRUEPLUS LANCETS 28G MISC Use as directed 100 each 12  . metFORMIN (GLUCOPHAGE) 500 MG tablet Take 1 tablet (500 mg total) by mouth 2 (two) times daily with a meal. 180 tablet 3   No facility-administered medications prior to visit.    ROS Review of Systems  Constitutional: Negative for fever and chills.  Eyes:  Negative for visual disturbance.  Respiratory: Negative for shortness of breath.   Cardiovascular: Negative for chest pain.  Gastrointestinal: Negative for abdominal pain and blood in stool.  Genitourinary: Negative for vaginal bleeding, vaginal discharge and pelvic pain.  Musculoskeletal: Negative for back pain and arthralgias.  Skin: Negative for rash.  Allergic/Immunologic: Negative for immunocompromised state.  Hematological: Negative for adenopathy. Does not bruise/bleed easily.  Psychiatric/Behavioral: Negative for suicidal ideas and dysphoric mood.    Objective:  BP 111/73 mmHg  Pulse 79  Temp(Src) 98.3 F (36.8 C) (Oral)  Resp 16  Wt 159 lb (72.122 kg)  SpO2 100%  BP/Weight 12/29/2015 12/05/2015 40/06/7352  Systolic BP 299 242 683  Diastolic BP 73 73 78  Wt. (Lbs) 159 160 164  BMI 28.17 28.35 26.48   Physical Exam  Constitutional: She appears well-developed and well-nourished. No distress.  Pulmonary/Chest: Effort normal.  Genitourinary: Vagina normal and uterus normal. Pelvic exam was performed with patient prone. There is no rash, tenderness or lesion on the right labia. There is no rash, tenderness or lesion on the left labia. Cervix exhibits no motion tenderness, no discharge and no friability.  Unable to visualize IUD strings after multiple attempts to sweep strings down with the cytobrush.   Musculoskeletal: She exhibits no edema.  Lymphadenopathy:       Right: No inguinal adenopathy present.  Left: No inguinal adenopathy present.  Skin: Skin is warm and dry. No rash noted.     Assessment & Plan:   Jennifer Jones was seen today for employment physical.  Diagnoses and all orders for this visit:  IUD migration, initial encounter ([place]) -     US Pelvis Complete; Future -     US Transvaginal Non-OB; Future -     Ambulatory referral to Gynecology  Pap smear for cervical cancer screening -     Cytology - PAP  Prediabetes -     metFORMIN (GLUCOPHAGE) 500 MG  tablet; Take 500 mg in the morning and 1000 mg at night -     COMPLETE METABOLIC PANEL WITH GFR -     Lipid Panel  Fatty liver disease, nonalcoholic -     metFORMIN (GLUCOPHAGE) 500 MG tablet; Take 500 mg in the morning and 1000 mg at night    Meds ordered this encounter  Medications  . metFORMIN (GLUCOPHAGE) 500 MG tablet    Sig: Take 500 mg in the morning and 1000 mg at night    Dispense:  90 tablet    Refill:  3    Follow-up: No Follow-up on file.   Boykin Nearing MD

## 2015-12-30 LAB — CERVICOVAGINAL ANCILLARY ONLY
Chlamydia: NEGATIVE
Neisseria Gonorrhea: NEGATIVE
WET PREP (BD AFFIRM): NEGATIVE

## 2015-12-30 LAB — CYTOLOGY - PAP

## 2016-01-02 ENCOUNTER — Ambulatory Visit (HOSPITAL_COMMUNITY)
Admission: RE | Admit: 2016-01-02 | Discharge: 2016-01-02 | Disposition: A | Discharge status: Home / Self Care | Payer: Self-pay | Source: Ambulatory Visit | Attending: Family Medicine | Admitting: Family Medicine

## 2016-01-02 ENCOUNTER — Telehealth: Payer: Self-pay | Admitting: *Deleted

## 2016-01-02 DIAGNOSIS — T8332XA Displacement of intrauterine contraceptive device, initial encounter: Secondary | ICD-10-CM

## 2016-01-02 DIAGNOSIS — N911 Secondary amenorrhea: Secondary | ICD-10-CM | POA: Insufficient documentation

## 2016-01-02 DIAGNOSIS — T8389XA Other specified complication of genitourinary prosthetic devices, implants and grafts, initial encounter: Secondary | ICD-10-CM

## 2016-01-02 DIAGNOSIS — Z30431 Encounter for routine checking of intrauterine contraceptive device: Secondary | ICD-10-CM | POA: Insufficient documentation

## 2016-01-02 DIAGNOSIS — N854 Malposition of uterus: Secondary | ICD-10-CM | POA: Insufficient documentation

## 2016-01-02 NOTE — Telephone Encounter (Signed)
Date of birth verified by pt Normal lab results given  Pt verbalized understanding  

## 2016-01-02 NOTE — Telephone Encounter (Signed)
-----   Message from Boykin Nearing, MD sent at 12/30/2015  4:48 PM EDT ----- Negative screening GC/chlam

## 2016-01-02 NOTE — Telephone Encounter (Signed)
-----   Message from Boykin Nearing, MD sent at 12/30/2015 10:55 AM EDT ----- Negative screening GC/chlam/trich

## 2016-01-02 NOTE — Telephone Encounter (Signed)
-----   Message from Boykin Nearing, MD sent at 12/30/2015  8:36 AM EDT ----- Normal CMP Normal cholesterol

## 2016-01-03 ENCOUNTER — Telehealth: Payer: Self-pay | Admitting: *Deleted

## 2016-01-03 NOTE — Telephone Encounter (Signed)
-----   Message from Boykin Nearing, MD sent at 01/03/2016  8:44 AM EDT ----- IUD is in place in uterus Keep gyn appt for IUD removal and new IUD placement

## 2016-01-03 NOTE — Telephone Encounter (Signed)
Date of birth verified by pt  Pap smear results given IUD in place,  Referral for GYN send  GYN clinic will call with appointment  Pt verbalized understanding

## 2016-01-03 NOTE — Telephone Encounter (Signed)
-----   Message from Boykin Nearing, MD sent at 01/02/2016  9:08 AM EDT ----- Negative pap that is also HPV negative Repeat pap in 5 years

## 2016-01-04 ENCOUNTER — Encounter: Payer: Self-pay | Admitting: Internal Medicine

## 2016-01-19 ENCOUNTER — Encounter: Payer: Self-pay | Admitting: Family Medicine

## 2016-01-19 ENCOUNTER — Telehealth: Payer: Self-pay | Admitting: Family Medicine

## 2016-01-19 NOTE — Telephone Encounter (Signed)
Fax from [employer] that patient application for [facility] Patient Assistance Program for [place] IUD was incomplete. Patient asked to complete a new application to be submitted along with her income documents at her earliest convenience.  Please apologize for the inconvenience to patient on my behalf

## 2016-01-20 NOTE — Telephone Encounter (Signed)
Pt notified date of birth was missing  Date of birth added on form  Form refax to [employer]

## 2016-01-30 ENCOUNTER — Encounter: Payer: Self-pay | Admitting: Obstetrics and Gynecology

## 2016-02-16 ENCOUNTER — Telehealth: Payer: Self-pay

## 2016-02-16 ENCOUNTER — Encounter: Payer: Self-pay | Admitting: Physician Assistant

## 2016-02-16 ENCOUNTER — Ambulatory Visit: Payer: Self-pay | Attending: Physician Assistant | Admitting: Physician Assistant

## 2016-02-16 VITALS — BP 115/79 | HR 73 | Temp 97.8°F | Wt 161.6 lb

## 2016-02-16 DIAGNOSIS — Z79899 Other long term (current) drug therapy: Secondary | ICD-10-CM | POA: Insufficient documentation

## 2016-02-16 DIAGNOSIS — K760 Fatty (change of) liver, not elsewhere classified: Secondary | ICD-10-CM

## 2016-02-16 DIAGNOSIS — R21 Rash and other nonspecific skin eruption: Secondary | ICD-10-CM

## 2016-02-16 DIAGNOSIS — Z7984 Long term (current) use of oral hypoglycemic drugs: Secondary | ICD-10-CM | POA: Insufficient documentation

## 2016-02-16 DIAGNOSIS — Z888 Allergy status to other drugs, medicaments and biological substances status: Secondary | ICD-10-CM | POA: Insufficient documentation

## 2016-02-16 DIAGNOSIS — R7303 Prediabetes: Secondary | ICD-10-CM

## 2016-02-16 MED ORDER — TRIAMCINOLONE ACETONIDE 0.1 % EX CREA: 1.0000 "application " | TOPICAL_CREAM | Freq: Two times a day (BID) | CUTANEOUS | Status: DC

## 2016-02-16 MED ORDER — METFORMIN HCL 500 MG PO TABS: ORAL_TABLET | ORAL | Status: DC

## 2016-02-16 MED FILL — ?METFORMIN HCL 500MG TABLET: 500 | 30 days supply | Qty: 90 | Fill #0

## 2016-02-16 MED FILL — TRIAMCINOLONE 0.1% CREAM: 0.1 | 14 days supply | Qty: 30 | Fill #0

## 2016-02-16 NOTE — Telephone Encounter (Signed)
Pt is present today being seen by another provider for an issue. Pt advised CMA that she had been taking her Metformin 500 mg incorrectly. She has been taking 2 tablets in the am and 2 tablets at bedtime instead of 1 tablet in the am and 2 tablets at bedtime. Pt is requesting medication refill due to taking dosage incorrectly which has caused her to run out medication.

## 2016-02-16 NOTE — Progress Notes (Signed)
Patient ID: Jennifer Jones, female   DOB: 1976-10-04, 40 y.o.   MRN: [id number]   Mehgan Santmyer, is a 40 y.o. female  NMM:[id number]  RPR:[id number]  DOB - Feb 23, 1976  Chief Complaint  Patient presents with  . Rash    neck  x 2 mths        Subjective:  Chief Complaint and HPI: Jennifer Jones is a 40 y.o. female here today for pruritic rash on neck. Rash has been present for several weeks.  It started as a small spot on the L side of her neck and has now spread over the front of her throat/neck area.  She denies f/c.  No dysphagia.  No new soaps, lotions, creams, or recent new jewelry.  She tried lamisil cream but the rash has only worsened. She denies s/sx thyroid disease-constipation, diarrhea, weight loss, hair loss/changes, etc.    Needs RF on metformin.    ROS:   Constitutional:  No f/c, No night sweats, No unexplained weight loss. EENT:  No vision changes, No blurry vision, No hearing changes. No mouth, throat, or ear problems.  Respiratory: No cough, No SOB Cardiac: No CP, no palpitations GI:  No abd pain, No N/V/D. GU: No Urinary s/sx Musculoskeletal: No joint pain Neuro: No headache, no dizziness, no motor weakness.  Skin: +rash Endocrine:  No polydipsia. No polyuria.  Psych: Denies SI/HI    ALLERGIES: Allergies  Allergen Reactions  . Codeine   . Darvocet [Propoxyphene N-Acetaminophen] Itching  . Dilaudid [Hydromorphone Hcl] Itching  . Percocet [Oxycodone-Acetaminophen] Itching    PAST MEDICAL HISTORY: Past Medical History  Diagnosis Date  . Hx gestational diabetes 2002 and 2005  . H/O candidiasis   . H/O varicella   . Macrosomia   . GBS carrier   . Yeast infection   . Acute mastitis of left breast 04/14/2004  . H/O mastitis   . 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 Hx: UTI (urinary tract infection)   . Hx of candidal vulvovaginitis   . Fibrocystic breast 03/2007  . H/O: menorrhagia 2011  . Diabetes mellitus   . Genital atrophy of female      MEDICATIONS AT HOME: Prior to Admission medications   Medication Sig Start Date End Date Taking? Authorizing Provider  Blood Glucose Monitoring Suppl (TRUE METRIX METER) W/DEVICE KIT Used as instructed 06/24/15  Yes Josalyn Funches, MD  cholecalciferol (VITAMIN D) 1000 UNITS tablet Take 1,000 Units by mouth daily.    Yes Historical Provider, MD  cyclobenzaprine (FLEXERIL) 10 MG tablet Take 1 tablet (10 mg total) by mouth 3 (three) times daily as needed for muscle spasms. 08/23/15  Yes Josalyn Funches, MD  glucose blood (TRUE METRIX BLOOD GLUCOSE TEST) test strip Use as instructed 10/11/15  Yes Josalyn Funches, MD  hydrocortisone (ANUSOL-HC) 2.5 % rectal cream Place 1 application rectally 2 (two) times daily. 12/05/15  Yes Josalyn Funches, MD  hydrocortisone-pramoxine (ANALPRAM HC) 2.5-1 % rectal cream Place 1 application rectally 3 (three) times daily. 12/06/15  Yes Boykin Nearing, MD  levonorgestrel (MIRENA) 20 MCG/24HR IUD 1 each by Intrauterine route once. 07/09/11  Yes Historical Provider, MD  loratadine (CLARITIN) 10 MG tablet Take 10 mg by mouth daily.   Yes Historical Provider, MD  metFORMIN (GLUCOPHAGE) 500 MG tablet Take 500 mg in the morning and 1000 mg at night 02/16/16  Yes Dionne Bucy Aloha Bartok, PA-C  naproxen (NAPROSYN) 500 MG tablet Take 1 tablet (500 mg total) by mouth 2 (two) times daily as needed (with food).  11/16/14  Yes Josalyn Funches, MD  simvastatin (ZOCOR) 10 MG tablet Take 1 tablet (10 mg total) by mouth at bedtime. 11/08/15  Yes Boykin Nearing, MD  TRUEPLUS LANCETS 28G MISC Use as directed 10/11/15  Yes Josalyn Funches, MD  triamcinolone cream (KENALOG) 0.1 % Apply 1 application topically 2 (two) times daily. X 2 weeks then prn 02/16/16   Argentina Donovan, PA-C     Objective:  EXAM:   Filed Vitals:   02/16/16 1525  BP: 115/79  Pulse: 73  Temp: 97.8 F (36.6 C)  TempSrc: Oral  Weight: 161 lb 9.6 oz (73.301 kg)    General appearance : A&OX3. NAD.  Non-toxic-appearing HEENT: Atraumatic and Normocephalic.  PERRLA. EOM intact.  TM clear B. Mouth-MMM, post pharynx WNL w/o erythema, No PND. Neck: supple, no JVD. No cervical lymphadenopathy. No thyromegaly but there is fullness over the anterior aspect of the neck where the rash is present. Chest/Lungs:  Breathing-non-labored, Good air entry bilaterally, breath sounds normal without rales, rhonchi, or wheezing  CVS: S1 S2 regular, no murmurs, gallops, rubs  Abdomen: Bowel sounds present, Non tender and not distended with no gaurding, rigidity or rebound. Extremities: Bilateral Lower Ext shows no edema, both legs are warm to touch with = pulse throughout Neurology:  CN II-XII grossly intact, Non focal.   Psych:  TP linear. J/I WNL. Normal speech. Appropriate eye contact and affect.  Skin: Neck-slightly raised papular rash that is faintly erythematous and confluent over the L side and anterior aspect of the neck.  Appears like urticaria/hives.   Data Review Lab Results  Component Value Date   HGBA1C 5.60 12/05/2015   HGBA1C 6.3* 06/23/2015   HGBA1C 142.0 03/29/2015     Assessment & Plan   1. Rash of neck Stop topical lamisil ? Nickel allergy/Contact dermatitis vs eczematous exanthem - triamcinolone cream (KENALOG) 0.1 %; Apply 1 application topically 2 (two) times daily. X 2 weeks then prn  Dispense: 30 g; Refill: 0 Zyrtec may also be helpful Avoid jewelry, lotions, creams - Thyroid Panel With TSH - CBC with Differential/Platelet - Sedimentation Rate  2. Prediabetes Continue- - metFORMIN (GLUCOPHAGE) 500 MG tablet; Take 500 mg in the morning and 1000 mg at night  Dispense: 90 tablet; Refill: 3  Patient have been counseled extensively about nutrition and exercise  Return in about 6 weeks (around 03/29/2016).  The patient was given clear instructions to go to ER or return to medical center if symptoms don't improve, worsen or new problems develop. The patient verbalized  understanding. The patient was told to call to get lab results if they haven't heard anything in the next week.     Freeman Caldron, PA-C [facility] [place], [place]   02/16/2016, 4:55 PM

## 2016-02-16 NOTE — Telephone Encounter (Signed)
Metformin refilled by Freeman Caldron today

## 2016-02-16 NOTE — Patient Instructions (Signed)
Contact Dermatitis Dermatitis is redness, soreness, and swelling (inflammation) of the skin. Contact dermatitis is a reaction to certain substances that touch the skin. There are two types of contact dermatitis:   Irritant contact dermatitis. This type is caused by something that irritates your skin, such as dry hands from washing them too much. This type does not require previous exposure to the substance for a reaction to occur. This type is more common.  Allergic contact dermatitis. This type is caused by a substance that you are allergic to, such as a nickel allergy or poison ivy. This type only occurs if you have been exposed to the substance (allergen) before. Upon a repeat exposure, your body reacts to the substance. This type is less common. CAUSES  Many different substances can cause contact dermatitis. Irritant contact dermatitis is most commonly caused by exposure to:   Makeup.   Soaps.   Detergents.   Bleaches.   Acids.   Metal salts, such as nickel.  Allergic contact dermatitis is most commonly caused by exposure to:   Poisonous plants.   Chemicals.   Jewelry.   Latex.   Medicines.   Preservatives in products, such as clothing.  RISK FACTORS This condition is more likely to develop in:   People who have jobs that expose them to irritants or allergens.  People who have certain medical conditions, such as asthma or eczema.  SYMPTOMS  Symptoms of this condition may occur anywhere on your body where the irritant has touched you or is touched by you. Symptoms include:  Dryness or flaking.   Redness.   Cracks.   Itching.   Pain or a burning feeling.   Blisters.  Drainage of small amounts of blood or clear fluid from skin cracks. With allergic contact dermatitis, there may also be swelling in areas such as the eyelids, mouth, or genitals.  DIAGNOSIS  This condition is diagnosed with a medical history and physical exam. A patch skin test  may be performed to help determine the cause. If the condition is related to your job, you may need to see an occupational medicine specialist. TREATMENT Treatment for this condition includes figuring out what caused the reaction and protecting your skin from further contact. Treatment may also include:   Steroid creams or ointments. Oral steroid medicines may be needed in more severe cases.  Antibiotics or antibacterial ointments, if a skin infection is present.  Antihistamine lotion or an antihistamine taken by mouth to ease itching.  A bandage (dressing). HOME CARE INSTRUCTIONS Skin Care  Moisturize your skin as needed.   Apply cool compresses to the affected areas.  Try taking a bath with:  Epsom salts. Follow the instructions on the packaging. You can get these at your local pharmacy or grocery store.  Baking soda. Pour a small amount into the bath as directed by your health care provider.  Colloidal oatmeal. Follow the instructions on the packaging. You can get this at your local pharmacy or grocery store.  Try applying baking soda paste to your skin. Stir water into baking soda until it reaches a paste-like consistency.  Do not scratch your skin.  Bathe less frequently, such as every other day.  Bathe in lukewarm water. Avoid using hot water. Medicines  Take or apply over-the-counter and prescription medicines only as told by your health care provider.   If you were prescribed an antibiotic medicine, take or apply your antibiotic as told by your health care provider. Do not stop using the   antibiotic even if your condition starts to improve. General Instructions  Keep all follow-up visits as told by your health care provider. This is important.  Avoid the substance that caused your reaction. If you do not know what caused it, keep a journal to try to track what caused it. Write down:  What you eat.  What cosmetic products you use.  What you drink.  What  you wear in the affected area. This includes jewelry.  If you were given a dressing, take care of it as told by your health care provider. This includes when to change and remove it. SEEK MEDICAL CARE IF:   Your condition does not improve with treatment.  Your condition gets worse.  You have signs of infection such as swelling, tenderness, redness, soreness, or warmth in the affected area.  You have a fever.  You have new symptoms. SEEK IMMEDIATE MEDICAL CARE IF:   You have a severe headache, neck pain, or neck stiffness.  You vomit.  You feel very sleepy.  You notice red streaks coming from the affected area.  Your bone or joint underneath the affected area becomes painful after the skin has healed.  The affected area turns darker.  You have difficulty breathing.   This information is not intended to replace advice given to you by your health care provider. Make sure you discuss any questions you have with your health care provider.   Document Released: 09/28/2000 Document Revised: 06/22/2015 Document Reviewed: 02/16/2015 Elsevier Interactive Patient Education 2016 Elsevier Inc.  

## 2016-02-17 LAB — THYROID PANEL WITH TSH
Free Thyroxine Index: 2.5 (ref 1.4–3.8)
T3 UPTAKE: 28 % (ref 22–35)
T4 TOTAL: 8.9 ug/dL (ref 4.5–12.0)
TSH: 0.81 m[IU]/L

## 2016-02-17 LAB — CBC WITH DIFFERENTIAL/PLATELET
Basophils Absolute: 0 cells/uL (ref 0–200)
Basophils Relative: 0 %
EOS PCT: 4 %
Eosinophils Absolute: 356 cells/uL (ref 15–500)
HEMATOCRIT: 42.2 % (ref 35.0–45.0)
Hemoglobin: 14 g/dL (ref 11.7–15.5)
LYMPHS PCT: 33 %
Lymphs Abs: 2937 cells/uL (ref 850–3900)
MCH: 29.7 pg (ref 27.0–33.0)
MCHC: 33.2 g/dL (ref 32.0–36.0)
MCV: 89.6 fL (ref 80.0–100.0)
MONO ABS: 534 {cells}/uL (ref 200–950)
MPV: 10.2 fL (ref 7.5–12.5)
Monocytes Relative: 6 %
NEUTROS PCT: 57 %
Neutro Abs: 5073 cells/uL (ref 1500–7800)
Platelets: 330 10*3/uL (ref 140–400)
RBC: 4.71 MIL/uL (ref 3.80–5.10)
RDW: 12.9 % (ref 11.0–15.0)
WBC: 8.9 10*3/uL (ref 3.8–10.8)

## 2016-02-17 LAB — SEDIMENTATION RATE: Sed Rate: 12 mm/hr (ref 0–20)

## 2016-03-15 ENCOUNTER — Other Ambulatory Visit: Payer: Self-pay | Admitting: Family Medicine

## 2016-03-15 ENCOUNTER — Ambulatory Visit: Payer: Self-pay | Attending: Family Medicine

## 2016-03-15 MED FILL — metFORMIN HCL 500 MG TABS: 500 | 30 days supply | Qty: 90 | Fill #1

## 2016-03-15 MED FILL — TRUE METRIX TEST STRIP: 30 days supply | Qty: 100 | Fill #3

## 2016-03-19 ENCOUNTER — Encounter: Payer: Self-pay | Admitting: General Practice

## 2016-03-19 ENCOUNTER — Ambulatory Visit (INDEPENDENT_AMBULATORY_CARE_PROVIDER_SITE_OTHER): Payer: Self-pay | Admitting: Obstetrics & Gynecology

## 2016-03-19 ENCOUNTER — Encounter: Payer: Self-pay | Admitting: Obstetrics & Gynecology

## 2016-03-19 VITALS — BP 111/66 | HR 67 | Temp 98.3°F | Ht 63.0 in | Wt 158.3 lb

## 2016-03-19 DIAGNOSIS — Z30433 Encounter for removal and reinsertion of intrauterine contraceptive device: Secondary | ICD-10-CM

## 2016-03-19 DIAGNOSIS — Z30430 Encounter for insertion of intrauterine contraceptive device: Secondary | ICD-10-CM

## 2016-03-19 DIAGNOSIS — R102 Pelvic and perineal pain: Secondary | ICD-10-CM

## 2016-03-19 DIAGNOSIS — G8918 Other acute postprocedural pain: Secondary | ICD-10-CM

## 2016-03-19 MED ORDER — KETOROLAC TROMETHAMINE 15 MG/ML IJ SOLN
15.0000 mg | Freq: Once | INTRAMUSCULAR | Status: AC
  Administered 2016-03-19: 15 mg via INTRAMUSCULAR

## 2016-03-19 MED ORDER — LEVONORGESTREL 20 MCG/24HR IU IUD
INTRAUTERINE_SYSTEM | Freq: Once | INTRAUTERINE | Status: AC
  Administered 2016-03-19: 1 via INTRAUTERINE

## 2016-03-19 NOTE — Patient Instructions (Signed)
Intrauterine Device Information An intrauterine device (IUD) is inserted into your uterus to prevent pregnancy. There are two types of IUDs available:   Copper IUD--This type of IUD is wrapped in copper wire and is placed inside the uterus. Copper makes the uterus and fallopian tubes produce a fluid that kills sperm. The copper IUD can stay in place for 10 years.  Hormone IUD--This type of IUD contains the hormone progestin (synthetic progesterone). The hormone thickens the cervical mucus and prevents sperm from entering the uterus. It also thins the uterine lining to prevent implantation of a fertilized egg. The hormone can weaken or kill the sperm that get into the uterus. One type of hormone IUD can stay in place for 5 years, and another type can stay in place for 3 years. Your health care provider will make sure you are a good candidate for a contraceptive IUD. Discuss with your health care provider the possible side effects.  ADVANTAGES OF AN INTRAUTERINE DEVICE  IUDs are highly effective, reversible, long acting, and low maintenance.   There are no estrogen-related side effects.   An IUD can be used when breastfeeding.   IUDs are not associated with weight gain.   The copper IUD works immediately after insertion.   The hormone IUD works right away if inserted within 7 days of your period starting. You will need to use a backup method of birth control for 7 days if the hormone IUD is inserted at any other time in your cycle.  The copper IUD does not interfere with your female hormones.   The hormone IUD can make heavy menstrual periods lighter and decrease cramping.   The hormone IUD can be used for 3 or 5 years.   The copper IUD can be used for 10 years. DISADVANTAGES OF AN INTRAUTERINE DEVICE  The hormone IUD can be associated with irregular bleeding patterns.   The copper IUD can make your menstrual flow heavier and more painful.   You may experience cramping and  vaginal bleeding after insertion.    This information is not intended to replace advice given to you by your health care provider. Make sure you discuss any questions you have with your health care provider.   Document Released: 09/04/2004 Document Revised: 06/03/2013 Document Reviewed: 03/22/2013 Elsevier Interactive Patient Education 2016 Elsevier Inc.  

## 2016-03-19 NOTE — Progress Notes (Signed)
Patient ID: Jennifer Jones, female   DOB: 07-01-1976, 40 y.o.   MRN: [id number] Jennifer Jones is a 40 y.o. G3P3003 here for Mirena IUD removal and reinsertion. No GYN concerns.  Pt was seen at the [facility] but, they could not remove the IUD.  She received an IUD form a grant and brought that with her for insertion.    IUD Removal and Reinsertion  Patient identified, informed consent performed. Discussed risks of irregular bleeding, cramping, infection, malpositioning or misplacement of the IUD outside the uterus which may require further procedures. Time out was performed. Speculum placed in the vagina. Cervix visualized. Cleaned with Betadine x 2. Grasped anteriorly with a single tooth tenaculum. The strings of the IUD were not visualized but, a Kelly clamp was used to identify and removed the IUD without complication.  The IUD was successfully removed in its entirety. Uterus sounded to 8 cm. Mirena IUD placed per manufacturer's recommendations. Strings trimmed to 3 cm. Tenaculum was removed, good hemostasis noted. Patient tolerated procedure well. Patient was given post-procedure instructions.  Patient was also asked to check IUD strings periodically and follow up in 4 weeks for IUD check at the [facility].  Pt is fasting fro Ramadan and cannot take NSAIDS by mouth.  Pt was given Toradol 15mg  IM prior to leaving ofc.  Rami Waddle L. Harraway-Smith, M.D., [place] June

## 2016-03-26 ENCOUNTER — Ambulatory Visit: Payer: Self-pay | Admitting: Family Medicine

## 2016-04-06 ENCOUNTER — Ambulatory Visit: Payer: Self-pay | Admitting: Family Medicine

## 2016-04-10 ENCOUNTER — Telehealth: Payer: Self-pay

## 2016-04-10 NOTE — Telephone Encounter (Signed)
Pt had IUD placed on 03/26/2016 she has been experiencing a lot of pain around her naval area, cramps and discomfort. She would like to know if this is normal?

## 2016-04-11 MED FILL — metFORMIN HCL 500 MG TABS: 500 | 90 days supply | Qty: 270 | Fill #0

## 2016-04-11 MED FILL — TRUE METRIX GLUCOSE TEST ST: 25 days supply | Qty: 100 | Fill #2

## 2016-04-11 MED FILL — SIMVASTATIN 10 MG TABLET: 10 | 90 days supply | Qty: 90 | Fill #0

## 2016-04-19 ENCOUNTER — Ambulatory Visit: Payer: Self-pay | Attending: Family Medicine | Admitting: Family Medicine

## 2016-04-19 ENCOUNTER — Encounter: Payer: Self-pay | Admitting: Family Medicine

## 2016-04-19 VITALS — BP 121/79 | HR 70 | Temp 97.9°F | Resp 12 | Ht 63.0 in | Wt 156.0 lb

## 2016-04-19 DIAGNOSIS — M545 Low back pain, unspecified: Secondary | ICD-10-CM | POA: Insufficient documentation

## 2016-04-19 DIAGNOSIS — Z23 Encounter for immunization: Secondary | ICD-10-CM

## 2016-04-19 DIAGNOSIS — G8929 Other chronic pain: Secondary | ICD-10-CM | POA: Insufficient documentation

## 2016-04-19 DIAGNOSIS — Z7984 Long term (current) use of oral hypoglycemic drugs: Secondary | ICD-10-CM | POA: Insufficient documentation

## 2016-04-19 DIAGNOSIS — E119 Type 2 diabetes mellitus without complications: Secondary | ICD-10-CM | POA: Insufficient documentation

## 2016-04-19 DIAGNOSIS — E781 Pure hyperglyceridemia: Secondary | ICD-10-CM | POA: Insufficient documentation

## 2016-04-19 DIAGNOSIS — R7303 Prediabetes: Secondary | ICD-10-CM

## 2016-04-19 DIAGNOSIS — Z975 Presence of (intrauterine) contraceptive device: Secondary | ICD-10-CM

## 2016-04-19 DIAGNOSIS — Z79899 Other long term (current) drug therapy: Secondary | ICD-10-CM | POA: Insufficient documentation

## 2016-04-19 LAB — GLUCOSE, POCT (MANUAL RESULT ENTRY): POC GLUCOSE: 73 mg/dL (ref 70–99)

## 2016-04-19 LAB — POCT GLYCOSYLATED HEMOGLOBIN (HGB A1C): Hemoglobin A1C: 5.7

## 2016-04-19 MED ORDER — TETANUS-DIPHTH-ACELL PERTUSSIS 5-2.5-18.5 LF-MCG/0.5 IM SUSP
0.5000 mL | Freq: Once | INTRAMUSCULAR | Status: DC
Start: 2016-04-19 — End: 2016-04-19

## 2016-04-19 MED ORDER — METFORMIN HCL ER 750 MG PO TB24: 1500.0000 mg | ORAL_TABLET | Freq: Every day | ORAL | Status: DC

## 2016-04-19 MED ORDER — DICLOFENAC SODIUM 75 MG PO TBEC: 75.0000 mg | DELAYED_RELEASE_TABLET | Freq: Two times a day (BID) | ORAL | Status: DC

## 2016-04-19 MED FILL — METFORMIN HCL ER 750 MG TAB: 750 | 30 days supply | Qty: 60 | Fill #0

## 2016-04-19 MED FILL — DICLOFENAC SOD DR 75 MG TAB: 75 | 30 days supply | Qty: 60 | Fill #0

## 2016-04-19 NOTE — Patient Instructions (Addendum)
Jennifer Jones was seen today for follow-up.  Diagnoses and all orders for this visit:  Prediabetes -     Cancel: POCT CBG monitoring; Standing -     Cancel: POCT CBG monitoring -     POCT glucose (manual entry) -     POCT glycosylated hemoglobin (Hb A1C) -     Microalbumin/Creatinine Ratio, Urine -     metFORMIN (GLUCOPHAGE XR) 750 MG 24 hr tablet; Take 2 tablets (1,500 mg total) by mouth daily after supper.  Left-sided low back pain without sciatica -     diclofenac (VOLTAREN) 75 MG EC tablet; Take 1 tablet (75 mg total) by mouth 2 (two) times daily.  Hypertriglyceridemia -     Lipid Panel    F/u in 6 months for pre-diabetes, f/u sooner as needed for back pain   Dr. Adrian Blackwater   Tdap Vaccine (Tetanus, Diphtheria and Pertussis): What You Need to Know   1. Why get vaccinated? Tetanus, diphtheria and pertussis are very serious diseases. Tdap vaccine can protect [place] from these diseases. And, Tdap vaccine given to pregnant women can protect newborn babies against pertussis. TETANUS (Lockjaw) is rare in the [place] today. It causes painful muscle tightening and stiffness, usually all over the body.  It can lead to tightening of muscles in the head and neck so you can't open your mouth, swallow, or sometimes even breathe. Tetanus kills about 1 out of 10 people who are infected even after receiving the best medical care. DIPHTHERIA is also rare in the [place] today. It can cause a thick coating to form in the back of the throat.  It can lead to breathing problems, heart failure, paralysis, and death. PERTUSSIS (Whooping Cough) causes severe coughing spells, which can cause difficulty breathing, vomiting and disturbed sleep.  It can also lead to weight loss, incontinence, and rib fractures. Up to 2 in 100 adolescents and 5 in 100 adults with pertussis are hospitalized or have complications, which could include pneumonia or death. These diseases are caused by bacteria. Diphtheria and  pertussis are spread from person to person through secretions from coughing or sneezing. Tetanus enters the body through cuts, scratches, or wounds. Before vaccines, as many as 200,000 cases of diphtheria, 200,000 cases of pertussis, and hundreds of cases of tetanus, were reported in the [place] each year. Since vaccination began, reports of cases for tetanus and diphtheria have dropped by about 99% and for pertussis by about 80%. 2. Tdap vaccine Tdap vaccine can protect adolescents and adults from tetanus, diphtheria, and pertussis. One dose of Tdap is routinely given at age 50 or 61. People who did not get Tdap at that age should get it as soon as possible. Tdap is especially important for healthcare professionals and anyone having close contact with a baby younger than 12 months. Pregnant women should get a dose of Tdap during every pregnancy, to protect the newborn from pertussis. Infants are most at risk for severe, life-threatening complications from pertussis. Another vaccine, called Td, protects against tetanus and diphtheria, but not pertussis. A Td booster should be given every 10 years. Tdap may be given as one of these boosters if you have never gotten Tdap before. Tdap may also be given after a severe cut or burn to prevent tetanus infection. Your doctor or the person giving you the vaccine can give you more information. Tdap may safely be given at the same time as other vaccines. 3. Some people should not get this vaccine  A  person who has ever had a life-threatening allergic reaction after a previous dose of any diphtheria, tetanus or pertussis containing vaccine, OR has a severe allergy to any part of this vaccine, should not get Tdap vaccine. Tell the person giving the vaccine about any severe allergies.  Anyone who had coma or long repeated seizures within 7 days after a childhood dose of DTP or DTaP, or a previous dose of Tdap, should not get Tdap, unless a cause other than the  vaccine was found. They can still get Td.  Talk to your doctor if you:  have seizures or another nervous system problem,  had severe pain or swelling after any vaccine containing diphtheria, tetanus or pertussis,  ever had a condition called Guillain-Barr Syndrome (GBS),  aren't feeling well on the day the shot is scheduled. 4. Risks With any medicine, including vaccines, there is a chance of side effects. These are usually mild and go away on their own. Serious reactions are also possible but are rare. Most people who get Tdap vaccine do not have any problems with it. Mild problems following Tdap (Did not interfere with activities)  Pain where the shot was given (about 3 in 4 adolescents or 2 in 3 adults)  Redness or swelling where the shot was given (about 1 person in 5)  Mild fever of at least 100.7F (up to about 1 in 25 adolescents or 1 in 100 adults)  Headache (about 3 or 4 people in 10)  Tiredness (about 1 person in 3 or 4)  Nausea, vomiting, diarrhea, stomach ache (up to 1 in 4 adolescents or 1 in 10 adults)  Chills, sore joints (about 1 person in 10)  Body aches (about 1 person in 3 or 4)  Rash, swollen glands (uncommon) Moderate problems following Tdap (Interfered with activities, but did not require medical attention)  Pain where the shot was given (up to 1 in 5 or 6)  Redness or swelling where the shot was given (up to about 1 in 16 adolescents or 1 in 12 adults)  Fever over 102F (about 1 in 100 adolescents or 1 in 250 adults)  Headache (about 1 in 7 adolescents or 1 in 10 adults)  Nausea, vomiting, diarrhea, stomach ache (up to 1 or 3 people in 100)  Swelling of the entire arm where the shot was given (up to about 1 in 500). Severe problems following Tdap (Unable to perform usual activities; required medical attention)  Swelling, severe pain, bleeding and redness in the arm where the shot was given (rare). Problems that could happen after any  vaccine:  People sometimes faint after a medical procedure, including vaccination. Sitting or lying down for about 15 minutes can help prevent fainting, and injuries caused by a fall. Tell your doctor if you feel dizzy, or have vision changes or ringing in the ears.  Some people get severe pain in the shoulder and have difficulty moving the arm where a shot was given. This happens very rarely.  Any medication can cause a severe allergic reaction. Such reactions from a vaccine are very rare, estimated at fewer than 1 in a million doses, and would happen within a few minutes to a few hours after the vaccination. As with any medicine, there is a very remote chance of a vaccine causing a serious injury or death. The safety of vaccines is always being monitored. For more information, visit: http://www.aguilar.org/ 5. What if there is a serious problem? What should I look for?  Look  for anything that concerns you, such as signs of a severe allergic reaction, very high fever, or unusual behavior.  Signs of a severe allergic reaction can include hives, swelling of the face and throat, difficulty breathing, a fast heartbeat, dizziness, and weakness. These would usually start a few minutes to a few hours after the vaccination. What should I do?  If you think it is a severe allergic reaction or other emergency that can't wait, call 9-1-1 or get the person to the nearest hospital. Otherwise, call your doctor.  Afterward, the reaction should be reported to the Vaccine Adverse Event Reporting System (VAERS). Your doctor might file this report, or you can do it yourself through the VAERS web site at www.vaers.SamedayNews.es, or by calling 9141820899. VAERS does not give medical advice.  6. The National Vaccine Injury Compensation Program The [employer] Vaccine Injury Compensation Program (VICP) is a federal program that was created to compensate people who may have been injured by certain vaccines. Persons who  believe they may have been injured by a vaccine can learn about the program and about filing a claim by calling 651-572-3328 or visiting the [place] website at GoldCloset.com.ee. There is a time limit to file a claim for compensation. 7. How can I learn more?  Ask your doctor. He or she can give you the vaccine package insert or suggest other sources of information.  Call your local or state health department.  Contact the Centers for Disease Control and Prevention (CDC):  Call 779-535-6816 (1-800-CDC-INFO) or  Visit CDC's website at http://hunter.com/ CDC Tdap Vaccine VIS (12/08/13)   This information is not intended to replace advice given to you by your health care provider. Make sure you discuss any questions you have with your health care provider.   Document Released: 04/01/2012 Document Revised: 10/22/2014 Document Reviewed: 01/13/2014 Elsevier Interactive Patient Education [employer].

## 2016-04-19 NOTE — Progress Notes (Signed)
Pt here for F/U for DM. Pt CBG is 73 and A1c is 5.7. Pt reports pain in lower back described as aching and burning. Pain comes and goes. Pt reports a bulging disc which causes the pain. Pain has been worse this week, but takes naproxen to help. Pt needs refill on naproxen. Pt has not taken her medications this morning.

## 2016-04-19 NOTE — Progress Notes (Signed)
Subjective:  Patient ID: Jennifer Jones, female    DOB: 1976-06-04  Age: 40 y.o. MRN: [id number]  CC: Follow-up   HPI Carling Lefferts has hx of prediabetes, chronic low back pain  presents for    1. Pre-diabetes: taking metformin. No plolyuria, polydipsia or numbness in extremities.   2. Chronic back pain: lower back pain. Naproxen helps most of the time but has not helped much this past week. She is not exercising like she previously was. No incontinence, dysuria, fever or numbness in groin or weakness in legs.   Lumbar MRI 10/05/2015:  IMPRESSION: 1. Symptomatic level favored to be L3-L4 where a broad-based left foraminal disc herniation results in up to moderate left foraminal stenosis. Correlate for associated left L3 radiculitis. 2. Chronic disc and endplate degeneration at L5-S1 in the setting of mild chronic retrolisthesis. At the disc space level both lateral recesses are mildly affected, but there is a superimposed right side caudal disc extrusion or small sequestered fragment suspected displacing the descending right S1 nerve roots. See series 500, image 31.  Social History  Substance Use Topics  . Smoking status: Never Smoker   . Smokeless tobacco: Never Used  . Alcohol Use: No    Outpatient Prescriptions Prior to Visit  Medication Sig Dispense Refill  . Blood Glucose Monitoring Suppl (TRUE METRIX METER) W/DEVICE KIT Used as instructed 1 kit 0  . cholecalciferol (VITAMIN D) 1000 UNITS tablet Take 1,000 Units by mouth daily.     Marland Kitchen glucose blood (TRUE METRIX BLOOD GLUCOSE TEST) test strip Use as instructed 100 each 12  . levonorgestrel (MIRENA) 20 MCG/24HR IUD 1 each by Intrauterine route once.    . loratadine (CLARITIN) 10 MG tablet Take 10 mg by mouth daily.    . metFORMIN (GLUCOPHAGE) 500 MG tablet Take 500 mg in the morning and 1000 mg at night 90 tablet 3  . naproxen (NAPROSYN) 500 MG tablet Take 1 tablet (500 mg total) by mouth 2 (two) times daily as needed  (with food). 60 tablet 0  . simvastatin (ZOCOR) 10 MG tablet TAKE 1 TABLET BY MOUTH AT BEDTIME. 30 tablet 2  . TRUEPLUS LANCETS 28G MISC Use as directed 100 each 12  . cyclobenzaprine (FLEXERIL) 10 MG tablet Take 1 tablet (10 mg total) by mouth 3 (three) times daily as needed for muscle spasms. (Patient not taking: Reported on 04/19/2016) 30 tablet 0   No facility-administered medications prior to visit.    ROS Review of Systems  Constitutional: Negative for fever and chills.  Eyes: Negative for visual disturbance.  Respiratory: Negative for shortness of breath.   Cardiovascular: Negative for chest pain.  Gastrointestinal: Negative for abdominal pain and blood in stool.  Musculoskeletal: Positive for back pain. Negative for arthralgias.  Skin: Negative for rash.  Allergic/Immunologic: Negative for immunocompromised state.  Hematological: Negative for adenopathy. Does not bruise/bleed easily.  Psychiatric/Behavioral: Negative for suicidal ideas and dysphoric mood.    Objective:  BP 121/79 mmHg  Pulse 70  Temp(Src) 97.9 F (36.6 C) (Oral)  Resp 12  Ht _0  (1.6 m)  Wt 156 lb (70.761 kg)  BMI 27.64 kg/m2  SpO2 99%  BP/Weight 04/19/2016 11/23/9369 03/23/6788  Systolic BP 381 017 510  Diastolic BP 79 66 79  Wt. (Lbs) 156 158.3 161.6  BMI 27.64 28.05 28.63    Physical Exam  Constitutional: She is oriented to person, place, and time. She appears well-developed and well-nourished. No distress.  HENT:  Head: Normocephalic and atraumatic.  Cardiovascular: Normal rate, regular rhythm, normal heart sounds and intact distal pulses.   Pulmonary/Chest: Effort normal and breath sounds normal.  Musculoskeletal: She exhibits no edema.  Back Exam: Back: Normal Curvature, no deformities or CVA tenderness  Paraspinal Tenderness: b/l lumbar  LE Strength 5/5  LE Sensation: in tact  LE Reflexes 2+ and symmetric  Straight leg raise: negative     Neurological: She is alert and oriented to  person, place, and time.  Skin: Skin is warm and dry. No rash noted.  Psychiatric: She has a normal mood and affect.    Lab Results  Component Value Date   HGBA1C 5.7 04/19/2016   CBG 73    Assessment & Plan:  Aneira was seen today for follow-up.  Diagnoses and all orders for this visit:  Prediabetes -     Cancel: POCT CBG monitoring; Standing -     Cancel: POCT CBG monitoring -     POCT glucose (manual entry) -     POCT glycosylated hemoglobin (Hb A1C) -     Microalbumin/Creatinine Ratio, Urine -     metFORMIN (GLUCOPHAGE XR) 750 MG 24 hr tablet; Take 2 tablets (1,500 mg total) by mouth daily after supper.  Left-sided low back pain without sciatica -     diclofenac (VOLTAREN) 75 MG EC tablet; Take 1 tablet (75 mg total) by mouth 2 (two) times daily.  Hypertriglyceridemia -     Cancel: Lipid Panel  Need for prophylactic vaccination with combined diphtheria-tetanus-pertussis (DTP) vaccine -     Discontinue: Tdap (BOOSTRIX) injection 0.5 mL; Inject 0.5 mLs into the muscle once. -     Cancel: Tdap vaccine greater than or equal to 7yo IM -     Tdap vaccine greater than or equal to 7yo IM    No orders of the defined types were placed in this encounter.    Follow-up: Return in about 6 months (around 10/20/2016) for pre-diabetes and chronic low back pain .   Boykin Nearing MD

## 2016-04-20 LAB — MICROALBUMIN / CREATININE URINE RATIO
Creatinine, Urine: 50 mg/dL (ref 20–320)
MICROALB/CREAT RATIO: 8 ug/mg{creat} (ref <30)
Microalb, Ur: 0.4 mg/dL

## 2016-04-22 DIAGNOSIS — Z975 Presence of (intrauterine) contraceptive device: Secondary | ICD-10-CM | POA: Insufficient documentation

## 2016-04-22 NOTE — Assessment & Plan Note (Signed)
Chronic pain. No red flags Plan: Encouraged patient to exercise  Change from naproxen to diclofenac for pain

## 2016-04-24 NOTE — Telephone Encounter (Signed)
Pt given pain meds at her pcp office recently. Unable to reach by phone.

## 2016-05-09 ENCOUNTER — Inpatient Hospital Stay (HOSPITAL_COMMUNITY): Payer: Self-pay

## 2016-05-09 ENCOUNTER — Encounter (HOSPITAL_COMMUNITY): Payer: Self-pay | Admitting: *Deleted

## 2016-05-09 ENCOUNTER — Inpatient Hospital Stay (HOSPITAL_COMMUNITY)
Admission: AD | Admit: 2016-05-09 | Discharge: 2016-05-09 | Disposition: A | Discharge status: Home / Self Care | Payer: Self-pay | Source: Ambulatory Visit | Attending: Obstetrics and Gynecology | Admitting: Obstetrics and Gynecology

## 2016-05-09 DIAGNOSIS — R102 Pelvic and perineal pain: Secondary | ICD-10-CM

## 2016-05-09 DIAGNOSIS — K521 Toxic gastroenteritis and colitis: Secondary | ICD-10-CM | POA: Insufficient documentation

## 2016-05-09 DIAGNOSIS — R1011 Right upper quadrant pain: Secondary | ICD-10-CM | POA: Insufficient documentation

## 2016-05-09 DIAGNOSIS — E119 Type 2 diabetes mellitus without complications: Secondary | ICD-10-CM | POA: Insufficient documentation

## 2016-05-09 DIAGNOSIS — Z975 Presence of (intrauterine) contraceptive device: Secondary | ICD-10-CM

## 2016-05-09 DIAGNOSIS — E559 Vitamin D deficiency, unspecified: Secondary | ICD-10-CM | POA: Insufficient documentation

## 2016-05-09 DIAGNOSIS — E785 Hyperlipidemia, unspecified: Secondary | ICD-10-CM | POA: Insufficient documentation

## 2016-05-09 DIAGNOSIS — T383X5A Adverse effect of insulin and oral hypoglycemic [antidiabetic] drugs, initial encounter: Secondary | ICD-10-CM | POA: Insufficient documentation

## 2016-05-09 HISTORY — DX: Hyperlipidemia, unspecified: E78.5

## 2016-05-09 HISTORY — DX: Other intervertebral disc degeneration, lumbar region without mention of lumbar back pain or lower extremity pain: M51.369

## 2016-05-09 HISTORY — DX: Vitamin D deficiency, unspecified: E55.9

## 2016-05-09 HISTORY — DX: Fatty (change of) liver, not elsewhere classified: K76.0

## 2016-05-09 HISTORY — DX: Other intervertebral disc degeneration, lumbar region: M51.36

## 2016-05-09 HISTORY — DX: Sciatica, unspecified side: M54.30

## 2016-05-09 HISTORY — DX: Other intervertebral disc displacement, lumbar region: M51.26

## 2016-05-09 LAB — URINALYSIS, ROUTINE W REFLEX MICROSCOPIC
BILIRUBIN URINE: NEGATIVE
Glucose, UA: NEGATIVE mg/dL
HGB URINE DIPSTICK: NEGATIVE
Ketones, ur: 15 mg/dL — AB
Leukocytes, UA: NEGATIVE
Nitrite: NEGATIVE
PH: 5.5 (ref 5.0–8.0)
Protein, ur: NEGATIVE mg/dL
SPECIFIC GRAVITY, URINE: 1.02 (ref 1.005–1.030)

## 2016-05-09 LAB — CBC
HCT: 39.4 % (ref 36.0–46.0)
HEMOGLOBIN: 13.2 g/dL (ref 12.0–15.0)
MCH: 29.1 pg (ref 26.0–34.0)
MCHC: 33.5 g/dL (ref 30.0–36.0)
MCV: 87 fL (ref 78.0–100.0)
PLATELETS: 243 10*3/uL (ref 150–400)
RBC: 4.53 MIL/uL (ref 3.87–5.11)
RDW: 13.4 % (ref 11.5–15.5)
WBC: 10.3 10*3/uL (ref 4.0–10.5)

## 2016-05-09 LAB — WET PREP, GENITAL
CLUE CELLS WET PREP: NONE SEEN
SPERM: NONE SEEN
Trich, Wet Prep: NONE SEEN
YEAST WET PREP: NONE SEEN

## 2016-05-09 LAB — POCT PREGNANCY, URINE: Preg Test, Ur: NEGATIVE

## 2016-05-09 MED ORDER — IBUPROFEN 800 MG PO TABS: 800.0000 mg | ORAL_TABLET | Freq: Three times a day (TID) | ORAL | 0 refills | Status: DC | PRN

## 2016-05-09 NOTE — MAU Note (Signed)
For past 3 days, has been having stabbing pains on rt side, near incision.  C/s was 12 yrs ago.   Took Ibuprofen did not help

## 2016-05-09 NOTE — Discharge Instructions (Signed)

## 2016-05-09 NOTE — MAU Provider Note (Signed)
History     CSN: [id number]  Arrival date and time: 05/09/16 1809  Provider at bedside at 1858     Chief Complaint  Patient presents with  . Abdominal Pain   HPI  40 y.o. female that is not pregnant presents to the MAU with abdominal pain x 3 days. Pain is mainly in the RLQ. Says it started intermittently and is now more constant. She rates the pain at a 7-8/10. She associates this with diarrhea (that is chronic from her metformin). She denies fevers, chills, nausea, vomiting, dysuria, hematuria, increased frequency, vaginal bleeding, discharge. She has taken Ibuprofen-2 tablets every 3/4 hours with minimal relief. She associates this with diarrhea (that is chronic from her metformin).    Past Medical History:  Diagnosis Date  . Acute mastitis of left breast 04/14/2004  . Bulging lumbar disc   . Diabetes mellitus   . Fatty liver   . Fibrocystic breast 03/2007  . GBS carrier   . 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 H/O candidiasis   . H/O mastitis   . H/O varicella   . H/O: menorrhagia 2011  . Hx gestational diabetes 2002 and 2005  . Hx of candidal vulvovaginitis   . Hx: UTI (urinary tract infection)   . Hyperlipemia   . Macrosomia   . Sciatica   . Vitamin D deficiency   . Yeast infection     Past Surgical History:  Procedure Laterality Date  . CESAREAN SECTION  98.02, 05    x 3    Family History  Problem Relation Age of Onset  . Hypertension Mother   . Heart disease Mother   . Hyperlipidemia Mother   . Hyperlipidemia Sister   . Hyperlipidemia Brother   . Hyperlipidemia Sister   . Hypertension Maternal Grandfather   . Cancer Neg Hx     Social History  Substance Use Topics  . Smoking status: Never Smoker  . Smokeless tobacco: Never Used  . Alcohol use No    Allergies:  Allergies  Allergen Reactions  . Codeine Itching  . Darvocet [Propoxyphene N-Acetaminophen] Itching  . Dilaudid [Hydromorphone Hcl] Itching  . Percocet  [Oxycodone-Acetaminophen] Itching  . Triamcinolone Rash    Cream caused rash    Prescriptions Prior to Admission  Medication Sig Dispense Refill Last Dose  . calcium carbonate (TUMS - DOSED IN MG ELEMENTAL CALCIUM) 500 MG chewable tablet Chew 2 tablets by mouth daily as needed for indigestion or heartburn.   Past Week at Unknown time  . cholecalciferol (VITAMIN D) 1000 UNITS tablet Take 1,000 Units by mouth daily.    05/08/2016 at Unknown time  . diclofenac (VOLTAREN) 75 MG EC tablet Take 1 tablet (75 mg total) by mouth 2 (two) times daily. (Patient taking differently: Take 75 mg by mouth 2 (two) times daily as needed for mild pain. ) 60 tablet 0 Past Month at Unknown time  . ibuprofen (ADVIL,MOTRIN) 200 MG tablet Take 400 mg by mouth every 6 (six) hours as needed for mild pain.   05/09/2016 at Unknown time  . levonorgestrel (MIRENA) 20 MCG/24HR IUD 1 each by Intrauterine route once.   Continuous  . metFORMIN (GLUCOPHAGE XR) 750 MG 24 hr tablet Take 2 tablets (1,500 mg total) by mouth daily after supper. 60 tablet 2 Past Week at Unknown time  . metFORMIN (GLUCOPHAGE) 1000 MG tablet Take 1,000 mg by mouth once.   05/09/2016 at Unknown time  . simvastatin (ZOCOR) 10   MG tablet Take 10 mg by mouth at bedtime.   05/08/2016 at Unknown time  . Blood Glucose Monitoring Suppl (TRUE METRIX METER) W/DEVICE KIT Used as instructed 1 kit 0 Taking  . glucose blood (TRUE METRIX BLOOD GLUCOSE TEST) test strip Use as instructed 100 each 12 Taking  . TRUEPLUS LANCETS 28G MISC Use as directed 100 each 12 Taking    Review of Systems  Constitutional: Negative.   HENT: Negative.   Eyes: Negative.   Respiratory: Negative.   Cardiovascular: Negative.   Gastrointestinal: Positive for diarrhea (patient says this is chronic from metformin).  Genitourinary: Negative.   Skin: Negative.   Neurological: Negative.    Physical Exam   Blood pressure 110/66, pulse 66, temperature 98.1 F (36.7 C), temperature source Oral,  resp. rate 18, weight 74.6 kg (164 lb 8 oz).  Physical Exam  Constitutional: She is oriented to person, place, and time. She appears well-developed and well-nourished.  Cardiovascular: Normal rate, regular rhythm and normal heart sounds.   Respiratory: Effort normal and breath sounds normal.  GI: Soft. Bowel sounds are normal. There is tenderness in the right lower quadrant, suprapubic area and left lower quadrant. There is no rigidity, no rebound and no guarding.  Lower abdominal tenderness but patient was much more tender in RLQ than the rest of the abdomen.   Neurological: She is alert and oriented to person, place, and time.  Skin: Skin is warm and dry.    MAU Course  Procedures None  MDM UA to rule out pregnancy Likely IUD causing uterine cramping Pelvic exam to visualize stings and ensure IUD placement.  Afebrile  Assessment and Plan  40 y.o. [id number] that is currently not pregnant presenting to the MAU with lower abdominal pain.   Salomon Mast 05/09/2016, 7:07 PM   I have seen this patient with the PA student. Please see my note for complete documentation.   Mathis Bud  8:13 PM 05/09/16

## 2016-05-09 NOTE — MAU Note (Signed)
Has had other IUDs and had no issues; this IUD is a 79 old;

## 2016-05-09 NOTE — MAU Note (Signed)
C/o R lower abdominal pain that started 3 days ago; denies any bleeding or spotting but pain is 9; pt has a mirena IUD and she is concerned that the IUD has moved;

## 2016-05-09 NOTE — MAU Provider Note (Signed)
History     CSN: [id number]  Arrival date and time: 05/09/16 1809   None     Chief Complaint  Patient presents with  . Abdominal Pain    40 y.o. female that is not pregnant presents to the MAU with abdominal pain x 3 days. Pain is mainly in the RLQ. Says it started intermittently and is now more constant. She rates the pain at a 7-8/10. She associates this with diarrhea (that is chronic from her metformin). She denies fevers, chills, nausea, vomiting, dysuria, hematuria, increased frequency, vaginal bleeding, discharge. She has taken Ibuprofen-2 tablets every 3/4 hours with minimal relief. She associates this with diarrhea (that is chronic from her metformin).     Past Medical History:  Diagnosis Date  . Acute mastitis of left breast 04/14/2004  . Bulging lumbar disc   . Diabetes mellitus   . Fatty liver   . Fibrocystic breast 03/2007  . GBS carrier   . 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 H/O candidiasis   . H/O mastitis   . H/O varicella   . H/O: menorrhagia 2011  . Hx gestational diabetes 2002 and 2005  . Hx of candidal vulvovaginitis   . Hx: UTI (urinary tract infection)   . Hyperlipemia   . Macrosomia   . Sciatica   . Vitamin D deficiency   . Yeast infection     Past Surgical History:  Procedure Laterality Date  . CESAREAN SECTION  98.02, 05    x 3    Family History  Problem Relation Age of Onset  . Hypertension Mother   . Heart disease Mother   . Hyperlipidemia Mother   . Hyperlipidemia Sister   . Hyperlipidemia Brother   . Hyperlipidemia Sister   . Hypertension Maternal Grandfather   . Cancer Neg Hx     Social History  Substance Use Topics  . Smoking status: Never Smoker  . Smokeless tobacco: Never Used  . Alcohol use No    Allergies:  Allergies  Allergen Reactions  . Codeine Itching  . Darvocet [Propoxyphene N-Acetaminophen] Itching  . Dilaudid [Hydromorphone Hcl] Itching  . Percocet [Oxycodone-Acetaminophen]  Itching  . Triamcinolone Rash    Cream caused rash    Prescriptions Prior to Admission  Medication Sig Dispense Refill Last Dose  . calcium carbonate (TUMS - DOSED IN MG ELEMENTAL CALCIUM) 500 MG chewable tablet Chew 2 tablets by mouth daily as needed for indigestion or heartburn.   Past Week at Unknown time  . cholecalciferol (VITAMIN D) 1000 UNITS tablet Take 1,000 Units by mouth daily.    05/08/2016 at Unknown time  . diclofenac (VOLTAREN) 75 MG EC tablet Take 1 tablet (75 mg total) by mouth 2 (two) times daily. (Patient taking differently: Take 75 mg by mouth 2 (two) times daily as needed for mild pain. ) 60 tablet 0 Past Month at Unknown time  . ibuprofen (ADVIL,MOTRIN) 200 MG tablet Take 400 mg by mouth every 6 (six) hours as needed for mild pain.   05/09/2016 at Unknown time  . levonorgestrel (MIRENA) 20 MCG/24HR IUD 1 each by Intrauterine route once.   Continuous  . metFORMIN (GLUCOPHAGE XR) 750 MG 24 hr tablet Take 2 tablets (1,500 mg total) by mouth daily after supper. 60 tablet 2 Past Week at Unknown time  . metFORMIN (GLUCOPHAGE) 1000 MG tablet Take 1,000 mg by mouth once.   05/09/2016 at Unknown time  . simvastatin (ZOCOR) 10 MG tablet Take   10 mg by mouth at bedtime.   05/08/2016 at Unknown time  . Blood Glucose Monitoring Suppl (TRUE METRIX METER) W/DEVICE KIT Used as instructed 1 kit 0 Taking  . glucose blood (TRUE METRIX BLOOD GLUCOSE TEST) test strip Use as instructed 100 each 12 Taking  . TRUEPLUS LANCETS 28G MISC Use as directed 100 each 12 Taking    Review of Systems  Constitutional: Negative for chills and fever.  Gastrointestinal: Positive for abdominal pain and diarrhea. Negative for constipation, nausea and vomiting.  Genitourinary: Negative for dysuria, frequency and urgency.   Physical Exam   Blood pressure 110/66, pulse 66, temperature 98.1 F (36.7 C), temperature source Oral, resp. rate 18, weight 164 lb 8 oz (74.6 kg).  Physical Exam  Nursing note and vitals  reviewed. Constitutional: She is oriented to person, place, and time. She appears well-developed and well-nourished. No distress.  HENT:  Head: Normocephalic.  Cardiovascular: Normal rate.   Respiratory: Effort normal.  GI: Soft. There is no tenderness. There is no rebound.  Genitourinary:  Genitourinary Comments:  External: no lesion Vagina: small amount of white discharge Cervix: pink, smooth, no CMT, IUD seen at the os.  Uterus: NSSC Adnexa: Right adnexal tenderness Left: non-tender    Neurological: She is alert and oriented to person, place, and time.  Skin: Skin is warm and dry.  Psychiatric: She has a normal mood and affect.   Results for orders placed or performed during the hospital encounter of 05/09/16 (from the past 24 hour(s))  Urinalysis, Routine w reflex microscopic (not at [facility])     Status: Abnormal   Collection Time: 05/09/16  6:30 PM  Result Value Ref Range   Color, Urine YELLOW YELLOW   APPearance CLEAR CLEAR   Specific Gravity, Urine 1.020 1.005 - 1.030   pH 5.5 5.0 - 8.0   Glucose, UA NEGATIVE NEGATIVE mg/dL   Hgb urine dipstick NEGATIVE NEGATIVE   Bilirubin Urine NEGATIVE NEGATIVE   Ketones, ur 15 (A) NEGATIVE mg/dL   Protein, ur NEGATIVE NEGATIVE mg/dL   Nitrite NEGATIVE NEGATIVE   Leukocytes, UA NEGATIVE NEGATIVE  Pregnancy, urine POC     Status: None   Collection Time: 05/09/16  6:53 PM  Result Value Ref Range   Preg Test, Ur NEGATIVE NEGATIVE  Wet prep, genital     Status: Abnormal   Collection Time: 05/09/16  7:56 PM  Result Value Ref Range   Yeast Wet Prep HPF POC NONE SEEN NONE SEEN   Trich, Wet Prep NONE SEEN NONE SEEN   Clue Cells Wet Prep HPF POC NONE SEEN NONE SEEN   WBC, Wet Prep HPF POC MANY (A) NONE SEEN   Sperm NONE SEEN   CBC     Status: None   Collection Time: 05/09/16  8:11 PM  Result Value Ref Range   WBC 10.3 4.0 - 10.5 K/uL   RBC 4.53 3.87 - 5.11 MIL/uL   Hemoglobin 13.2 12.0 - 15.0 g/dL   HCT 39.4 36.0 - 46.0 %   MCV  87.0 78.0 - 100.0 fL   MCH 29.1 26.0 - 34.0 pg   MCHC 33.5 30.0 - 36.0 g/dL   RDW 13.4 11.5 - 15.5 %   Platelets 243 150 - 400 K/uL   Us Transvaginal Non-ob  Result Date: 05/09/2016 CLINICAL DATA:  Right upper quadrant pain x3 days, IUD EXAM: TRANSABDOMINAL AND TRANSVAGINAL ULTRASOUND OF PELVIS TECHNIQUE: Both transabdominal and transvaginal ultrasound examinations of the pelvis were performed. Transabdominal technique was performed for global   imaging of the pelvis including uterus, ovaries, adnexal regions, and pelvic cul-de-sac. It was necessary to proceed with endovaginal exam following the transabdominal exam to visualize the endometrium. COMPARISON:  01/02/2016 FINDINGS: Uterus Measurements: 7.5 x 5.0 x 5.5 cm. 1.9 x 1.2 x 1.3 cm subserosal fibroid in the left anterior uterine body. Endometrium Thickness: 7 mm. IUD in satisfactory position in the uterine fundus. Right ovary Measurements: 3.3 x 1.8 x 3.4 cm. Normal appearance/no adnexal mass. Left ovary Measurements: 2.4 x 1.8 x 2.1 cm. Normal appearance/no adnexal mass. Other findings No abnormal free fluid. IMPRESSION: IUD in satisfactory position in the uterine fundus. 1.9 cm subserosal fibroid in the left anterior uterine body. Bilateral ovaries are within normal limits. Electronically Signed   By: Sriyesh  Krishnan M.D.   On: 05/09/2016 20:56  Us Pelvis Complete  Result Date: 05/09/2016 CLINICAL DATA:  Right upper quadrant pain x3 days, IUD EXAM: TRANSABDOMINAL AND TRANSVAGINAL ULTRASOUND OF PELVIS TECHNIQUE: Both transabdominal and transvaginal ultrasound examinations of the pelvis were performed. Transabdominal technique was performed for global imaging of the pelvis including uterus, ovaries, adnexal regions, and pelvic cul-de-sac. It was necessary to proceed with endovaginal exam following the transabdominal exam to visualize the endometrium. COMPARISON:  01/02/2016 FINDINGS: Uterus Measurements: 7.5 x 5.0 x 5.5 cm. 1.9 x 1.2 x 1.3 cm  subserosal fibroid in the left anterior uterine body. Endometrium Thickness: 7 mm. IUD in satisfactory position in the uterine fundus. Right ovary Measurements: 3.3 x 1.8 x 3.4 cm. Normal appearance/no adnexal mass. Left ovary Measurements: 2.4 x 1.8 x 2.1 cm. Normal appearance/no adnexal mass. Other findings No abnormal free fluid. IMPRESSION: IUD in satisfactory position in the uterine fundus. 1.9 cm subserosal fibroid in the left anterior uterine body. Bilateral ovaries are within normal limits. Electronically Signed   By: Sriyesh  Krishnan M.D.   On: 05/09/2016 20:56    MAU Course  Procedures  MDM   Assessment and Plan   1. Pelvic pain in female   2. Adnexal pain   3. IUD (intrauterine device) in place    DC home Comfort measures reviewed  RX: ibuprofen 800mg PRN #30  Return to MAU as needed FU with OB as planned  Follow-up Information    [facility]   Specialty:  Obstetrics and Gynecology Contact information: [address] [place] [address] [phone]           Hogan, Heather Donovan 05/09/2016, 8:14 PM  

## 2016-05-10 LAB — GC/CHLAMYDIA PROBE AMP (~~LOC~~) NOT AT ARMC
Chlamydia: NEGATIVE
Neisseria Gonorrhea: NEGATIVE

## 2016-06-25 ENCOUNTER — Telehealth: Payer: Self-pay | Admitting: Family Medicine

## 2016-06-25 DIAGNOSIS — H6093 Unspecified otitis externa, bilateral: Secondary | ICD-10-CM

## 2016-06-25 NOTE — Telephone Encounter (Signed)
Pt. Called requesting a Rx for fluocinonide ear drops. Pt. States that when she gets water in her ears it is itchy. Please f/u with pt.

## 2016-06-27 NOTE — Telephone Encounter (Signed)
Pt. Called requesting a Rx for fluocinonide ear drops. Pt. States that when she gets water in her ears it is itchy and is now having pain. Please f/u with pt.

## 2016-06-27 NOTE — Telephone Encounter (Signed)
Will route to PCP 

## 2016-06-28 MED ORDER — FLUOCINOLONE ACETONIDE 0.01 % OT OIL: 5.0000 [drp] | TOPICAL_OIL | Freq: Two times a day (BID) | OTIC | 0 refills | Status: DC

## 2016-06-28 NOTE — Telephone Encounter (Signed)
fluocinolone otic drops ordered and sent to patient's pharmacy

## 2016-07-02 ENCOUNTER — Other Ambulatory Visit: Payer: Self-pay

## 2016-07-02 DIAGNOSIS — H6093 Unspecified otitis externa, bilateral: Secondary | ICD-10-CM

## 2016-07-02 MED ORDER — FLUOCINOLONE ACETONIDE 0.01 % OT OIL
5.0000 [drp] | TOPICAL_OIL | Freq: Two times a day (BID) | OTIC | 0 refills | Status: DC
Start: 2016-07-02 — End: 2016-07-19

## 2016-07-02 NOTE — Telephone Encounter (Signed)
Pt was called and informed of medication being sent over to pharmacy. Pt stated that she wants the medication to go to [facility] on [address]. I sent over the medication on 9/18

## 2016-07-06 MED FILL — metFORMIN HCL 500 MG TABS: 500 | 30 days supply | Qty: 90 | Fill #1

## 2016-07-06 MED FILL — TRUE METRIX GLUCOSE TEST ST: 25 days supply | Qty: 100 | Fill #3

## 2016-07-06 MED FILL — SIMVASTATIN 10 MG TABLET: 10 | 30 days supply | Qty: 30 | Fill #1

## 2016-07-12 NOTE — Telephone Encounter (Signed)
Pt came in stating the wrong medication was sent to [facility]   Pt requested that fluocinonide (LIDEX) 0.05 be filled, but the Rx ordered was Fluocinolone Acetonide 0.01 % OIL  Pt requesting the original Rx requested be sent to [facility]

## 2016-07-16 MED ORDER — FLUOCINONIDE 0.05 % EX OINT: 1.0000 "application " | TOPICAL_OINTMENT | Freq: Two times a day (BID) | CUTANEOUS | 3 refills | Status: DC

## 2016-07-16 MED ORDER — FLUOCINONIDE 0.05 % EX OINT
1.0000 | TOPICAL_OINTMENT | Freq: Two times a day (BID) | CUTANEOUS | 3 refills | Status: DC
Start: 2016-07-16 — End: 2016-07-16

## 2016-07-16 NOTE — Telephone Encounter (Signed)
Sent requested lidex to patient's pharmacy

## 2016-07-19 ENCOUNTER — Other Ambulatory Visit: Payer: Self-pay | Admitting: Family Medicine

## 2016-07-19 MED ORDER — FLUOCINONIDE 0.05 % EX CREA: 1.0000 "application " | TOPICAL_CREAM | Freq: Two times a day (BID) | CUTANEOUS | 2 refills | Status: DC

## 2016-08-10 ENCOUNTER — Other Ambulatory Visit: Payer: Self-pay | Admitting: Pharmacist

## 2016-08-10 ENCOUNTER — Other Ambulatory Visit: Payer: Self-pay | Admitting: Family Medicine

## 2016-08-10 DIAGNOSIS — R7303 Prediabetes: Secondary | ICD-10-CM

## 2016-08-10 DIAGNOSIS — K760 Fatty (change of) liver, not elsewhere classified: Secondary | ICD-10-CM

## 2016-08-10 MED ORDER — METFORMIN HCL ER 750 MG PO TB24: 1500.0000 mg | ORAL_TABLET | Freq: Every day | ORAL | 0 refills | Status: DC

## 2016-08-14 ENCOUNTER — Other Ambulatory Visit: Payer: Self-pay | Admitting: Pharmacist

## 2016-08-14 ENCOUNTER — Telehealth: Payer: Self-pay | Admitting: Family Medicine

## 2016-08-14 DIAGNOSIS — R7303 Prediabetes: Secondary | ICD-10-CM

## 2016-08-14 MED ORDER — SIMVASTATIN 10 MG PO TABS: 10.0000 mg | ORAL_TABLET | Freq: Every day | ORAL | 0 refills | Status: DC

## 2016-08-14 MED ORDER — METFORMIN HCL ER 750 MG PO TB24: 1500.0000 mg | ORAL_TABLET | Freq: Every day | ORAL | 0 refills | Status: DC

## 2016-08-14 NOTE — Telephone Encounter (Signed)
Patient is ask

## 2016-08-15 ENCOUNTER — Telehealth: Payer: Self-pay | Admitting: Family Medicine

## 2016-08-15 MED ORDER — METFORMIN HCL 1000 MG PO TABS: 1000.0000 mg | ORAL_TABLET | Freq: Two times a day (BID) | ORAL | 0 refills | Status: DC

## 2016-08-15 MED FILL — metFORMIN HCL 1000 MG TABS: 1000 | 30 days supply | Qty: 60 | Fill #0

## 2016-08-15 NOTE — Telephone Encounter (Signed)
Metformin switched from XR to IR

## 2016-08-15 NOTE — Telephone Encounter (Signed)
Patient called and is requesting to be put on regular metformin and not 24 hr metformin. With 24 hr metformin patient says the medication stays on her stomach

## 2016-08-16 ENCOUNTER — Telehealth: Payer: Self-pay | Admitting: Family Medicine

## 2016-08-16 NOTE — Telephone Encounter (Signed)
Patient called the office to speak with nurse regarding her sugar levels. Pt stated that it's been high for the past week. Please follow up. No appts available at the moment.   Thank you

## 2016-08-20 MED FILL — SIMVASTATIN 10 MG TABLET: 10 | 90 days supply | Qty: 90 | Fill #0

## 2016-08-31 ENCOUNTER — Encounter: Payer: Self-pay | Admitting: Family Medicine

## 2016-08-31 ENCOUNTER — Ambulatory Visit: Payer: Self-pay | Attending: Family Medicine | Admitting: Family Medicine

## 2016-08-31 VITALS — BP 125/85 | HR 76 | Temp 98.2°F | Ht 63.0 in | Wt 165.0 lb

## 2016-08-31 DIAGNOSIS — G44209 Tension-type headache, unspecified, not intractable: Secondary | ICD-10-CM

## 2016-08-31 DIAGNOSIS — Z7984 Long term (current) use of oral hypoglycemic drugs: Secondary | ICD-10-CM | POA: Insufficient documentation

## 2016-08-31 DIAGNOSIS — N898 Other specified noninflammatory disorders of vagina: Secondary | ICD-10-CM | POA: Insufficient documentation

## 2016-08-31 DIAGNOSIS — L298 Other pruritus: Secondary | ICD-10-CM

## 2016-08-31 DIAGNOSIS — Z1231 Encounter for screening mammogram for malignant neoplasm of breast: Secondary | ICD-10-CM

## 2016-08-31 DIAGNOSIS — R7303 Prediabetes: Secondary | ICD-10-CM | POA: Insufficient documentation

## 2016-08-31 LAB — GLUCOSE, POCT (MANUAL RESULT ENTRY): POC Glucose: 112 mg/dl — AB (ref 70–99)

## 2016-08-31 LAB — POCT GLYCOSYLATED HEMOGLOBIN (HGB A1C): HEMOGLOBIN A1C: 6

## 2016-08-31 MED ORDER — METFORMIN HCL 1000 MG PO TABS: 1000.0000 mg | ORAL_TABLET | Freq: Two times a day (BID) | ORAL | 11 refills | Status: DC

## 2016-08-31 MED ORDER — FLUOCINONIDE 0.05 % EX CREA: 1.0000 "application " | TOPICAL_CREAM | Freq: Two times a day (BID) | CUTANEOUS | 2 refills | Status: DC

## 2016-08-31 MED ORDER — FLUOCINONIDE 0.05 % EX CREA: 1.0000 "application " | TOPICAL_CREAM | Freq: Two times a day (BID) | CUTANEOUS | 5 refills | Status: DC

## 2016-08-31 MED ORDER — FLUCONAZOLE 150 MG PO TABS: 150.0000 mg | ORAL_TABLET | ORAL | 0 refills | Status: DC

## 2016-08-31 MED ORDER — SIMVASTATIN 10 MG PO TABS: 10.0000 mg | ORAL_TABLET | Freq: Every day | ORAL | 11 refills | Status: DC

## 2016-08-31 NOTE — Assessment & Plan Note (Signed)
Still in pre-diabetic range Continue metformin

## 2016-08-31 NOTE — Assessment & Plan Note (Signed)
Tension type HA Normal BP Normal head and neck exam  Continue ibuprofen

## 2016-08-31 NOTE — Assessment & Plan Note (Signed)
Suspect yeast Treat with diflucan

## 2016-08-31 NOTE — Patient Instructions (Addendum)
Jennifer Jones was seen today for diabetes.  Diagnoses and all orders for this visit:  Prediabetes -     POCT glucose (manual entry) -     POCT glycosylated hemoglobin (Hb A1C)  Visit for screening mammogram -     MM DIGITAL SCREENING BILATERAL; Future  Vagina itching -     fluconazole (DIFLUCAN) 150 MG tablet; Take 1 tablet (150 mg total) by mouth every 3 (three) days.  Tension headache  Other orders -     metFORMIN (GLUCOPHAGE) 1000 MG tablet; Take 1 tablet (1,000 mg total) by mouth 2 (two) times daily with a meal. -     simvastatin (ZOCOR) 10 MG tablet; Take 1 tablet (10 mg total) by mouth at bedtime. -     fluocinonide cream (LIDEX) 0.05 %; Apply 1 application topically 2 (two) times daily.    F/u in 6 months for pre-diabetes  Dr. Adrian Blackwater

## 2016-08-31 NOTE — Telephone Encounter (Signed)
Patient seen in office today. 

## 2016-08-31 NOTE — Telephone Encounter (Signed)
Will forward to pcp

## 2016-08-31 NOTE — Progress Notes (Signed)
Subjective:  Patient ID: Jennifer Jones, female    DOB: 1976/09/06  Age: 40 y.o. MRN: [id number]  CC: Diabetes   HPI Jennifer Jones presents for    1. Prediabetes on metformin  Disease Monitoring  Blood Sugar Ranges:   Fasting: 120   Postprandial 110  Polyuria: no   Visual problems: no   Medication Compliance: yes  Medication Side Effects  Hypoglycemia: no    2. Vaginal itching: x 1 month. No discharge. No lesions.   3. Stress: Maternal grandmother recently diagnosed with terminal cancer. She is stressed and worried. She is having HA, nightly for past week. Relieved with ibuprofen. She request screening mammogram. She is planning to travel home to help with mother with her grandmother.    Social History  Substance Use Topics  . Smoking status: Never Smoker  . Smokeless tobacco: Never Used  . Alcohol use No    Outpatient Medications Prior to Visit  Medication Sig Dispense Refill  . Blood Glucose Monitoring Suppl (TRUE METRIX METER) W/DEVICE KIT Used as instructed 1 kit 0  . calcium carbonate (TUMS - DOSED IN MG ELEMENTAL CALCIUM) 500 MG chewable tablet Chew 2 tablets by mouth daily as needed for indigestion or heartburn.    . cholecalciferol (VITAMIN D) 1000 UNITS tablet Take 1,000 Units by mouth daily.     . diclofenac (VOLTAREN) 75 MG EC tablet Take 1 tablet (75 mg total) by mouth 2 (two) times daily. (Patient taking differently: Take 75 mg by mouth 2 (two) times daily as needed for mild pain. ) 60 tablet 0  . fluocinonide cream (LIDEX) 9.48 % Apply 1 application topically 2 (two) times daily. 60 g 2  . glucose blood (TRUE METRIX BLOOD GLUCOSE TEST) test strip Use as instructed 100 each 12  . levonorgestrel (MIRENA) 20 MCG/24HR IUD 1 each by Intrauterine route once.    . metFORMIN (GLUCOPHAGE) 1000 MG tablet Take 1 tablet (1,000 mg total) by mouth 2 (two) times daily with a meal. 60 tablet 0  . simvastatin (ZOCOR) 10 MG tablet Take 1 tablet (10 mg total) by mouth  at bedtime. 90 tablet 0  . TRUEPLUS LANCETS 28G MISC Use as directed 100 each 12  . ibuprofen (ADVIL,MOTRIN) 200 MG tablet Take 400 mg by mouth every 6 (six) hours as needed for mild pain.    Marland Kitchen ibuprofen (ADVIL,MOTRIN) 800 MG tablet Take 1 tablet (800 mg total) by mouth every 8 (eight) hours as needed. (Patient not taking: Reported on 08/31/2016) 30 tablet 0   No facility-administered medications prior to visit.     ROS Review of Systems  Constitutional: Negative for chills and fever.  Eyes: Negative for visual disturbance.  Respiratory: Negative for shortness of breath.   Cardiovascular: Negative for chest pain.  Gastrointestinal: Negative for abdominal pain and blood in stool.  Genitourinary:       Vaginal itching   Musculoskeletal: Positive for back pain. Negative for arthralgias.  Skin: Negative for rash.  Allergic/Immunologic: Negative for immunocompromised state.  Neurological: Positive for headaches.  Hematological: Negative for adenopathy. Does not bruise/bleed easily.  Psychiatric/Behavioral: Negative for dysphoric mood and suicidal ideas.    Objective:  BP 125/85 (BP Location: Left Arm, Patient Position: Sitting, Cuff Size: Small)   Pulse 76   Temp 98.2 F (36.8 C) (Oral)   Ht _0  (1.6 m)   Wt 165 lb (74.8 kg)   SpO2 97%   BMI 29.23 kg/m   BP/Weight 08/31/2016 0/16/5537 01/20/2706  Systolic BP  967 591 638  Diastolic BP 85 64 79  Wt. (Lbs) 165 164.5 156  BMI 29.23 29.14 27.64   Physical Exam  Constitutional: She is oriented to person, place, and time. She appears well-developed and well-nourished. No distress.  HENT:  Head: Normocephalic and atraumatic.  Right Ear: Tympanic membrane and ear canal normal.  Left Ear: Tympanic membrane and ear canal normal.  Nose: No mucosal edema.  Mouth/Throat: Oropharynx is clear and moist.  Eyes: Conjunctivae are normal. Pupils are equal, round, and reactive to light.  Cardiovascular: Normal rate, regular rhythm, normal  heart sounds and intact distal pulses.   Pulmonary/Chest: Effort normal and breath sounds normal.  Musculoskeletal: She exhibits no edema.  Neurological: She is alert and oriented to person, place, and time.  Skin: Skin is warm and dry. No rash noted.  Psychiatric: She has a normal mood and affect.   Lab Results  Component Value Date   HGBA1C 5.7 04/19/2016   Lab Results  Component Value Date   HGBA1C 6.0 08/31/2016    CBG 112  Assessment & Plan:   Jennifer Jones was seen today for diabetes.  Diagnoses and all orders for this visit:  Prediabetes -     POCT glucose (manual entry) -     POCT glycosylated hemoglobin (Hb A1C)  Visit for screening mammogram -     MM DIGITAL SCREENING BILATERAL; Future  Other orders -     metFORMIN (GLUCOPHAGE) 1000 MG tablet; Take 1 tablet (1,000 mg total) by mouth 2 (two) times daily with a meal. -     simvastatin (ZOCOR) 10 MG tablet; Take 1 tablet (10 mg total) by mouth at bedtime. -     fluocinonide cream (LIDEX) 0.05 %; Apply 1 application topically 2 (two) times daily.   No orders of the defined types were placed in this encounter.   Follow-up: No Follow-up on file.   Boykin Nearing MD

## 2016-08-31 NOTE — Progress Notes (Signed)
Pt is here today to follow up on diabetes. Pt states that she is having vaginal itching.  Pt needs refill on fluocinonide, and metformin,simvastatin.  Pt declined flu shot.

## 2016-09-28 ENCOUNTER — Ambulatory Visit: Payer: Self-pay | Attending: Internal Medicine

## 2016-12-24 ENCOUNTER — Encounter: Payer: Self-pay | Admitting: Family Medicine

## 2016-12-24 ENCOUNTER — Ambulatory Visit: Payer: Self-pay | Attending: Family Medicine | Admitting: Family Medicine

## 2016-12-24 VITALS — BP 108/74 | HR 78 | Temp 98.5°F | Ht 63.0 in | Wt 164.4 lb

## 2016-12-24 DIAGNOSIS — K760 Fatty (change of) liver, not elsewhere classified: Secondary | ICD-10-CM | POA: Insufficient documentation

## 2016-12-24 DIAGNOSIS — Z7984 Long term (current) use of oral hypoglycemic drugs: Secondary | ICD-10-CM | POA: Insufficient documentation

## 2016-12-24 DIAGNOSIS — M545 Low back pain, unspecified: Secondary | ICD-10-CM

## 2016-12-24 DIAGNOSIS — R232 Flushing: Secondary | ICD-10-CM | POA: Insufficient documentation

## 2016-12-24 DIAGNOSIS — Z975 Presence of (intrauterine) contraceptive device: Secondary | ICD-10-CM | POA: Insufficient documentation

## 2016-12-24 DIAGNOSIS — G8929 Other chronic pain: Secondary | ICD-10-CM | POA: Insufficient documentation

## 2016-12-24 DIAGNOSIS — R7303 Prediabetes: Secondary | ICD-10-CM | POA: Insufficient documentation

## 2016-12-24 DIAGNOSIS — N951 Menopausal and female climacteric states: Secondary | ICD-10-CM | POA: Insufficient documentation

## 2016-12-24 DIAGNOSIS — K644 Residual hemorrhoidal skin tags: Secondary | ICD-10-CM

## 2016-12-24 DIAGNOSIS — F40240 Claustrophobia: Secondary | ICD-10-CM | POA: Insufficient documentation

## 2016-12-24 LAB — VITAMIN B12: Vitamin B-12: 637 pg/mL (ref 200–1100)

## 2016-12-24 LAB — LIPID PANEL
CHOL/HDL RATIO: 4.3 ratio (ref <5.0)
Cholesterol: 155 mg/dL (ref <200)
HDL: 36 mg/dL — AB (ref >50)
LDL Cholesterol: 98 mg/dL (ref <100)
Triglycerides: 107 mg/dL (ref <150)
VLDL: 21 mg/dL (ref <30)

## 2016-12-24 LAB — POCT GLYCOSYLATED HEMOGLOBIN (HGB A1C): HEMOGLOBIN A1C: 6

## 2016-12-24 LAB — TSH: TSH: 0.54 m[IU]/L

## 2016-12-24 NOTE — Assessment & Plan Note (Signed)
Referral to psychologist

## 2016-12-24 NOTE — Progress Notes (Signed)
Subjective:  Patient ID: Jennifer Jones, female    DOB: [birth year]  Age: 41 y.o. MRN: [id number]  CC: Diabetes   HPI Alline Pio presents for    1. Prediabetes on metformin. She reports intermittent sharp pains and tingling in feet. She reports feeling hot at night. No sweating.  Disease Monitoring  Blood Sugar Ranges:   Fasting: 120   Postprandial 110  Polyuria: no   Visual problems: no   Medication Compliance: yes  Medication Side Effects  Hypoglycemia: no   2.  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 Fear is debilitating. She is unable to clean under her son's bed due to the darkness and confinement. She feels panicky when she hears or sees darkness or confinement.   3. Chronic low back pain: improved but persistent. Her pain comes and goes. Pain is light now but severe with prolonged walking.      Social History  Substance Use Topics  . Smoking status: Never Smoker  . Smokeless tobacco: Never Used  . Alcohol use No    Outpatient Medications Prior to Visit  Medication Sig Dispense Refill  . Blood Glucose Monitoring Suppl (TRUE METRIX METER) W/DEVICE KIT Used as instructed 1 kit 0  . calcium carbonate (TUMS - DOSED IN MG ELEMENTAL CALCIUM) 500 MG chewable tablet Chew 2 tablets by mouth daily as needed for indigestion or heartburn.    . cholecalciferol (VITAMIN D) 1000 UNITS tablet Take 1,000 Units by mouth daily.     . fluocinonide cream (LIDEX) 1.02 % Apply 1 application topically 2 (two) times daily. 60 g 5  . glucose blood (TRUE METRIX BLOOD GLUCOSE TEST) test strip Use as instructed 100 each 12  . ibuprofen (ADVIL,MOTRIN) 200 MG tablet Take 400 mg by mouth every 6 (six) hours as needed for mild pain.    Marland Kitchen levonorgestrel (MIRENA) 20 MCG/24HR IUD 1 each by Intrauterine route once.    . metFORMIN (GLUCOPHAGE) 1000 MG tablet Take 1 tablet (1,000 mg total) by mouth 2 (two) times daily with a meal.  60 tablet 11  . simvastatin (ZOCOR) 10 MG tablet Take 1 tablet (10 mg total) by mouth at bedtime. 30 tablet 11  . TRUEPLUS LANCETS 28G MISC Use as directed 100 each 12  . diclofenac (VOLTAREN) 75 MG EC tablet Take 1 tablet (75 mg total) by mouth 2 (two) times daily. (Patient not taking: Reported on 12/24/2016) 60 tablet 0  . fluconazole (DIFLUCAN) 150 MG tablet Take 1 tablet (150 mg total) by mouth every 3 (three) days. (Patient not taking: Reported on 12/24/2016) 2 tablet 0  . ibuprofen (ADVIL,MOTRIN) 800 MG tablet Take 1 tablet (800 mg total) by mouth every 8 (eight) hours as needed. (Patient not taking: Reported on 08/31/2016) 30 tablet 0   No facility-administered medications prior to visit.     ROS Review of Systems  Constitutional: Negative for chills and fever.  Eyes: Negative for visual disturbance.  Respiratory: Negative for shortness of breath.   Cardiovascular: Negative for chest pain.  Gastrointestinal: Negative for abdominal pain and blood in stool.  Musculoskeletal: Positive for back pain. Negative for arthralgias.  Skin: Negative for rash.  Allergic/Immunologic: Negative for immunocompromised state.  Neurological: Negative for headaches.  Hematological: Negative for adenopathy. Does not bruise/bleed easily.  Psychiatric/Behavioral: Negative for dysphoric mood and suicidal ideas.    Objective:  BP 108/74 (BP Location: Left Arm, Patient Position: Sitting, Cuff Size: Small)   Pulse  78   Temp 98.5 F (36.9 C) (Oral)   Ht _0  (1.6 m)   Wt 164 lb 6.4 oz (74.6 kg)   SpO2 98%   BMI 29.12 kg/m   BP/Weight 12/24/2016 08/31/2016 5/97/4163  Systolic BP 845 364 680  Diastolic BP 74 85 64  Wt. (Lbs) 164.4 165 164.5  BMI 29.12 29.23 29.14   Physical Exam  Constitutional: She is oriented to person, place, and time. She appears well-developed and well-nourished. No distress.  HENT:  Head: Normocephalic and atraumatic.  Right Ear: Tympanic membrane and ear canal normal.    Left Ear: Tympanic membrane and ear canal normal.  Nose: No mucosal edema.  Mouth/Throat: Oropharynx is clear and moist.  Eyes: Conjunctivae are normal. Pupils are equal, round, and reactive to light.  Neck: No thyromegaly present.  Cardiovascular: Normal rate, regular rhythm, normal heart sounds and intact distal pulses.   Pulmonary/Chest: Effort normal and breath sounds normal.  Musculoskeletal: She exhibits no edema.  Back Exam: Back: Normal Curvature, no deformities or CVA tenderness  Paraspinal Tenderness: left lumbar   LE Strength 5/5  LE Sensation: in tact  LE Reflexes 2+ and symmetric  Straight leg raise: negative    Neurological: She is alert and oriented to person, place, and time.  Skin: Skin is warm and dry. No rash noted.  Psychiatric: She has a normal mood and affect.   Lab Results  Component Value Date   HGBA1C 6.0 08/31/2016   Lab Results  Component Value Date   HGBA1C 6.0 12/24/2016     Assessment & Plan:   Lakelyn was seen today for diabetes.  Diagnoses and all orders for this visit:  Prediabetes -     POCT glycosylated hemoglobin (Hb A1C) -     Vitamin B12 -     Lipid Panel  Chronic left-sided low back pain without sciatica -     Cancel: Ambulatory referral to Pain Clinic -     Ambulatory referral to Pain Clinic  Fatty liver disease, nonalcoholic -     Lipid Panel  Hot flashes -     TSH -     FSH/LH -     Estrogens, Total   No orders of the defined types were placed in this encounter.   Follow-up: Return in about 6 weeks (around 02/04/2017) for phobia.   Boykin Nearing MD

## 2016-12-24 NOTE — Assessment & Plan Note (Signed)
Persistent pain interfering with quality of life Plan: Pain mange ment referral for non-opioid pain management

## 2016-12-24 NOTE — Assessment & Plan Note (Signed)
Suspect perimenopausal Labs per orders

## 2016-12-24 NOTE — Patient Instructions (Addendum)
Jennifer Jones was seen today for diabetes.  Diagnoses and all orders for this visit:  Prediabetes -     POCT glycosylated hemoglobin (Hb A1C) -     Vitamin B12 -     Lipid Panel  Chronic left-sided low back pain without sciatica -     Cancel: Ambulatory referral to Pain Clinic -     Ambulatory referral to Pain Clinic  Fatty liver disease, nonalcoholic -     Lipid Panel  Hot flashes -     TSH -     FSH/LH -     Estrogens, Total   F/u in 6 weeks for phobia  Dr. Adrian Blackwater

## 2016-12-24 NOTE — Assessment & Plan Note (Addendum)
Stable with A1c of 6 Continue metformin Checking vit B12 level due to paresthesias

## 2016-12-25 LAB — FSH/LH
FSH: 4.2 m[IU]/mL
LH: 11.3 m[IU]/mL

## 2016-12-28 LAB — ESTROGENS, TOTAL: Estrogen: 698.9 pg/mL — ABNORMAL HIGH

## 2017-01-03 ENCOUNTER — Telehealth: Payer: Self-pay

## 2017-01-03 NOTE — Telephone Encounter (Signed)
Pt was called and informed of lab results. Pt request a copy of lab results.

## 2017-01-10 ENCOUNTER — Telehealth: Payer: Self-pay | Admitting: Family Medicine

## 2017-01-10 NOTE — Telephone Encounter (Signed)
Pt  Has appt scheduled for 02/04/17.

## 2017-01-10 NOTE — Telephone Encounter (Signed)
Pt states that she got bloodwork results and the estrogen level is high. Would like to speak to Dr about results and what her course of action should be. Please f/u.

## 2017-01-10 NOTE — Telephone Encounter (Signed)
Could you please schedule an appointment with PCP?

## 2017-01-29 ENCOUNTER — Other Ambulatory Visit: Payer: Self-pay | Admitting: Family Medicine

## 2017-01-29 ENCOUNTER — Ambulatory Visit: Payer: Self-pay | Attending: Family Medicine | Admitting: Family Medicine

## 2017-01-29 ENCOUNTER — Encounter: Payer: Self-pay | Admitting: Family Medicine

## 2017-01-29 VITALS — BP 118/79 | HR 81 | Temp 98.3°F | Ht 63.0 in | Wt 166.0 lb

## 2017-01-29 DIAGNOSIS — R7303 Prediabetes: Secondary | ICD-10-CM

## 2017-01-29 DIAGNOSIS — R350 Frequency of micturition: Secondary | ICD-10-CM | POA: Insufficient documentation

## 2017-01-29 DIAGNOSIS — N898 Other specified noninflammatory disorders of vagina: Secondary | ICD-10-CM | POA: Insufficient documentation

## 2017-01-29 DIAGNOSIS — E559 Vitamin D deficiency, unspecified: Secondary | ICD-10-CM | POA: Insufficient documentation

## 2017-01-29 DIAGNOSIS — E280 Estrogen excess: Secondary | ICD-10-CM | POA: Insufficient documentation

## 2017-01-29 DIAGNOSIS — Z7984 Long term (current) use of oral hypoglycemic drugs: Secondary | ICD-10-CM | POA: Insufficient documentation

## 2017-01-29 DIAGNOSIS — Z8639 Personal history of other endocrine, nutritional and metabolic disease: Secondary | ICD-10-CM

## 2017-01-29 LAB — POCT URINALYSIS DIPSTICK
Bilirubin, UA: NEGATIVE
Blood, UA: NEGATIVE
GLUCOSE UA: NEGATIVE
Ketones, UA: NEGATIVE
Leukocytes, UA: NEGATIVE
NITRITE UA: NEGATIVE
PROTEIN UA: NEGATIVE
Spec Grav, UA: 1.015 (ref 1.010–1.025)
UROBILINOGEN UA: 0.2 U/dL
pH, UA: 7 (ref 5.0–8.0)

## 2017-01-29 LAB — GLUCOSE, POCT (MANUAL RESULT ENTRY): POC GLUCOSE: 112 mg/dL — AB (ref 70–99)

## 2017-01-29 MED ORDER — GLUCOSE BLOOD VI STRP: ORAL_STRIP | 12 refills | Status: DC

## 2017-01-29 MED FILL — TRUE METRIX TEST STRIP: 30 days supply | Qty: 100 | Fill #0

## 2017-01-29 NOTE — Assessment & Plan Note (Signed)
Frequent urination Normal UA Normal abdominal and pelvic  Plan: Urine cytology and culture Wet prep also done

## 2017-01-29 NOTE — Progress Notes (Signed)
Subjective:  Patient ID: Jennifer Jones, female    DOB: [birth year]  Age: 41 y.o. MRN: [id number]  CC: Urinary Frequency   HPI Dejana Pugsley has pre diabetes she  presents for   1. Urinary frequency: frequency without dysuria for the past 3 nights. She has decreased urinary flow.  She reports associated lower abdominal pain. No blood in urine. No fever, chills or flank pain. She denies constipation.   2. Elevated estrogen level: estrogen level was 698 on last labs.   Social History  Substance Use Topics  . Smoking status: Never Smoker  . Smokeless tobacco: Never Used  . Alcohol use No    Outpatient Medications Prior to Visit  Medication Sig Dispense Refill  . Blood Glucose Monitoring Suppl (TRUE METRIX METER) W/DEVICE KIT Used as instructed 1 kit 0  . calcium carbonate (TUMS - DOSED IN MG ELEMENTAL CALCIUM) 500 MG chewable tablet Chew 2 tablets by mouth daily as needed for indigestion or heartburn.    . cholecalciferol (VITAMIN D) 1000 UNITS tablet Take 1,000 Units by mouth daily.     . fluocinonide cream (LIDEX) 3.38 % Apply 1 application topically 2 (two) times daily. 60 g 5  . glucose blood (TRUE METRIX BLOOD GLUCOSE TEST) test strip Use as instructed 100 each 12  . ibuprofen (ADVIL,MOTRIN) 200 MG tablet Take 400 mg by mouth every 6 (six) hours as needed for mild pain.    Marland Kitchen levonorgestrel (MIRENA) 20 MCG/24HR IUD 1 each by Intrauterine route once.    . metFORMIN (GLUCOPHAGE) 1000 MG tablet Take 1 tablet (1,000 mg total) by mouth 2 (two) times daily with a meal. 60 tablet 11  . simvastatin (ZOCOR) 10 MG tablet Take 1 tablet (10 mg total) by mouth at bedtime. 30 tablet 11  . TRUEPLUS LANCETS 28G MISC Use as directed 100 each 12   No facility-administered medications prior to visit.     ROS Review of Systems  Constitutional: Negative for chills and fever.  Eyes: Negative for visual disturbance.  Respiratory: Negative for shortness of breath.   Cardiovascular: Negative  for chest pain.  Gastrointestinal: Positive for abdominal pain. Negative for blood in stool.  Genitourinary: Positive for frequency.  Musculoskeletal: Negative for arthralgias and back pain.  Skin: Negative for rash.  Allergic/Immunologic: Negative for immunocompromised state.  Hematological: Negative for adenopathy. Does not bruise/bleed easily.  Psychiatric/Behavioral: Negative for dysphoric mood and suicidal ideas.    Objective:  BP 118/79   Pulse 81   Temp 98.3 F (36.8 C) (Oral)   Ht _0  (1.6 m)   Wt 166 lb (75.3 kg)   SpO2 98%   BMI 29.41 kg/m   BP/Weight 01/29/2017 12/24/2016 25/02/3975  Systolic BP 734 193 790  Diastolic BP 79 74 85  Wt. (Lbs) 166 164.4 165  BMI 29.41 29.12 29.23     Physical Exam  Constitutional: She is oriented to person, place, and time. She appears well-developed and well-nourished. No distress.  HENT:  Head: Normocephalic and atraumatic.  Cardiovascular: Normal rate, regular rhythm, normal heart sounds and intact distal pulses.   Pulmonary/Chest: Effort normal and breath sounds normal.  Abdominal: Soft. Bowel sounds are normal. She exhibits no distension and no mass. There is no tenderness. There is no rebound and no guarding.  Genitourinary: Uterus normal. Pelvic exam was performed with patient prone. There is no rash, tenderness or lesion on the right labia. There is no rash, tenderness or lesion on the left labia. Cervix exhibits no motion tenderness,  no discharge and no friability. Vaginal discharge (scant mucoid ) found.    Musculoskeletal: She exhibits no edema.  Lymphadenopathy:       Right: No inguinal adenopathy present.       Left: No inguinal adenopathy present.  Neurological: She is alert and oriented to person, place, and time.  Skin: Skin is warm and dry. No rash noted.  Psychiatric: She has a normal mood and affect.    Lab Results  Component Value Date   HGBA1C 6.0 12/24/2016   CBG 112   UA: normal  Assessment & Plan:   Safia was seen today for urinary frequency.  Diagnoses and all orders for this visit:  Prediabetes -     glucose blood (TRUE METRIX BLOOD GLUCOSE TEST) test strip; Use as instructed -     POCT glucose (manual entry)  Urinary frequency -     POCT urinalysis dipstick -     Urine culture -     Urine cytology ancillary only  Estrogen excess -     Estrogens, Total  History of vitamin D deficiency -     Vitamin D, 25-hydroxy  Vaginal discharge -     Cervicovaginal ancillary only     No orders of the defined types were placed in this encounter.   Follow-up: Return in about 6 weeks (around 03/12/2017) for urinary frequency .   Boykin Nearing MD

## 2017-01-29 NOTE — Patient Instructions (Addendum)
Jennifer Jones was seen today for urinary frequency.  Diagnoses and all orders for this visit:  Prediabetes -     glucose blood (TRUE METRIX BLOOD GLUCOSE TEST) test strip; Use as instructed -     POCT glucose (manual entry)  Urinary frequency -     POCT urinalysis dipstick -     Urine culture -     Urine cytology ancillary only  Estrogen excess -     Estrogens, Total  History of vitamin D deficiency -     Vitamin D, 25-hydroxy  Vaginal discharge -     Cervicovaginal ancillary only   The urinalysis is normal without sign of stone or infection I have sent the urine for culture  f/u in 6 weeks for urinary frequency sooner if needed   Dr. Adrian Blackwater

## 2017-01-31 ENCOUNTER — Ambulatory Visit (HOSPITAL_BASED_OUTPATIENT_CLINIC_OR_DEPARTMENT_OTHER): Payer: Self-pay | Admitting: Physical Medicine & Rehabilitation

## 2017-01-31 ENCOUNTER — Encounter: Payer: Self-pay | Attending: Physical Medicine & Rehabilitation

## 2017-01-31 ENCOUNTER — Encounter: Payer: Self-pay | Admitting: Physical Medicine & Rehabilitation

## 2017-01-31 VITALS — BP 103/78 | HR 78 | Resp 14

## 2017-01-31 DIAGNOSIS — G8929 Other chronic pain: Secondary | ICD-10-CM | POA: Insufficient documentation

## 2017-01-31 DIAGNOSIS — M5136 Other intervertebral disc degeneration, lumbar region: Secondary | ICD-10-CM | POA: Insufficient documentation

## 2017-01-31 DIAGNOSIS — M533 Sacrococcygeal disorders, not elsewhere classified: Secondary | ICD-10-CM

## 2017-01-31 DIAGNOSIS — E559 Vitamin D deficiency, unspecified: Secondary | ICD-10-CM | POA: Insufficient documentation

## 2017-01-31 DIAGNOSIS — E119 Type 2 diabetes mellitus without complications: Secondary | ICD-10-CM | POA: Insufficient documentation

## 2017-01-31 DIAGNOSIS — Z87440 Personal history of urinary (tract) infections: Secondary | ICD-10-CM | POA: Insufficient documentation

## 2017-01-31 DIAGNOSIS — E785 Hyperlipidemia, unspecified: Secondary | ICD-10-CM | POA: Insufficient documentation

## 2017-01-31 DIAGNOSIS — K760 Fatty (change of) liver, not elsewhere classified: Secondary | ICD-10-CM | POA: Insufficient documentation

## 2017-01-31 DIAGNOSIS — Z22330 Carrier of Group B streptococcus: Secondary | ICD-10-CM | POA: Insufficient documentation

## 2017-01-31 DIAGNOSIS — Z809 Family history of malignant neoplasm, unspecified: Secondary | ICD-10-CM | POA: Insufficient documentation

## 2017-01-31 DIAGNOSIS — M51369 Other intervertebral disc degeneration, lumbar region without mention of lumbar back pain or lower extremity pain: Secondary | ICD-10-CM | POA: Insufficient documentation

## 2017-01-31 DIAGNOSIS — Z8249 Family history of ischemic heart disease and other diseases of the circulatory system: Secondary | ICD-10-CM | POA: Insufficient documentation

## 2017-01-31 DIAGNOSIS — Z8619 Personal history of other infectious and parasitic diseases: Secondary | ICD-10-CM | POA: Insufficient documentation

## 2017-01-31 LAB — URINE CULTURE

## 2017-01-31 LAB — URINE CYTOLOGY ANCILLARY ONLY
CHLAMYDIA, DNA PROBE: NEGATIVE
Neisseria Gonorrhea: NEGATIVE
Trichomonas: NEGATIVE

## 2017-01-31 LAB — CERVICOVAGINAL ANCILLARY ONLY: Wet Prep (BD Affirm): NEGATIVE

## 2017-01-31 NOTE — Patient Instructions (Addendum)
Sacroiliac (SI) Joint Injection Patient Information  Description: The sacroiliac joint connects the scrum (very low back and tailbone) to the ilium (a pelvic bone which also forms half of the hip joint).  Normally this joint experiences very little motion.  When this joint becomes inflamed or unstable low back and or hip and pelvis pain may result.  Injection of this joint with local anesthetics (numbing medicines) and steroids can provide diagnostic information and reduce pain.  This injection is performed with the aid of x-ray guidance into the tailbone area while you are lying on your stomach.   You may experience an electrical sensation down the leg while this is being done.  You may also experience numbness.  We also may ask if we are reproducing your normal pain during the injection.  Conditions which may be treated SI injection:   Low back, buttock, hip or leg pain  Preparation for the Injection:  1. Do not eat any solid food or dairy products within 8 hours of your appointment.  2. You may drink clear liquids up to 3 hours before appointment.  Clear liquids include water, black coffee, juice or soda.  No milk or cream please. 3. You may take your regular medications, including pain medications with a sip of water before your appointment.  Diabetics should hold regular insulin (if take separately) and take 1/2 normal NPH dose the morning of the procedure.  Carry some sugar containing items with you to your appointment. 4. A driver must accompany you and be prepared to drive you home after your procedure. 5. Bring all of your current medications with you. 6. An IV may be inserted and sedation may be given at the discretion of the physician. 7. A blood pressure cuff, EKG and other monitors will often be applied during the procedure.  Some patients may need to have extra oxygen administered for a short period.  8. You will be asked to provide medical information, including your allergies,  prior to the procedure.  We must know immediately if you are taking blood thinners (like Coumadin/Warfarin) or if you are allergic to IV iodine contrast (dye).  We must know if you could possible be pregnant.  Possible side effects:   Bleeding from needle site  Infection (rare, may require surgery)  Nerve injury (rare)  Numbness & tingling (temporary)  A brief convulsion or seizure  Light-headedness (temporary)  Pain at injection site (several days)  Decreased blood pressure (temporary)  Weakness in the leg (temporary)   Call if you experience:   New onset weakness or numbness of an extremity below the injection site that last more than 8 hours.  Hives or difficulty breathing ( go to the emergency room)  Inflammation or drainage at the injection site  Any new symptoms which are concerning to you  Please note:  Although the local anesthetic injected can often make your back/ hip/ buttock/ leg feel good for several hours after the injections, the pain will likely return.  It takes 3-7 days for steroids to work in the sacroiliac area.  You may not notice any pain relief for at least that one week.  If effective, we will often do a series of three injections spaced 3-6 weeks apart to maximally decrease your pain.  After the initial series, we generally will wait some months before a repeat injection of the same type.

## 2017-01-31 NOTE — Progress Notes (Signed)
Subjective:    Patient ID: Jennifer Jones, female    DOB: [birth year], 41 y.o.   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 General activity 6 Relation with others 6 Enjoyment of life 6 What TIME of day is your pain at its worst? daytime, evening Sleep (in general) Good  Pain is worse with: bending, sitting, standing and some activites Pain improves with: rest, heat/ice and medication Relief from Meds: 9  Mobility walk without assistance ability to climb steps?  yes do you drive?  yes  Function not employed: date last employed .  Neuro/Psych No problems in this area  Prior Studies CT/MRI new visit  Physicians involved in your care new visit   Family History  Problem Relation Age of Onset  . Hypertension Mother   . Heart disease Mother   . Hyperlipidemia Mother   . Hyperlipidemia Sister   . Hyperlipidemia Brother   . Hyperlipidemia Sister   . Hypertension Maternal Grandfather   . Cancer Maternal Grandmother    Social History   Social History  . Marital status: Legally Separated    Spouse name: N/A  . Number of children: 3  . Years of education: bachelor    Occupational History  . 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 Smoking status: Never Smoker  . Smokeless tobacco: Never Used  . Alcohol use No  . Drug use: No  . Sexual activity: Yes    Birth  control/ protection: IUD     Comment: MIRENA placed in 2012    Other Topics Concern  . None   Social History Narrative   Lives with 3 sons, husband.   Mom visits from [place].    Past Surgical History:  Procedure Laterality Date  . CESAREAN SECTION  98.02, 05    x 3   Past Medical History:  Diagnosis Date  . Acute mastitis of left breast 04/14/2004  . Bulging lumbar disc   . Diabetes mellitus   . Fatty liver   . Fibrocystic breast 03/2007  . GBS carrier   . 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 H/O candidiasis   . H/O mastitis   . H/O varicella   . H/O: menorrhagia 2011  . Hx gestational diabetes 2002 and 2005  . Hx of candidal vulvovaginitis   . Hx: UTI (urinary tract infection)   . Hyperlipemia   . Macrosomia   . Sciatica   . Vitamin D deficiency   . Yeast infection    BP 103/78 (BP Location: Left Arm, Patient Position: Sitting, Cuff Size: Normal)   Pulse 78   Resp 14   SpO2 96%   Opioid Risk Score:   Fall Risk Score:  `1  Depression  screen PHQ 2/9  Depression screen [facility] 2/9 01/29/2017 12/24/2016 08/31/2016 04/19/2016 12/05/2015 08/23/2015 07/08/2015  Decreased Interest 0 0 0 0 - 0 0  Down, Depressed, Hopeless 0 0 0 0 0 0 0  PHQ - 2 Score 0 0 0 0 0 0 0  Altered sleeping 0 0 0 - 0 - -  Tired, decreased energy 1 0 0 - 1 - -  Change in appetite 1 0 0 - 1 - -  Feeling bad or failure about yourself  0 0 0 - 0 - -  Trouble concentrating 0 0 0 - 0 - -  Moving slowly or fidgety/restless 0 0 0 - 0 - -  Suicidal thoughts 0 0 0 - 0 - -  PHQ-9 Score 2 0 0 - 2 - -    Review of Systems  Constitutional: Negative.   HENT: Negative.   Eyes: Negative.   Respiratory: Negative.   Cardiovascular: Negative.   Gastrointestinal: Negative.   Endocrine: Negative.   Genitourinary: Negative.   Musculoskeletal: Positive for back pain.  Allergic/Immunologic: Negative.   Neurological: Negative.   Hematological: Negative.   Psychiatric/Behavioral:  Negative.   All other systems reviewed and are negative.      Objective:   Physical Exam  Constitutional: She is oriented to person, place, and time. She appears well-developed and well-nourished. No distress.  HENT:  Head: Normocephalic and atraumatic.  Eyes: Conjunctivae and EOM are normal. Pupils are equal, round, and reactive to light.  Neck: Normal range of motion.  Cardiovascular: Normal rate, regular rhythm and normal heart sounds.  Exam reveals no friction rub.   No murmur heard. Pulmonary/Chest: Effort normal and breath sounds normal. No respiratory distress. She has no wheezes.  Abdominal: Soft. Bowel sounds are normal. She exhibits no distension. There is no tenderness.  Musculoskeletal:       Right hip: She exhibits decreased range of motion.       Left hip: She exhibits decreased range of motion.       Lumbar back: She exhibits decreased range of motion.  Full flexion and extension, however, decreased lateral bending and twisting  Negative straight leg raising  Mildly diminished hip internal rotation normal, external rotation  Positive Faber's, left greater than right sacroiliac area  Tenderness to palpation left PSIS  Neurological: She is alert and oriented to person, place, and time. She displays no atrophy. No sensory deficit. She exhibits normal muscle tone. Gait normal.  Reflex Scores:      Tricep reflexes are 2+ on the right side and 2+ on the left side.      Bicep reflexes are 2+ on the right side and 2+ on the left side.      Brachioradialis reflexes are 2+ on the right side and 2+ on the left side.      Patellar reflexes are 2+ on the right side and 2+ on the left side.      Achilles reflexes are 2+ on the right side and 2+ on the left side. Sensation pinprick intact bilateral C5, C6, C7, C8, L2, L3, L4, L5, S1 dermatomal distribution. Motor strength is 5/5 bilateral deltoid, biceps, triceps, grip, hip flexor, knee extensor, ankle dorsiflexor, plantar flexor   Psychiatric: She has a normal mood and affect. Her behavior is normal.  Nursing note and vitals reviewed.         Assessment & Plan:  1. Chronic low back pain. Her primary complaint his buttocks pain and this appears to be related  to left sacroiliac dysfunction. We'll do diagnostic/therapeutic sacroiliac injection under fluoroscopic guidance. Physical therapy for stabilization exercises.  We reviewed MRIs as well as spine models. Explained her location of pain in the different pathology suspected.  2. History of intermittent sciatic pain appears to be in the L3 distribution, correlating with her L3-L4 disc. If she has acute onset of sciatica, would benefit from L3-L4 transforaminal injection.

## 2017-02-02 LAB — VITAMIN D 25 HYDROXY (VIT D DEFICIENCY, FRACTURES): Vit D, 25-Hydroxy: 28.1 ng/mL — ABNORMAL LOW (ref 30.0–100.0)

## 2017-02-02 LAB — ESTROGENS, TOTAL: ESTROGEN: 188 pg/mL

## 2017-02-04 ENCOUNTER — Ambulatory Visit: Payer: Self-pay | Admitting: Family Medicine

## 2017-02-05 ENCOUNTER — Ambulatory Visit (INDEPENDENT_AMBULATORY_CARE_PROVIDER_SITE_OTHER): Payer: No Typology Code available for payment source | Admitting: Psychiatry

## 2017-02-05 VITALS — BP 126/72 | HR 74 | Ht 63.0 in | Wt 165.0 lb

## 2017-02-05 DIAGNOSIS — F40298 Other specified phobia: Secondary | ICD-10-CM

## 2017-02-05 DIAGNOSIS — Z818 Family history of other mental and behavioral disorders: Secondary | ICD-10-CM

## 2017-02-05 NOTE — Progress Notes (Signed)
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  She reports that in the context of lumbar disc disease, and the need for multiple MRIs over the past 2 years, she has developed a phobia of tight spaces. That this is affected her ability to take care of her housework without developing anxiety, and also has affected her ability to watch the news, as there are married of stories that involved people being trapped in cars, trapped in elevators, trapped in tight spaces.  She worries daily about being trapped in a tight space, and feels like the anxiety has affected her ability to enjoy her day.  She identifies episodes that are consistent with panic and severe anxiety when she is faced with issues that elicit a claustrophobia. She reports that she is wondering about therapies to help with her phobia. She denies any suicidal thoughts or homicidal thoughts. She denies any significant depressive symptoms. She reports that she sleeps fairly well at night. She reports that in the past she was on Zoloft, but this did not agree with her, and in some ways made it difficult for her to sleep at night. She does not have any episodes consistent with mania or psychosis. She does come across as a Research officer, trade union.  Day-to-day, she reports that she is no longer working. 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 She takes care of her 3 children, one of which is at college at [employer]. She also has a 43 year old and a 6 year old son  at home. She is separated from her husband. She has few social contacts and friends outside of her involvement with the [employer] that she attends.  I educated her on the options including exposure therapy and treatment with SSRI. She would like to start with individual therapy and exposure therapy, and agrees to follow-up with this writer in a couple months if she has had poor success with exposure. No other acute questions at this time.  I educated her on Lexapro, and the physiology of SSRI medications if she eventually decided she liked to proceed.  Associated Signs/Symptoms: Depression Symptoms:  anxiety, panic attacks, (Hypo) Manic Symptoms:  none Anxiety Symptoms:  Excessive Worry, Specific Phobias, Psychotic Symptoms:  none PTSD Symptoms: Negative  Past Psychiatric History: Prior history of anxiety, with partial benefit from zoloft  Previous Psychotropic Medications: Yes   Substance Abuse History in the last 12 months:  No.  Consequences of Substance Abuse: Negative  Past Medical History:  Past Medical History:  Diagnosis Date  . Acute mastitis of left breast 04/14/2004  . Bulging lumbar disc   . Diabetes mellitus   . Fatty liver   . Fibrocystic breast 03/2007  . GBS carrier   . 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 H/O candidiasis   . H/O mastitis   . H/O varicella   . H/O: menorrhagia 2011  . Hx gestational diabetes 2002 and 2005  . Hx of candidal vulvovaginitis   . Hx: UTI (urinary tract  infection)   . Hyperlipemia   . Macrosomia   . Sciatica   . Vitamin D deficiency   . Yeast infection     Past Surgical History:  Procedure Laterality Date  . CESAREAN SECTION  98.02, 05    x 3    Family Psychiatric History: sister -MDD  Family History:  Family History  Problem Relation Age of Onset  . Hypertension Mother   . Heart disease Mother   . Hyperlipidemia Mother   . Hyperlipidemia Sister   . Hyperlipidemia Brother   . Hyperlipidemia  Sister   . Hypertension Maternal Grandfather   . Cancer Maternal Grandmother     Social History:   Social History   Social History  . Marital status: Legally Separated    Spouse name: N/A  . Number of children: 3  . Years of education: bachelor    Occupational History  . 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 Smoking status: Never Smoker  . Smokeless tobacco: Never Used  . Alcohol use No  . Drug use: No  . Sexual activity: Yes    Birth control/ protection: IUD     Comment: MIRENA placed in 2012    Other Topics Concern  . Not on file   Social History Narrative   Lives with 3 sons, husband.   Mom visits from [place].     Additional Social History: from [place] orignially, in [place] 18 years  Allergies:   Allergies  Allergen Reactions  . Codeine Itching  . Darvocet [Propoxyphene N-Acetaminophen] Itching  . Dilaudid [Hydromorphone Hcl] Itching  . Percocet [Oxycodone-Acetaminophen] Itching  . Triamcinolone Rash    Cream caused rash    Metabolic Disorder Labs: Lab Results  Component Value Date   HGBA1C 6.0 12/24/2016   No results found for: PROLACTIN Lab Results  Component Value Date   CHOL 155 12/24/2016   TRIG 107 12/24/2016   HDL 36 (L) 12/24/2016   CHOLHDL 4.3 12/24/2016   VLDL 21 12/24/2016   LDLCALC 98 12/24/2016   LDLCALC 100 12/29/2015     Current Medications: Current Outpatient Prescriptions  Medication Sig Dispense Refill  . Blood Glucose Monitoring Suppl (TRUE METRIX METER) W/DEVICE KIT Used as instructed 1 kit 0  . calcium carbonate (TUMS - DOSED IN MG ELEMENTAL CALCIUM) 500 MG chewable tablet Chew 2 tablets by mouth daily as needed for indigestion or heartburn.    . cholecalciferol (VITAMIN D) 1000 UNITS tablet Take 1,000 Units by mouth daily.     Marland Kitchen glucose blood (TRUE METRIX BLOOD GLUCOSE TEST) test strip Use as instructed 100 each 12  . ibuprofen (ADVIL,MOTRIN) 200 MG tablet Take 400 mg by mouth every 6 (six)  hours as needed for mild pain.    Marland Kitchen levonorgestrel (MIRENA) 20 MCG/24HR IUD 1 each by Intrauterine route once.    . metFORMIN (GLUCOPHAGE) 1000 MG tablet Take 1 tablet (1,000 mg total) by mouth 2 (two) times daily with a meal. 60 tablet 11  . naproxen (NAPROSYN) 500 MG tablet Take 500 mg by mouth as needed.    . simvastatin (ZOCOR) 10 MG tablet Take 1 tablet (10 mg total) by mouth at bedtime. 30 tablet 11  . TRUEPLUS LANCETS 28G MISC Use as directed 100 each 12   No current facility-administered medications for this visit.     Neurologic: Headache: Negative Seizure: Negative Paresthesias:Negative  Musculoskeletal: Strength & Muscle Tone: within normal limits Gait & Station:  normal Patient leans: N/A  Psychiatric Specialty Exam: Review of Systems  Constitutional: Negative.   HENT: Negative.   Eyes: Negative.   Cardiovascular: Negative.   Gastrointestinal: Negative.   Musculoskeletal: Positive for back pain.  Neurological: Negative.   Psychiatric/Behavioral: The patient is nervous/anxious.     Blood pressure 126/72, pulse 74, height 5' 3"  (1.6 m), weight 165 lb (74.8 kg).Body mass index is 29.23 kg/m.  General Appearance: Well Groomed and Wearing headscarf  Eye Contact:  Good  Speech:  Clear and Coherent  Volume:  Normal  Mood:  Dysphoric  Affect:  Congruent  Thought Process:  Goal Directed  Orientation:  Full (Time, Place, and Person)  Thought Content:  Logical  Suicidal Thoughts:  No  Homicidal Thoughts:  No  Memory:  Immediate;   Fair  Judgement:  Good  Insight:  Good  Psychomotor Activity:  Normal  Concentration:  Concentration: Good  Recall:  Good  Fund of Knowledge:Good  Language: Good  Akathisia:  Negative  Handed:  Right  AIMS (if indicated):  0  Assets:  Communication Skills Desire for Improvement Financial Resources/Insurance Housing Intimacy Leisure Time Physical Health Resilience Social Support Transportation  ADL's:  Intact  Cognition: WNL   Sleep:  6-8 hours    Treatment Plan Summary: Jennifer Jones is a 41 year old female with a history of anxiety, who presents today for treatment options regarding specific phobia. She prefers not to be on any psychiatric medications, and wishes to proceed with individual therapy and exposure therapy. She agrees to follow-up with a provider in this office to initiate treatment. She does not have any acute safety issues, nor does she present with any substance use issues. We'll follow-up in 2 months and see how things are going, and of SSRI therapy would be indicated.  1. Specific phobia    Schedule with Leanne for therapy Consider SSRI, I educated the patient about Lexapro, and if she decides she wants to start medication, we can start with Lexapro 10 mg Follow-up in 2 months  Aundra Dubin, MD 4/24/20183:12 PM

## 2017-02-05 NOTE — Telephone Encounter (Signed)
4/24/18[facility] does not cover PT. Per Larene Beach Nifong patient does not have hospital financial assistance

## 2017-02-06 ENCOUNTER — Telehealth: Payer: Self-pay

## 2017-02-06 NOTE — Telephone Encounter (Signed)
Pt was called and informed of lab results and urine culture. Pt states that she is still having frequent urination and abdominal pain.

## 2017-02-14 ENCOUNTER — Telehealth: Payer: Self-pay | Admitting: Physical Therapy

## 2017-02-14 DIAGNOSIS — R350 Frequency of micturition: Secondary | ICD-10-CM

## 2017-02-14 NOTE — Telephone Encounter (Signed)
02/05/17 & 02/13/17 left message for pt to call to schedule PT eval. No return call

## 2017-02-14 NOTE — Telephone Encounter (Signed)
Called to patient Left VM requesting a call back to determine next step in evaluation She had normal, UA, CBG and urine culture in office Normal abdominal and pelvic exam as well  Alycia, please reach out to patient today or tomorrow if she does not call back.  Thank you,  Dr. Adrian Blackwater

## 2017-02-26 ENCOUNTER — Encounter: Payer: Self-pay | Admitting: Physical Medicine & Rehabilitation

## 2017-02-26 ENCOUNTER — Encounter: Payer: Self-pay | Attending: Physical Medicine & Rehabilitation

## 2017-02-26 ENCOUNTER — Ambulatory Visit (HOSPITAL_BASED_OUTPATIENT_CLINIC_OR_DEPARTMENT_OTHER): Payer: Self-pay | Admitting: Physical Medicine & Rehabilitation

## 2017-02-26 VITALS — BP 117/76 | HR 81

## 2017-02-26 DIAGNOSIS — Z22330 Carrier of Group B streptococcus: Secondary | ICD-10-CM | POA: Insufficient documentation

## 2017-02-26 DIAGNOSIS — E785 Hyperlipidemia, unspecified: Secondary | ICD-10-CM | POA: Insufficient documentation

## 2017-02-26 DIAGNOSIS — E559 Vitamin D deficiency, unspecified: Secondary | ICD-10-CM | POA: Insufficient documentation

## 2017-02-26 DIAGNOSIS — Z8249 Family history of ischemic heart disease and other diseases of the circulatory system: Secondary | ICD-10-CM | POA: Insufficient documentation

## 2017-02-26 DIAGNOSIS — M5136 Other intervertebral disc degeneration, lumbar region: Secondary | ICD-10-CM | POA: Insufficient documentation

## 2017-02-26 DIAGNOSIS — E119 Type 2 diabetes mellitus without complications: Secondary | ICD-10-CM | POA: Insufficient documentation

## 2017-02-26 DIAGNOSIS — K760 Fatty (change of) liver, not elsewhere classified: Secondary | ICD-10-CM | POA: Insufficient documentation

## 2017-02-26 DIAGNOSIS — G8929 Other chronic pain: Secondary | ICD-10-CM | POA: Insufficient documentation

## 2017-02-26 DIAGNOSIS — Z8619 Personal history of other infectious and parasitic diseases: Secondary | ICD-10-CM | POA: Insufficient documentation

## 2017-02-26 DIAGNOSIS — M533 Sacrococcygeal disorders, not elsewhere classified: Secondary | ICD-10-CM

## 2017-02-26 DIAGNOSIS — Z809 Family history of malignant neoplasm, unspecified: Secondary | ICD-10-CM | POA: Insufficient documentation

## 2017-02-26 DIAGNOSIS — Z87440 Personal history of urinary (tract) infections: Secondary | ICD-10-CM | POA: Insufficient documentation

## 2017-02-26 NOTE — Patient Instructions (Signed)
Sacroiliac injection was performed today. A combination of a naming medicine plus a cortisone medicine was injected. The injection was done under x-ray guidance. This procedure has been performed to help reduce low back and buttocks pain as well as potentially hip pain. The duration of this injection is variable lasting from hours to  Months. It may repeated if needed. 

## 2017-02-26 NOTE — Progress Notes (Signed)
  PROCEDURE RECORD [facility]   Name: Jennifer Jones DOB:July 05, 1976 MRN: [id number]  Date:02/26/2017  Physician: Alysia Penna, MD    Nurse/CMA: Wessling CMA  Allergies:  Allergies  Allergen Reactions  . Codeine Itching  . Darvocet [Propoxyphene N-Acetaminophen] Itching  . Dilaudid [Hydromorphone Hcl] Itching  . Percocet [Oxycodone-Acetaminophen] Itching  . Triamcinolone Rash    Cream caused rash    Consent Signed: Yes.    Is patient diabetic? No.  CBG today?   Pregnant: No. LMP: No LMP recorded. Patient is not currently having periods (Reason: IUD). (age 78-55)  Anticoagulants: no Anti-inflammatory: no Antibiotics: no  Procedure: Left Sacroiliac injection Position: Prone Start Time: 2:45pm End Time: 2:49pm Fluoro Time: 13  RN/CMA Bright CMA     Time 203pm 2:52pm    BP 117/76 116/72    Pulse 81 68    Respirations 16 16    O2 Sat 97 96    S/S 6 60/10    Pain Level 8/10      D/C home with Nou[place]ane (son), patient A & O X 3, D/C instructions reviewed, and sits independently.

## 2017-02-26 NOTE — Progress Notes (Signed)

## 2017-02-27 ENCOUNTER — Encounter: Payer: Self-pay | Admitting: Family Medicine

## 2017-02-27 NOTE — Telephone Encounter (Signed)
Urology referral placed

## 2017-02-27 NOTE — Telephone Encounter (Signed)
Pt was called and informed of lab results. Pt states that she needs a referral to urology.

## 2017-03-04 ENCOUNTER — Ambulatory Visit (INDEPENDENT_AMBULATORY_CARE_PROVIDER_SITE_OTHER): Payer: No Typology Code available for payment source | Admitting: Psychology

## 2017-03-04 ENCOUNTER — Encounter (HOSPITAL_COMMUNITY): Payer: Self-pay | Admitting: Psychology

## 2017-03-04 ENCOUNTER — Telehealth: Payer: Self-pay | Admitting: Family Medicine

## 2017-03-04 DIAGNOSIS — R350 Frequency of micturition: Secondary | ICD-10-CM

## 2017-03-04 DIAGNOSIS — F40298 Other specified phobia: Secondary | ICD-10-CM

## 2017-03-04 NOTE — Telephone Encounter (Signed)
Will route to PCP 

## 2017-03-04 NOTE — Telephone Encounter (Signed)
Patient called the office asking to speak with PCP in regards to frequent urination. Pt stated that she has already been seen for this condition and would like a referral to be placed. Pt prefers a female doctor. Pt has OC. Please follow up.   Thank you.
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
## 2017-03-04 NOTE — Telephone Encounter (Signed)
Please inform patient urology referral was already placed Since she has orange card,   I will also place a gyn referral as it is difficult to see urology with orange card

## 2017-03-05 ENCOUNTER — Telehealth: Payer: Self-pay | Admitting: Family Medicine

## 2017-03-05 NOTE — Telephone Encounter (Signed)
Mrs Bufano call me regarding her Urology referral I told her that since she has [place] card I only can sent 2 referrals per month and I'll send it next month she told me that she can't sleep ans she wants Dr Adrian Blackwater to sent her for [place] or ct to see what's going on . Please, call her thank you .

## 2017-03-06 ENCOUNTER — Ambulatory Visit (HOSPITAL_COMMUNITY)
Admission: EM | Admit: 2017-03-06 | Discharge: 2017-03-06 | Disposition: A | Discharge status: Other Acute Inpatient Hospital | Payer: Self-pay

## 2017-03-06 ENCOUNTER — Inpatient Hospital Stay (HOSPITAL_COMMUNITY)
Admission: AD | Admit: 2017-03-06 | Discharge: 2017-03-06 | Disposition: A | Discharge status: Home / Self Care | Payer: Self-pay | Source: Ambulatory Visit | Attending: Obstetrics and Gynecology | Admitting: Obstetrics and Gynecology

## 2017-03-06 ENCOUNTER — Encounter (HOSPITAL_COMMUNITY): Payer: Self-pay | Admitting: Student

## 2017-03-06 DIAGNOSIS — E119 Type 2 diabetes mellitus without complications: Secondary | ICD-10-CM | POA: Insufficient documentation

## 2017-03-06 DIAGNOSIS — Z7984 Long term (current) use of oral hypoglycemic drugs: Secondary | ICD-10-CM | POA: Insufficient documentation

## 2017-03-06 DIAGNOSIS — R3915 Urgency of urination: Secondary | ICD-10-CM

## 2017-03-06 DIAGNOSIS — Z885 Allergy status to narcotic agent status: Secondary | ICD-10-CM | POA: Insufficient documentation

## 2017-03-06 DIAGNOSIS — E785 Hyperlipidemia, unspecified: Secondary | ICD-10-CM | POA: Insufficient documentation

## 2017-03-06 DIAGNOSIS — E559 Vitamin D deficiency, unspecified: Secondary | ICD-10-CM | POA: Insufficient documentation

## 2017-03-06 DIAGNOSIS — Z79899 Other long term (current) drug therapy: Secondary | ICD-10-CM | POA: Insufficient documentation

## 2017-03-06 DIAGNOSIS — R350 Frequency of micturition: Secondary | ICD-10-CM | POA: Insufficient documentation

## 2017-03-06 DIAGNOSIS — R300 Dysuria: Secondary | ICD-10-CM | POA: Insufficient documentation

## 2017-03-06 LAB — URINALYSIS, ROUTINE W REFLEX MICROSCOPIC
BILIRUBIN URINE: NEGATIVE
GLUCOSE, UA: NEGATIVE mg/dL
HGB URINE DIPSTICK: NEGATIVE
KETONES UR: NEGATIVE mg/dL
Leukocytes, UA: NEGATIVE
Nitrite: NEGATIVE
PROTEIN: NEGATIVE mg/dL
Specific Gravity, Urine: 1.02 (ref 1.005–1.030)
pH: 6 (ref 5.0–8.0)

## 2017-03-06 LAB — POCT PREGNANCY, URINE: PREG TEST UR: NEGATIVE

## 2017-03-06 MED ORDER — PHENAZOPYRIDINE HCL 200 MG PO TABS: 200.0000 mg | ORAL_TABLET | Freq: Three times a day (TID) | ORAL | 0 refills | Status: DC

## 2017-03-06 MED ORDER — HYDROXYZINE HCL 25 MG PO TABS
25.0000 mg | ORAL_TABLET | Freq: Every day | ORAL | 0 refills | Status: DC
Start: 2017-03-06 — End: 2017-05-29

## 2017-03-06 NOTE — MAU Provider Note (Signed)
History     CSN: [id number]  Arrival date and time: 03/06/17 1324  First Provider Initiated Contact with Patient 03/06/17 1727      Chief Complaint  Patient presents with  . Urinary Frequency  . Dysuria   HPI Jennifer Jones is a 41 y.o. nonpregnant female who presents for urinary frequency & urgency. Symptoms began 2 months ago & have worsened over the last 2 days. Was previously evaluated by her PCP for this concern & is currently waiting for referral to urology. Also was told she would be referred to gynecology.  Reports having to void every hour & not feeling like she's able to completely empty her bladder. Symptoms are worse at night between 9 pm & 3 am. Has tried decreasing fluid intake without relief. Denies hematuria, vaginal discharge, vaginal bleeding, abdominal pain, n/v, fever/chills, or flank pain.   Past Medical History:  Diagnosis Date  . Bulging lumbar disc   . Diabetes mellitus, type II ([place])   . Fatty liver   . Fibrocystic breast 03/2007  . 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 H/O varicella   . H/O: menorrhagia 2011  . Hx gestational diabetes 2002 and 2005  . Hx of candidal vulvovaginitis   . Hx: UTI (urinary tract infection)   . Hyperlipemia   . Sciatica   . Vitamin D deficiency     Past Surgical History:  Procedure Laterality Date  . CESAREAN SECTION  98.02, 05    x 3    Family History  Problem Relation Age of Onset  . Hypertension Mother   . Heart disease Mother   . Hyperlipidemia Mother   . Depression Father   . Hyperlipidemia Sister   . Depression Sister   . Hyperlipidemia Brother   . Anxiety disorder Son   . Hyperlipidemia Sister   . Depression Sister   . Hypertension Maternal Grandfather   . Cancer Maternal Grandmother     Social History  Substance Use Topics  . Smoking status: Never Smoker  . Smokeless tobacco: Never Used  . Alcohol use No    Allergies:  Allergies  Allergen Reactions  . Codeine Itching  . Darvocet  [Propoxyphene N-Acetaminophen] Itching  . Dilaudid [Hydromorphone Hcl] Itching  . Percocet [Oxycodone-Acetaminophen] Itching  . Triamcinolone Rash    Cream caused rash    Prescriptions Prior to Admission  Medication Sig Dispense Refill Last Dose  . Blood Glucose Monitoring Suppl (TRUE METRIX METER) W/DEVICE KIT Used as instructed 1 kit 0 Taking  . calcium carbonate (TUMS - DOSED IN MG ELEMENTAL CALCIUM) 500 MG chewable tablet Chew 2 tablets by mouth daily as needed for indigestion or heartburn.   Taking  . cholecalciferol (VITAMIN D) 1000 UNITS tablet Take 1,000 Units by mouth daily.    Taking  . glucose blood (TRUE METRIX BLOOD GLUCOSE TEST) test strip Use as instructed 100 each 12 Taking  . ibuprofen (ADVIL,MOTRIN) 200 MG tablet Take 400 mg by mouth every 6 (six) hours as needed for mild pain.   Taking  . levonorgestrel (MIRENA) 20 MCG/24HR IUD 1 each by Intrauterine route once.   Taking  . metFORMIN (GLUCOPHAGE) 1000 MG tablet Take 1 tablet (1,000 mg total) by mouth 2 (two) times daily with a meal. 60 tablet 11 Taking  . naproxen (NAPROSYN) 500 MG tablet Take 500 mg by mouth as needed.   Taking  . simvastatin (ZOCOR) 10 MG tablet Take 1 tablet (10 mg total) by mouth at bedtime. 30 tablet 11 Taking  .  TRUEPLUS LANCETS 28G MISC Use as directed 100 each 12 Taking    Review of Systems  Constitutional: Negative.   Gastrointestinal: Negative.   Genitourinary: Positive for dysuria, frequency and urgency. Negative for dyspareunia, flank pain, hematuria, vaginal bleeding, vaginal discharge and vaginal pain.  Musculoskeletal: Negative.    Physical Exam   Blood pressure 106/63, pulse 78, temperature 97.6 F (36.4 C), temperature source Oral, resp. rate 16, weight 161 lb (73 kg), SpO2 100 %.  Physical Exam  Nursing note and vitals reviewed. Constitutional: She is oriented to person, place, and time. She appears well-developed and well-nourished. No distress.  HENT:  Head: Normocephalic and  atraumatic.  Eyes: Conjunctivae are normal. Right eye exhibits no discharge. Left eye exhibits no discharge. No scleral icterus.  Neck: Normal range of motion.  Cardiovascular: Normal rate, regular rhythm and normal heart sounds.   No murmur heard. Respiratory: Effort normal and breath sounds normal. No respiratory distress. She has no wheezes.  GI: Soft. Bowel sounds are normal. She exhibits no distension. There is no tenderness. There is no rebound, no guarding and no CVA tenderness.  Neurological: She is alert and oriented to person, place, and time.  Skin: Skin is warm and dry. She is not diaphoretic.  Psychiatric: She has a normal mood and affect. Her behavior is normal. Judgment and thought content normal.    MAU Course  Procedures Results for orders placed or performed during the hospital encounter of 03/06/17 (from the past 24 hour(s))  Urinalysis, Routine w reflex microscopic (not at [facility])     Status: None   Collection Time: 03/06/17  1:31 PM  Result Value Ref Range   Color, Urine YELLOW YELLOW   APPearance CLEAR CLEAR   Specific Gravity, Urine 1.020 1.005 - 1.030   pH 6.0 5.0 - 8.0   Glucose, UA NEGATIVE NEGATIVE mg/dL   Hgb urine dipstick NEGATIVE NEGATIVE   Bilirubin Urine NEGATIVE NEGATIVE   Ketones, ur NEGATIVE NEGATIVE mg/dL   Protein, ur NEGATIVE NEGATIVE mg/dL   Nitrite NEGATIVE NEGATIVE   Leukocytes, UA NEGATIVE NEGATIVE  Pregnancy, urine POC     Status: None   Collection Time: 03/06/17  2:36 PM  Result Value Ref Range   Preg Test, Ur NEGATIVE NEGATIVE    MDM UPT negative U/a -- no evidence of infection, previously had negative urine culture Patient had pelvic exam, vaginal swabs, & urine culture by PCP last month. Declines repeat exam today but does want imaging done. Discussed with patient that there is not an emergent need for imaging through this department tonight but that I could order and outpatient ultrasound to be done prior to her gyn appointment. Pt  agreeable to that plan.   Assessment and Plan  A: 1. Urinary urgency   2. Urinary frequency   3. Dysuria    P: Discharge home Rx pyridium & vistaril qhs Outpatient ultrasound ordered Msg to [place] for appt F/u with PCP regarding urology referral If symptoms worsen go to ED  Jorje Guild 03/06/2017, 5:26 PM

## 2017-03-06 NOTE — Discharge Instructions (Signed)
Urinary Frequency, Adult Urinary frequency means urinating more often than usual. People with urinary frequency urinate at least 8 times in 24 hours, even if they drink a normal amount of fluid. Although they urinate more often than normal, the total amount of urine produced in a day may be normal. Urinary frequency is also called pollakiuria. What are the causes? This condition may be caused by:  A urinary tract infection.  Obesity.  Bladder problems, such as bladder stones.  Caffeine or alcohol.  Eating food or drinking fluids that irritate the bladder. These include coffee, tea, soda, artificial sweeteners, citrus, tomato-based foods, and chocolate.  Certain medicines, such as medicines that help the body get rid of extra fluid (diuretics).  Muscle or nerve weakness.  Overactive bladder.  Chronic diabetes.  Interstitial cystitis.  In men, problems with the prostate, such as an enlarged prostate.  In women, pregnancy. In some cases, the cause may not be known. What increases the risk? This condition is more likely to develop in:  Women who have gone through menopause.  Men with prostate problems.  People with a disease or injury that affects the nerves or spinal cord.  People who have or have had a condition that affects the brain, such as a stroke. What are the signs or symptoms? Symptoms of this condition include:  Feeling an urgent need to urinate often. The stress and anxiety of needing to find a bathroom quickly can make this urge worse.  Urinating 8 or more times in 24 hours.  Urinating as often as every 1 to 2 hours. How is this diagnosed? This condition is diagnosed based on your symptoms, your medical history, and a physical exam. You may have tests, such as:  Blood tests.  Urine tests.  Imaging tests, such as X-rays or ultrasounds.  A bladder test.  A test of your neurological system. This is the body system that senses the need to urinate.  A  test to check for problems in the urethra and bladder called cystoscopy. You may also be asked to keep a bladder diary. A bladder diary is a record of what you eat and drink, how often you urinate, and how much you urinate. You may need to see a health care provider who specializes in conditions of the urinary tract (urologist) or kidneys (nephrologist). How is this treated? Treatment for this condition depends on the cause. Sometimes the condition goes away on its own and treatment is not necessary. If treatment is needed, it may include:  Taking medicine.  Learning exercises that strengthen the muscles that help control urination.  Following a bladder training program. This may include:  Learning to delay going to the bathroom.  Double urinating (voiding). This helps if you are not completely emptying your bladder.  Scheduled voiding.  Making diet changes, such as:  Avoiding caffeine.  Drinking fewer fluids, especially alcohol.  Not drinking in the evening.  Not having foods or drinks that may irritate the bladder.  Eating foods that help prevent or ease constipation. Constipation can make this condition worse.  Having the nerves in your bladder stimulated. There are two options for stimulating the nerves to your bladder:  Outpatient electrical nerve stimulation. This is done by your health care provider.  Surgery to implant a bladder pacemaker. The pacemaker helps to control the urge to urinate. Follow these instructions at home:  Keep a bladder diary if told to by your health care provider.  Take over-the-counter and prescription medicines only as   told by your health care provider.  Do any exercises as told by your health care provider.  Follow a bladder training program as told by your health care provider.  Make any recommended diet changes.  Keep all follow-up visits as told by your health care provider. This is important. Contact a health care provider  if:  You start urinating more often.  You feel pain or irritation when you urinate.  You notice blood in your urine.  Your urine looks cloudy.  You develop a fever.  You begin vomiting. Get help right away if:  You are unable to urinate. This information is not intended to replace advice given to you by your health care provider. Make sure you discuss any questions you have with your health care provider. Document Released: 07/28/2009 Document Revised: 11/02/2015 Document Reviewed: 04/27/2015 Elsevier Interactive Patient Education  2017 [employer]  

## 2017-03-06 NOTE — MAU Note (Signed)
Patient c/o increased urinary frequency and pain. States has been going on a while but the past 2 days pain and frequency has gotten worse. Went to her PCP and was told she didn't have an infection and needed to have some imaging. Patient states she cant wait until her appointment that she feels miserable.

## 2017-03-07 ENCOUNTER — Telehealth: Payer: Self-pay | Admitting: General Practice

## 2017-03-07 NOTE — Telephone Encounter (Signed)
-----   Message from Jorje Guild, NP sent at 03/06/2017  6:17 PM EDT ----- Admin: pt needs f/u appt with MD to discuss symptoms & ultrasound report. Was getting referral to gyn & urology for urinary complaints. Pt insistent on imaging from MAU -- outpatient u/s ordered  Clinical: ordered gyn outpatient ultrasound on this patient  Thanks!

## 2017-03-07 NOTE — Telephone Encounter (Signed)
Will forward to pcp

## 2017-03-07 NOTE — Telephone Encounter (Signed)
Ultrasound scheduled for 5/31 @ 3pm. Called patient and informed her of appt. Patient verbalized understanding & had no questions

## 2017-03-08 NOTE — Telephone Encounter (Signed)
  Patient has been to [facility]  on 03/06/17 and has outpatient ultrasound ordered- pelvic and transvaginal  She has normal UA at [facility] with no blood.  There is no indication at this time for MRI or CT.  She is advised to try the prescribed hydroxyzine and pyridium and to complete ordered ultrasound.

## 2017-03-12 ENCOUNTER — Telehealth: Payer: Self-pay | Admitting: Family Medicine

## 2017-03-12 ENCOUNTER — Ambulatory Visit (INDEPENDENT_AMBULATORY_CARE_PROVIDER_SITE_OTHER): Payer: No Typology Code available for payment source | Admitting: Psychology

## 2017-03-12 DIAGNOSIS — F40298 Other specified phobia: Secondary | ICD-10-CM

## 2017-03-12 DIAGNOSIS — R350 Frequency of micturition: Secondary | ICD-10-CM

## 2017-03-12 NOTE — Progress Notes (Signed)
   THERAPIST PROGRESS NOTE  Session Time: 10.05am-10.55am  Participation Level: Active  Behavioral Response: Well GroomedAlertAnxious  Type of Therapy: Individual Therapy  Treatment Goals addressed: Diagnosis: Specific Phobia  Interventions: CBT and Other: RElaxation training  Summary: Jennifer Jones is a 41 y.o. female who presents with affect congruent w/ report of discomfort w/ medical issues- pt reported she is dealing w/ overactive bladder and going feeling of needing to go to the bathroom every 46min.  Pt reported PCP checked for UTI and is referring to specialist.  Pt reported that she has continued to deal w/ anxiety related to tight spaces and fixated on this when reminded of death.  Pt had a friend's grandmother die and went to pay respects. Pt reports she is focused on the physical body of deceased being uncomfortable- in dark space, in tight space, hot, trapped.  Pt is able to acknowledge connection of thought- feelings and question automatic thoughts- no evidence for- beliefs that body not feeling anything as soul separate.  Pt struggled to identify an alternative thought- but did reflect that prior to grandmother's death prayed for her death as was in so much pain in discomfort. Pt identified that would like to think of body at rest in its safe space instead.  Pt agrees for practice of alternative thought and use of ABC worksheet for this.  Pt participated in breath practice and reports awareness of inital shallow fast breath that she was able to relax.    Suicidal/Homicidal: Nowithout intent/plan  Therapist Response: Assessed pt current functioning per pt report.  Processed w/pt her anxiety related to tight spaces and how connected w/ thoughts of deceased.  Explored w/pt her automatic thoughts and assisted in challenging and identifying an alternative thought. Introduced and taught pt 3 part breath for relaxation.   Plan: Return again in 2 weeks.  Diagnosis: Specific  Phobia    Terryl Molinelli, LPC 03/12/2017

## 2017-03-12 NOTE — Telephone Encounter (Signed)
PT called since she went to [facility] and was prescribe a med for UTI and is not working, she is wondering if you can prescribe something else since the med. Not not working, or to tell her what to do, please follow up

## 2017-03-13 NOTE — Telephone Encounter (Signed)
Will route to PCP 

## 2017-03-14 ENCOUNTER — Ambulatory Visit (HOSPITAL_COMMUNITY)
Admission: RE | Admit: 2017-03-14 | Discharge: 2017-03-14 | Disposition: A | Discharge status: Home / Self Care | Payer: Self-pay | Source: Ambulatory Visit | Attending: Student | Admitting: Student

## 2017-03-14 DIAGNOSIS — R350 Frequency of micturition: Secondary | ICD-10-CM | POA: Insufficient documentation

## 2017-03-14 DIAGNOSIS — R3915 Urgency of urination: Secondary | ICD-10-CM | POA: Insufficient documentation

## 2017-03-14 DIAGNOSIS — Z975 Presence of (intrauterine) contraceptive device: Secondary | ICD-10-CM | POA: Insufficient documentation

## 2017-03-15 MED ORDER — SOLIFENACIN SUCCINATE 5 MG PO TABS: 5.0000 mg | ORAL_TABLET | Freq: Every day | ORAL | 1 refills | Status: DC

## 2017-03-15 NOTE — Telephone Encounter (Signed)
Called back to patient Verified name and DOB She reports dysuria has resolved She now has frequency She urinates 40 times per day She is stuck at home She request medicine for OAB She tried pyridium but it just caused sedation She is awaiting urology appt referral placed 02/27/17 She is aware that there is a wait up to several months with the orange card  Plan  vesicare 5 mg daily sent to [facility]

## 2017-03-18 MED ORDER — SOLIFENACIN SUCCINATE 5 MG PO TABS: 5.0000 mg | ORAL_TABLET | Freq: Every day | ORAL | 3 refills | Status: DC

## 2017-03-18 MED ORDER — OXYBUTYNIN 3.9 MG/24HR TD PTTW: 1.0000 | MEDICATED_PATCH | TRANSDERMAL | 12 refills | Status: DC

## 2017-03-18 NOTE — Telephone Encounter (Signed)
Pt was called and states vesicare is too expensive. Our pharmacy carries the medication but it has to be sent through PASS. Pt was informed that medication may take sometime to receive if put into PASS program. Pt states that she is in a lot of pain and can't take it anymore. Please follow up.

## 2017-03-18 NOTE — Telephone Encounter (Signed)
Patient called states medication is too expensive, patient would like to see if there is an alternative medication for her. Please f/up

## 2017-03-18 NOTE — Telephone Encounter (Signed)
Called patient She has designated party release on file Left VM oxytrol patch sent to [facility]  This is available OTC change patch every 3-4 days  vesicare pills sent to onsite pharmay to start PASS process

## 2017-03-19 MED FILL — VESIcare 5 MG TABS: 5 | 30 days supply | Qty: 30 | Fill #0

## 2017-03-22 ENCOUNTER — Telehealth: Payer: Self-pay | Admitting: Family Medicine

## 2017-03-22 ENCOUNTER — Ambulatory Visit: Payer: Self-pay | Attending: Family Medicine

## 2017-03-22 MED FILL — METFORMIN HCL ER 750 MG TAB: 750 | 30 days supply | Qty: 60 | Fill #0

## 2017-03-22 MED FILL — TRUE METRIX TEST STRIP: 30 days supply | Qty: 100 | Fill #1

## 2017-03-22 MED FILL — SIMVASTATIN 10 MG TABLET: 10 | 30 days supply | Qty: 30 | Fill #0

## 2017-03-22 NOTE — Telephone Encounter (Signed)
PT came to the office requesting the lab result, she want you to call her back...please follow up

## 2017-03-28 ENCOUNTER — Telehealth: Payer: Self-pay | Admitting: Family Medicine

## 2017-03-28 NOTE — Telephone Encounter (Signed)
Pt came in asking for results from u/s. Please call pt. Thank you.

## 2017-03-28 NOTE — Telephone Encounter (Signed)
Please inform patient that ultrasound reveals IUD in placed No fibroid Normal ovaries

## 2017-03-29 ENCOUNTER — Ambulatory Visit: Payer: Self-pay | Attending: Family Medicine

## 2017-04-08 ENCOUNTER — Ambulatory Visit (HOSPITAL_COMMUNITY): Payer: Self-pay | Admitting: Psychiatry

## 2017-04-08 ENCOUNTER — Telehealth: Payer: Self-pay | Admitting: Physical Medicine & Rehabilitation

## 2017-04-08 NOTE — Telephone Encounter (Signed)
Patient is requesting to get another injection in her back. If we can schedule an injection, which injection would you like for [place] to set her up with?  And right now, Dr. Letta Pate only has openings in late July for any appointments.

## 2017-04-08 NOTE — Telephone Encounter (Signed)
She said her right side is hurting real bad.

## 2017-04-09 ENCOUNTER — Ambulatory Visit: Payer: Self-pay | Admitting: Obstetrics & Gynecology

## 2017-04-09 NOTE — Telephone Encounter (Signed)
Schedule for repeat right sacroiliac injection

## 2017-04-15 ENCOUNTER — Ambulatory Visit (INDEPENDENT_AMBULATORY_CARE_PROVIDER_SITE_OTHER): Payer: Medicaid Other | Admitting: Psychology

## 2017-04-15 DIAGNOSIS — F40298 Other specified phobia: Secondary | ICD-10-CM | POA: Diagnosis not present

## 2017-04-15 NOTE — Progress Notes (Signed)
   THERAPIST PROGRESS NOTE  Session Time: 12.35pm-1.25pm  Participation Level: Active  Behavioral Response: Well GroomedAlertAnxious  Type of Therapy: Individual Therapy  Treatment Goals addressed: Diagnosis: anxiety and goal 1.  Interventions: CBT and relaxation strategies  Summary: Jennifer Jones is a 41 y.o. female who presents with report of decreased anxiety.  Pt reported that she has been using breath practice to assist w/ decreasing anxiety- practicing usually at night and that she has been able to go to a funeral w/out anxious and distorted thoughts. Pt reports decreased of 79%in anxiety.   Pt did report that the death of celebrity was difficult recently as enjoyed his show and helped to expose her to being open to new things.  Pt increased awareness of mood as normal and valid- ways to honor.  Pt reported that she still can't imagine going into MRI however. Pt is considering exposure to tight spaces and discusses a fear of being trapped.  Pt was able to identify small steps towards this and use of challenging automatic thoughts and using relaxation. Pt reported more relaxed w/ practice but more difficult for her to focus on then last practice.  Pt discussed difficulty w/ friend and unresolved conflict for her .   Suicidal/Homicidal: Nowithout intent/plan  Therapist Response: Assessed pt current functioning per pt report.  Processed w/pt coping w/ anxiety and steps she is taking and reported improvements.  Introduced to heart math- practiced in session and processed.  Encouraged continued relaxation practices daily.  Discussed steps towards exposure to anxiety provoking situations.  Processed w/pt unresolved conflict and steps towards resolving for self.   Plan: Return again in 1-2 weeks.  Diagnosis: Specific Phobia  YATES,LEANNE, LPC 04/15/2017

## 2017-04-23 ENCOUNTER — Ambulatory Visit (HOSPITAL_COMMUNITY): Payer: Self-pay | Admitting: Psychology

## 2017-04-30 ENCOUNTER — Ambulatory Visit (HOSPITAL_COMMUNITY): Payer: Self-pay | Admitting: Psychology

## 2017-05-06 ENCOUNTER — Encounter: Payer: Medicaid Other | Attending: Physical Medicine & Rehabilitation

## 2017-05-06 ENCOUNTER — Ambulatory Visit (HOSPITAL_BASED_OUTPATIENT_CLINIC_OR_DEPARTMENT_OTHER): Payer: Medicaid Other | Admitting: Physical Medicine & Rehabilitation

## 2017-05-06 ENCOUNTER — Encounter: Payer: Self-pay | Admitting: Physical Medicine & Rehabilitation

## 2017-05-06 VITALS — BP 123/83 | HR 83

## 2017-05-06 DIAGNOSIS — Z809 Family history of malignant neoplasm, unspecified: Secondary | ICD-10-CM | POA: Insufficient documentation

## 2017-05-06 DIAGNOSIS — K760 Fatty (change of) liver, not elsewhere classified: Secondary | ICD-10-CM | POA: Insufficient documentation

## 2017-05-06 DIAGNOSIS — G8929 Other chronic pain: Secondary | ICD-10-CM | POA: Diagnosis not present

## 2017-05-06 DIAGNOSIS — M533 Sacrococcygeal disorders, not elsewhere classified: Secondary | ICD-10-CM | POA: Insufficient documentation

## 2017-05-06 DIAGNOSIS — Z87440 Personal history of urinary (tract) infections: Secondary | ICD-10-CM | POA: Diagnosis not present

## 2017-05-06 DIAGNOSIS — Z8249 Family history of ischemic heart disease and other diseases of the circulatory system: Secondary | ICD-10-CM | POA: Insufficient documentation

## 2017-05-06 DIAGNOSIS — E119 Type 2 diabetes mellitus without complications: Secondary | ICD-10-CM | POA: Diagnosis not present

## 2017-05-06 DIAGNOSIS — M5136 Other intervertebral disc degeneration, lumbar region: Secondary | ICD-10-CM | POA: Diagnosis not present

## 2017-05-06 DIAGNOSIS — E785 Hyperlipidemia, unspecified: Secondary | ICD-10-CM | POA: Insufficient documentation

## 2017-05-06 DIAGNOSIS — Z8619 Personal history of other infectious and parasitic diseases: Secondary | ICD-10-CM | POA: Insufficient documentation

## 2017-05-06 DIAGNOSIS — Z22330 Carrier of Group B streptococcus: Secondary | ICD-10-CM | POA: Insufficient documentation

## 2017-05-06 DIAGNOSIS — E559 Vitamin D deficiency, unspecified: Secondary | ICD-10-CM | POA: Diagnosis not present

## 2017-05-06 NOTE — Progress Notes (Signed)
  PROCEDURE RECORD  Physical Medicine and Rehabilitation   Name: Jennifer Jones DOB:Sep 05, 1976 MRN: [id number]  Date:05/06/2017  Physician: Alysia Penna, MD    Nurse/CMA: Bright CMA  Allergies:  Allergies  Allergen Reactions  . Codeine Itching  . Darvocet [Propoxyphene N-Acetaminophen] Itching  . Dilaudid [Hydromorphone Hcl] Itching  . Percocet [Oxycodone-Acetaminophen] Itching  . Triamcinolone Rash    Cream caused rash    Consent Signed: Yes.    Is patient diabetic? Yes.    CBG today? 41  Pregnant: No. LMP: No LMP recorded. Patient is not currently having periods (Reason: IUD). (age 54-55)  Anticoagulants: no Anti-inflammatory: no Antibiotics: no  Procedure: Right Sacroiliac inj Position: Prone   Start Time: 1124am End Time: 1128am Fluoro Time: 20s  RN/CMA Bright CMA Bright CMA    Time 1100am 1132am    BP 123/83 134/82    Pulse 83 79    Respirations 16 16    O2 Sat 98 97    S/S 6 6    Pain Level 0/10 0/10     D/C home with Ericka Pontiff, patient A & O X 3, D/C instructions reviewed, and sits independently.

## 2017-05-06 NOTE — Progress Notes (Signed)

## 2017-05-06 NOTE — Patient Instructions (Signed)
Sacroiliac injection was performed today. A combination of a naming medicine plus a cortisone medicine was injected. The injection was done under x-ray guidance. This procedure has been performed to help reduce low back and buttocks pain as well as potentially hip pain. The duration of this injection is variable lasting from hours to  Months. It may repeated if needed. 

## 2017-05-23 ENCOUNTER — Ambulatory Visit (INDEPENDENT_AMBULATORY_CARE_PROVIDER_SITE_OTHER): Payer: Medicaid Other | Admitting: Psychology

## 2017-05-23 DIAGNOSIS — F40298 Other specified phobia: Secondary | ICD-10-CM

## 2017-05-23 NOTE — Progress Notes (Signed)
   THERAPIST PROGRESS NOTE  Session Time: 9.05am-9.49am  Participation Level: Active  Behavioral Response: Well GroomedAlertAnxious  Type of Therapy: Individual Therapy  Treatment Goals addressed: Diagnosis: Specific Phobia and goal 1.  Interventions: CBT and Supportive  Summary: Jennifer Jones is a 41 y.o. female who presents with affect congruent w/ mood.  Pt reports she has felt increased anxiety in past month- being more on edge, irritable and snapping at children.  Pt reported that she has had some positives-  Going on vacation 2x.  Pt reported that stressors include sister dx w/ liver issue and worrying about this for self as has fatty liver.  Pt did f/u w/doctor who ran blood work and blood work indicated not an issue- pt reports not peace of mind because wouldn't complete scan.  Pt reported also that her interactions w/ son's are stressor- running around for sports in morning and son's not helping out around the house.  Pt reports had family meeting and they were more helpful for 2 days but then back to complaining.  Pt recognized that valid for her to be feeling frustrated.  Pt also recognized that she tends to not followthrough on limits set and will go ahead and clean up after them as causes anxiety for house to be disorderly.  Pt discussed how she could set some limits and follow through on some things.  Pt reports using relaxation strategies at night- but doesn't feel time in moringing or day. Pt agrees to attempt to make a priority during day. Pt reports she felt was able to go through a long tunnel on her travels and use verbal reframes although anxious for success. Pt plans to discuss medication options w/ Dr. Daron Offer at visit next week.  Suicidal/Homicidal: Nowithout intent/plan  Therapist Response: Assessed pt current functioning per pt report.  Processed w/pt increased anxiety and potential contributing factors.  Reflected to pt success of going through tunnel and continuing to  not avoid but use skills.  Explored w/pt ways of setting limits w/ sons and how to focus and make her self care a priority too.   Plan: Return again in 2 weeks.  Diagnosis: Specific Phobia    YATES,LEANNE, LPC 05/23/2017

## 2017-05-29 ENCOUNTER — Ambulatory Visit (INDEPENDENT_AMBULATORY_CARE_PROVIDER_SITE_OTHER): Payer: Medicaid Other | Admitting: Psychiatry

## 2017-05-29 DIAGNOSIS — F408 Other phobic anxiety disorders: Secondary | ICD-10-CM | POA: Diagnosis not present

## 2017-05-29 DIAGNOSIS — F410 Panic disorder [episodic paroxysmal anxiety] without agoraphobia: Secondary | ICD-10-CM

## 2017-05-29 DIAGNOSIS — Z818 Family history of other mental and behavioral disorders: Secondary | ICD-10-CM

## 2017-05-29 DIAGNOSIS — F411 Generalized anxiety disorder: Secondary | ICD-10-CM | POA: Diagnosis not present

## 2017-05-29 DIAGNOSIS — Z79899 Other long term (current) drug therapy: Secondary | ICD-10-CM

## 2017-05-29 MED ORDER — ESCITALOPRAM OXALATE 10 MG PO TABS: 10.0000 mg | ORAL_TABLET | Freq: Every day | ORAL | 1 refills | Status: DC

## 2017-05-29 NOTE — Progress Notes (Signed)
BH MD/PA/NP OP Progress Note  05/29/2017 2:26 PM Jennifer Jones  MRN:  [id number]  Chief Complaint: anxiety  Subjective: Jennifer Jones reports that her panic is less and she feels like she is better able to handle episodes of severe anxiety in working with [employer]. She reports though that her generalized worry has persisted and she has given thought to medications. She had previously been tried on Zoloft, which she took this at night which disturbed her sleep. I educated the patient that SSRI should only be taken in the morning given the potential for restless leg syndrome. We reviewed the risks and benefits of Lexapro and agreed to start at a mild dose of 10 mg daily. Reviewed the expected benefits and the general trajectory of continuing Lexapro for approximately 6 months before deciding whether to discontinue. She agreed to follow-up in 3 months to see if the dose was appropriate. She agrees to follow up sooner if needed. She reports that things at home are otherwise good, her boys are energetic teenagers so that can be quite tiring, but otherwise she feels that things are going well for her. Denies any substance abuse or safety issues.  Visit Diagnosis:    ICD-10-CM   1. Generalized anxiety disorder F41.1 escitalopram (LEXAPRO) 10 MG tablet    Past Psychiatric History: See intake H&P for full details. Reviewed, with no updates at this time.   Past Medical History:  Past Medical History:  Diagnosis Date  . Bulging lumbar disc   . Diabetes mellitus, type II ([place])   . Fatty liver   . Fibrocystic breast 03/2007  . 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 H/O varicella   . H/O: menorrhagia 2011  . Hx gestational diabetes 2002 and 2005  . Hx of candidal vulvovaginitis   . Hx: UTI (urinary tract infection)   . Hyperlipemia   . Sciatica   . Vitamin D deficiency     Past Surgical History:  Procedure Laterality Date  . CESAREAN SECTION  98.02, 05    x 3    Family Psychiatric History:  See intake H&P for full details. Reviewed, with no updates at this time.   Family History:  Family History  Problem Relation Age of Onset  . Hypertension Mother   . Heart disease Mother   . Hyperlipidemia Mother   . Depression Father   . Hyperlipidemia Sister   . Depression Sister   . Hyperlipidemia Brother   . Anxiety disorder Son   . Hyperlipidemia Sister   . Depression Sister   . Hypertension Maternal Grandfather   . Cancer Maternal Grandmother     Social History:  Social History   Social History  . Marital status: Legally Separated    Spouse name: N/A  . Number of children: 3  . Years of education: bachelor    Occupational History  . 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 Smoking status: Never Smoker  . Smokeless tobacco: Never Used  . Alcohol use No  . Drug use: No  . Sexual activity: Yes    Birth control/ protection: IUD     Comment: MIRENA placed in 2012    Other Topics Concern  . Not on file   Social History Narrative   Lives with 3 sons, husband.   Mom visits from [place].     Allergies:  Allergies  Allergen Reactions  . Codeine Itching  . Darvocet [Propoxyphene N-Acetaminophen] Itching  .  Dilaudid [Hydromorphone Hcl] Itching  . Percocet [Oxycodone-Acetaminophen] Itching  . Triamcinolone Rash    Cream caused rash    Metabolic Disorder Labs: Lab Results  Component Value Date   HGBA1C 6.0 12/24/2016   No results found for: PROLACTIN Lab Results  Component Value Date   CHOL 155 12/24/2016   TRIG 107 12/24/2016   HDL 36 (L) 12/24/2016   CHOLHDL 4.3 12/24/2016   VLDL 21 12/24/2016   LDLCALC 98 12/24/2016   LDLCALC 100 12/29/2015     Current Medications: Current Outpatient Prescriptions  Medication Sig Dispense Refill  . Blood Glucose Monitoring Suppl (TRUE METRIX METER) W/DEVICE KIT Used as instructed 1 kit 0  . calcium carbonate (TUMS - DOSED IN MG ELEMENTAL CALCIUM) 500 MG chewable tablet Chew 2  tablets by mouth daily as needed for indigestion or heartburn.    . cholecalciferol (VITAMIN D) 1000 UNITS tablet Take 1,000 Units by mouth daily.     Marland Kitchen escitalopram (LEXAPRO) 10 MG tablet Take 1 tablet (10 mg total) by mouth daily. 90 tablet 1  . glucose blood (TRUE METRIX BLOOD GLUCOSE TEST) test strip Use as instructed 100 each 12  . ibuprofen (ADVIL,MOTRIN) 200 MG tablet Take 400 mg by mouth every 6 (six) hours as needed for mild pain.    Marland Kitchen levonorgestrel (MIRENA) 20 MCG/24HR IUD 1 each by Intrauterine route once.    . metFORMIN (GLUCOPHAGE) 1000 MG tablet Take 1 tablet (1,000 mg total) by mouth 2 (two) times daily with a meal. 60 tablet 11  . naproxen (NAPROSYN) 500 MG tablet Take 500 mg by mouth as needed.    . simvastatin (ZOCOR) 10 MG tablet Take 1 tablet (10 mg total) by mouth at bedtime. 30 tablet 11  . solifenacin (VESICARE) 5 MG tablet Take 1 tablet (5 mg total) by mouth daily. For PASS 90 tablet 3  . TRUEPLUS LANCETS 28G MISC Use as directed 100 each 12   No current facility-administered medications for this visit.     Neurologic: Headache: Negative Seizure: Negative Paresthesias: Negative  Musculoskeletal: Strength & Muscle Tone: within normal limits Gait & Station: normal Patient leans: N/A  Psychiatric Specialty Exam: ROS  There were no vitals taken for this visit.There is no height or weight on file to calculate BMI.  General Appearance: Casual and Fairly Groomed  Eye Contact:  Good  Speech:  Clear and Coherent  Volume:  Normal  Mood:  Anxious and Euthymic  Affect:  Appropriate and Congruent  Thought Process:  Goal Directed  Orientation:  Full (Time, Place, and Person)  Thought Content: Logical   Suicidal Thoughts:  No  Homicidal Thoughts:  No  Memory:  Immediate;   Good  Judgement:  Good  Insight:  Good  Psychomotor Activity:  Normal  Concentration:  Concentration: Good  Recall:  Good  Fund of Knowledge: Good  Language: Good  Akathisia:  Negative   Handed:  Right  AIMS (if indicated):  0  Assets:  Communication Skills Desire for Improvement Financial Resources/Insurance Housing Intimacy Leisure Time Physical Health Resilience Social Support Talents/Skills Transportation  ADL's:  Intact  Cognition: WNL  Sleep:  7-9 hours    Treatment Plan Summary: Jennifer Jones is a 41 year old female with a psychiatric history consistent with generalized anxiety disorder with episodes of panic. She had developed a specific phobia related to tight spaces, and seems to be improving in her work with Joslyn Devon, with regard to this issue. She continues to struggle with generalized worry, and is agreeable to  initiate low-dose SSRI. We reviewed the risks and benefits of Lexapro and agreed to follow up in 3 months.  1. Generalized anxiety disorder    Initiate Lexapro 10 mg daily; should be taken in the morning Follow-up in 3 months Continue individual therapy with Viviano Simas, MD 05/29/2017, 2:26 PM

## 2017-06-03 ENCOUNTER — Encounter: Payer: Medicaid Other | Attending: Physical Medicine & Rehabilitation

## 2017-06-03 ENCOUNTER — Ambulatory Visit (HOSPITAL_BASED_OUTPATIENT_CLINIC_OR_DEPARTMENT_OTHER): Payer: Medicaid Other | Admitting: Physical Medicine & Rehabilitation

## 2017-06-03 ENCOUNTER — Encounter: Payer: Self-pay | Admitting: Physical Medicine & Rehabilitation

## 2017-06-03 VITALS — BP 118/81 | HR 79

## 2017-06-03 DIAGNOSIS — G8929 Other chronic pain: Secondary | ICD-10-CM | POA: Diagnosis not present

## 2017-06-03 DIAGNOSIS — M533 Sacrococcygeal disorders, not elsewhere classified: Secondary | ICD-10-CM | POA: Insufficient documentation

## 2017-06-03 DIAGNOSIS — Z8619 Personal history of other infectious and parasitic diseases: Secondary | ICD-10-CM | POA: Insufficient documentation

## 2017-06-03 DIAGNOSIS — K760 Fatty (change of) liver, not elsewhere classified: Secondary | ICD-10-CM | POA: Insufficient documentation

## 2017-06-03 DIAGNOSIS — Z87440 Personal history of urinary (tract) infections: Secondary | ICD-10-CM | POA: Insufficient documentation

## 2017-06-03 DIAGNOSIS — E119 Type 2 diabetes mellitus without complications: Secondary | ICD-10-CM | POA: Insufficient documentation

## 2017-06-03 DIAGNOSIS — E785 Hyperlipidemia, unspecified: Secondary | ICD-10-CM | POA: Insufficient documentation

## 2017-06-03 DIAGNOSIS — E559 Vitamin D deficiency, unspecified: Secondary | ICD-10-CM | POA: Diagnosis not present

## 2017-06-03 DIAGNOSIS — M5136 Other intervertebral disc degeneration, lumbar region: Secondary | ICD-10-CM | POA: Diagnosis not present

## 2017-06-03 DIAGNOSIS — Z8249 Family history of ischemic heart disease and other diseases of the circulatory system: Secondary | ICD-10-CM | POA: Insufficient documentation

## 2017-06-03 DIAGNOSIS — Z809 Family history of malignant neoplasm, unspecified: Secondary | ICD-10-CM | POA: Insufficient documentation

## 2017-06-03 DIAGNOSIS — Z22330 Carrier of Group B streptococcus: Secondary | ICD-10-CM | POA: Insufficient documentation

## 2017-06-03 MED ORDER — TRAMADOL HCL 50 MG PO TABS: 50.0000 mg | ORAL_TABLET | Freq: Two times a day (BID) | ORAL | 0 refills | Status: DC

## 2017-06-03 MED ORDER — NAPROXEN 500 MG PO TABS: 500.0000 mg | ORAL_TABLET | Freq: Two times a day (BID) | ORAL | 1 refills | Status: DC

## 2017-06-03 NOTE — Progress Notes (Signed)
Subjective:    Patient ID: Jennifer Jones, female    DOB: 1975/10/25, 41 y.o.   MRN: [id number]  HPI Left sided low back and buttock , started about 2 weeksAgo She had right sacroiliac pain for one day last week, but otherwise this has improved , had Right Sacroiliac injection 05/06/17 Left sacroiliac injection 02/26/2017 No falls or injury  No bowel or bladder dysfunction. No numbness or tingling or weakness in the lower extremities  Seen by Psychiatry started on lexapro but pt hasn't started taking yet. Pain Inventory Average Pain 9 Pain Right Now 8 My pain is dull and aching  In the last 24 hours, has pain interfered with the following? General activity 10 Relation with others 10 Enjoyment of life n/a What TIME of day is your pain at its worst? all Sleep (in general) Poor  Pain is worse with: walking, bending, sitting and standing Pain improves with: injections Relief from Meds: 2  Mobility walk without assistance  Function not employed: date last employed .  Neuro/Psych trouble walking  Prior Studies Any changes since last visit?  no  Physicians involved in your care Any changes since last visit?  no   Family History  Problem Relation Age of Onset  . Hypertension Mother   . Heart disease Mother   . Hyperlipidemia Mother   . Depression Father   . Hyperlipidemia Sister   . Depression Sister   . Hyperlipidemia Brother   . Anxiety disorder Son   . Hyperlipidemia Sister   . Depression Sister   . Hypertension Maternal Grandfather   . Cancer Maternal Grandmother    Social History   Social History  . Marital status: Legally Separated    Spouse name: N/A  . Number of children: 3  . Years of education: bachelor    Occupational History  . 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 Smoking status: Never Smoker  . Smokeless tobacco: Never Used  . Alcohol use No  . Drug use: No  . Sexual activity: Yes    Birth control/  protection: IUD     Comment: MIRENA placed in 2012    Other Topics Concern  . None   Social History Narrative   Lives with 3 sons, husband.   Mom visits from [place].    Past Surgical History:  Procedure Laterality Date  . CESAREAN SECTION  98.02, 05    x 3   Past Medical History:  Diagnosis Date  . Bulging lumbar disc   . Diabetes mellitus, type II ([place])   . Fatty liver   . Fibrocystic breast 03/2007  . 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 H/O varicella   . H/O: menorrhagia 2011  . Hx gestational diabetes 2002 and 2005  . Hx of candidal vulvovaginitis   . Hx: UTI (urinary tract infection)   . Hyperlipemia   . Sciatica   . Vitamin D deficiency    BP 118/81   Pulse 79   SpO2 97%   Opioid Risk Score:   Fall Risk Score:  `1  Depression screen PHQ 2/9  Depression screen [facility] 2/9 06/03/2017 01/29/2017 12/24/2016 08/31/2016 04/19/2016 12/05/2015 08/23/2015  Decreased Interest 0 0 0 0 0 - 0  Down, Depressed, Hopeless 0 0 0 0 0 0 0  PHQ - 2 Score 0 0 0 0 0 0 0  Altered sleeping - 0 0 0 - 0 -  Tired, decreased energy -  1 0 0 - 1 -  Change in appetite - 1 0 0 - 1 -  Feeling bad or failure about yourself  - 0 0 0 - 0 -  Trouble concentrating - 0 0 0 - 0 -  Moving slowly or fidgety/restless - 0 0 0 - 0 -  Suicidal thoughts - 0 0 0 - 0 -  PHQ-9 Score - 2 0 0 - 2 -    Review of Systems  Constitutional: Negative.   HENT: Negative.   Eyes: Negative.   Respiratory: Negative.   Cardiovascular: Negative.   Gastrointestinal: Negative.   Endocrine: Negative.   Genitourinary: Negative.   Musculoskeletal: Positive for back pain and gait problem.  Skin: Negative.   Allergic/Immunologic: Negative.   Hematological: Negative.   Psychiatric/Behavioral: Negative.   All other systems reviewed and are negative.      Objective:   Physical Exam  Constitutional: She is oriented to person, place, and time. She appears well-developed and well-nourished. No distress.  HENT:  Head:  Normocephalic and atraumatic.  Eyes: Pupils are equal, round, and reactive to light. Conjunctivae and EOM are normal.  Neck: Normal range of motion.  Neurological: She is alert and oriented to person, place, and time. Gait abnormal. Coordination normal.  Reflex Scores:      Patellar reflexes are 2+ on the right side and 2+ on the left side.      Achilles reflexes are 2+ on the right side and 2+ on the left side. Antalgic gait favors left lower limb  Motor strength is 5/5 bilateral hip flexor, knee extensors, ankle dorsal flexor  Negative straight leg raising test  Skin: She is not diaphoretic.  Psychiatric: She has a normal mood and affect. Her behavior is normal. Judgment and thought content normal.  Nursing note and vitals reviewed.         Assessment & Plan:

## 2017-06-03 NOTE — Patient Instructions (Signed)
Sacroiliac Joint Dysfunction Sacroiliac joint dysfunction is a condition that causes inflammation on one or both sides of the sacroiliac (SI) joint. The SI joint connects the lower part of the spine (sacrum) with the two upper portions of the pelvis (ilium). This condition causes deep aching or burning pain in the low back. In some cases, the pain may also spread into one or both buttocks or hips or spread down the legs. What are the causes? This condition may be caused by:  Pregnancy. During pregnancy, extra stress is put on the SI joints because the pelvis widens.  Injury, such as: ? Car accidents. ? Sport-related injuries. ? Work-related injuries.  Having one leg that is shorter than the other.  Conditions that affect the joints, such as: ? Rheumatoid arthritis. ? Gout. ? Psoriatic arthritis. ? Joint infection (septic arthritis).  Sometimes, the cause of SI joint dysfunction is not known. What are the signs or symptoms? Symptoms of this condition include:  Aching or burning pain in the lower back. The pain may also spread to other areas, such as: ? Buttocks. ? Groin. ? Thighs and legs.  Muscle spasms in or around the painful areas.  Increased pain when standing, walking, running, stair climbing, bending, or lifting.  How is this diagnosed? Your health care provider will do a physical exam and take your medical history. During the exam, the health care provider may move one or both of your legs to different positions to check for pain. Various tests may be done to help verify the diagnosis, including:  Imaging tests to look for other causes of pain. These may include: ? MRI. ? CT scan. ? Bone scan.  Diagnostic injection. A numbing medicine is injected into the SI joint using a needle. If the pain is temporarily improved or stopped after the injection, this can indicate that SI joint dysfunction is the problem.  How is this treated? Treatment may vary depending on the  cause and severity of your condition. Treatment options may include:  Applying ice or heat to the lower back area. This can help to reduce pain and muscle spasms.  Medicines to relieve pain or inflammation or to relax the muscles.  Wearing a back brace (sacroiliac brace) to help support the joint while your back is healing.  Physical therapy to increase muscle strength around the joint and flexibility at the joint. This may also involve learning proper body positions and ways of moving to relieve stress on the joint.  Direct manipulation of the SI joint.  Injections of steroid medicine into the joint in order to reduce pain and swelling.  Radiofrequency ablation to burn away nerves that are carrying pain messages from the joint.  Use of a device that provides electrical stimulation in order to reduce pain at the joint.  Surgery to put in screws and plates that limit or prevent joint motion. This is rare.  Follow these instructions at home:  Rest as needed. Limit your activities as directed by your health care provider.  Take medicines only as directed by your health care provider.  If directed, apply ice to the affected area: ? Put ice in a plastic bag. ? Place a towel between your skin and the bag. ? Leave the ice on for 20 minutes, 2-3 times per day.  Use a heating pad or a moist heat pack as directed by your health care provider.  Exercise as directed by your health care provider or physical therapist.  Keep all follow-up visits   as directed by your health care provider. This is important. Contact a health care provider if:  Your pain is not controlled with medicine.  You have a fever.  You have increasingly severe pain. Get help right away if:  You have weakness, numbness, or tingling in your legs or feet.  You lose control of your bladder or bowel. This information is not intended to replace advice given to you by your health care provider. Make sure you discuss  any questions you have with your health care provider. Document Released: 12/28/2008 Document Revised: 03/08/2016 Document Reviewed: 06/08/2014 Elsevier Interactive Patient Education  2018 [employer]vier [employer]  

## 2017-06-13 ENCOUNTER — Ambulatory Visit: Payer: Medicaid Other | Admitting: Physical Therapy

## 2017-06-13 ENCOUNTER — Ambulatory Visit: Payer: Medicaid Other | Attending: Physical Medicine & Rehabilitation | Admitting: Physical Therapy

## 2017-06-13 DIAGNOSIS — R293 Abnormal posture: Secondary | ICD-10-CM | POA: Diagnosis present

## 2017-06-13 DIAGNOSIS — M6281 Muscle weakness (generalized): Secondary | ICD-10-CM

## 2017-06-13 DIAGNOSIS — M5440 Lumbago with sciatica, unspecified side: Secondary | ICD-10-CM | POA: Diagnosis not present

## 2017-06-13 DIAGNOSIS — R262 Difficulty in walking, not elsewhere classified: Secondary | ICD-10-CM | POA: Insufficient documentation

## 2017-06-13 DIAGNOSIS — G8929 Other chronic pain: Secondary | ICD-10-CM | POA: Diagnosis present

## 2017-06-13 NOTE — Therapy (Signed)
[facility] [address]  [place], [place], [address] Phone: [phone]   Fax:  [phone]  Physical Therapy Evaluation  Patient Details  Name: Jennifer Jones MRN: [id number] Date of Birth: 09-Dec-1975 Referring Provider: Dr. Alysia Penna  Encounter Date: 06/13/2017      PT End of Session - 06/13/17 1738    Visit Number 1   Authorization Type Adult Medicaid: 1 Eval + 1 treat   PT Start Time 0931   PT Stop Time 1030   PT Time Calculation (min) 59 min   Activity Tolerance Patient tolerated treatment well   Behavior During Therapy [facility] for tasks assessed/performed      Past Medical History:  Diagnosis Date  . Bulging lumbar disc   . Diabetes mellitus, type II ([place])   . Fatty liver   . Fibrocystic breast 03/2007  . 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 H/O varicella   . H/O: menorrhagia 2011  . Hx gestational diabetes 2002 and 2005  . Hx of candidal vulvovaginitis   . Hx: UTI (urinary tract infection)   . Hyperlipemia   . Sciatica   . Vitamin D deficiency     Past Surgical History:  Procedure Laterality Date  . CESAREAN SECTION  98.02, 05    x 3    There were no vitals filed for this visit.       Subjective Assessment - 06/13/17 0931    Subjective Patient reporting a several year history of back pain going into legs. Saw MD - dx with SI dysfunction. Feels like symptoms are starting to get better - past couple weeks pain at 9/10. Pain does go into L leg into calf. Has had injection SI - relief lasted approx 1 month   Diagnostic tests MRI - herniated disc per patient   Patient Stated Goals improve pain   Currently in Pain? Yes   Pain Score 4    Pain Location Buttocks   Pain Orientation Left   Pain Descriptors / Indicators Aching;Sharp;Tightness   Pain Type Chronic pain   Pain Onset More than a month ago   Pain Frequency Intermittent   Aggravating Factors  sitting, changing positions   Pain  Relieving Factors pain medication            [facility] PT Assessment - 06/13/17 0935      Assessment   Medical Diagnosis B SI joint dysfunction   Referring Provider Dr. Alysia Penna   Next MD Visit 06/24/17  scheduled for injections   Prior Therapy no     Precautions   Precautions None     Restrictions   Weight Bearing Restrictions No     Balance Screen   Has the patient fallen in the past 6 months No   Has the patient had a decrease in activity level because of a fear of falling?  No   Is the patient reluctant to leave their home because of a fear of falling?  No     Home Environment   Living Environment Private residence   Type of [place] Access Level entry   Home Layout One level   [place] None     Prior Function   Level of Independence Independent   Vocation Unemployed   Vocation Requirements children at home     Cognition   Overall Cognitive Status Within Functional Limits for tasks assessed     Sensation   Light Touch Appears  Intact     Coordination   Gross Motor Movements are Fluid and Coordinated Yes     ROM / Strength   AROM / PROM / Strength AROM;Strength     AROM   AROM Assessment Site Lumbar   Lumbar Flexion fingertip to mid shin  painful returning to neutral   Lumbar Extension 50% limited  pain down leg   Lumbar - Right Side Bend fingertip to above joint line  pain in Left low back   Lumbar - Left Side Bend fingertip to joint line  no pain   Lumbar - Right Rotation 50% limited   Lumbar - Left Rotation 50% limited     Strength   Overall Strength Comments R LE grossly 4/5; L LE grossly 4-/5     Flexibility   Soft Tissue Assessment /Muscle Length yes   Hamstrings B: moderate tightness     Palpation   Palpation comment tender to L glute/piriformis complex     Special Tests    Special Tests Lumbar   Lumbar Tests Straight Leg Raise     Straight Leg Raise   Findings Negative   Comment bilateral             Objective measurements completed on examination: See above findings.          [place] Adult PT Treatment/Exercise - 06/13/17 0001      Modalities   Modalities Electrical Stimulation;Moist Heat     Moist Heat Therapy   Number Minutes Moist Heat 20 Minutes   Moist Heat Location Lumbar Spine     Electrical Stimulation   Electrical Stimulation Location lumbar to SI   Electrical Stimulation Action IFC   Electrical Stimulation Parameters to tolerance   Electrical Stimulation Goals Pain                PT Education - 06/13/17 1737    Education provided Yes   Education Details exam findings, POC, HEP   Person(s) Educated Patient   Methods Explanation;Demonstration;Handout   Comprehension Verbalized understanding;Returned demonstration             PT Long Term Goals - 06/13/17 1746      PT LONG TERM GOAL #1   Title pt to be independent with HEP   Status New   Target Date 07/11/17     PT LONG TERM GOAL #2   Title patient to improve pain by 50% for greater than 2 weeks   Status New   Target Date 07/11/17     PT LONG TERM GOAL #3   Title patient to verbalize other PT avenues available to her ([facility])   Status New   Target Date 07/11/17                Plan - 06/13/17 1741    Clinical Impression Statement Patient is a 41 y/o female presenting to [facility] today for evaluation of primary reports of low back (SI joint) pain that has persisted for quite some time. Patient reporting pain with all activities with AROm limitedat lumbar spine as well as reduced strength at proximal hips. Patient also TTP over L glute/piriformis complex. Paitnet today given initial HEP for stretching and strengthening. WIll plan to follow up with patient in 1-2 weeks to assess tolerance to HEP as well as progress and refer patient to [facility] pro bono clinic.    Clinical Presentation Stable   Clinical Decision Making Low   Rehab Potential Good   PT Frequency Other (comment)  Adult Medicaid: 1 Eval. 1 Treatment   PT Treatment/Interventions ADLs/Self Care Home Management;Cryotherapy;Electrical Stimulation;Moist Heat;Traction;Ultrasound;Neuromuscular re-education;Balance training;Therapeutic exercise;Therapeutic activities;Functional mobility training;Gait training;Patient/family education;Manual techniques;Passive range of motion;Vasopneumatic Device;Taping;Dry needling   Consulted and Agree with Plan of Care Patient      Patient will benefit from skilled therapeutic intervention in order to improve the following deficits and impairments:  Abnormal gait, Decreased activity tolerance, Decreased range of motion, Decreased mobility, Decreased strength, Difficulty walking, Pain  Visit Diagnosis: Chronic low back pain with sciatica, sciatica laterality unspecified, unspecified back pain laterality - Plan: PT plan of care cert/re-cert  Abnormal posture - Plan: PT plan of care cert/re-cert  Muscle weakness (generalized) - Plan: PT plan of care cert/re-cert  Difficulty in walking, not elsewhere classified - Plan: PT plan of care cert/re-cert     Problem List Patient Active Problem List   Diagnosis Date Noted  . Chronic right sacroiliac pain 05/06/2017  . 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 Chronic left sacroiliac joint pain 01/31/2017  . Lumbar degenerative disc disease 01/31/2017  . Urinary frequency 01/29/2017  . Estrogen excess 01/29/2017  . Hot flashes 12/24/2016  . Claustrophobia 12/24/2016  . Vagina itching 08/31/2016  . IUD (intrauterine device) in place 04/22/2016  . Left-sided low back pain without sciatica 04/19/2016  . External hemorrhoids 12/05/2015  . Joint pain 07/08/2015  . Right elbow pain 12/03/2014  . Prediabetes 06/04/2014  . Hair loss 06/04/2014  . Fatty liver disease, nonalcoholic 23/53/6144  . Vitamin D deficiency 06/04/2014  . Overweight (BMI 25.0-29.9) 06/04/2014  . Right knee pain 06/04/2014  . Tension headache 06/04/2014      Lanney Gins, PT, DPT 06/13/17 5:50 PM   [facility] [address]  [place], [place], [address] Phone: [phone]   Fax:  [phone]  Name: Jennifer Jones MRN: [id number] Date of Birth: 1975/11/26

## 2017-06-13 NOTE — Patient Instructions (Signed)
Hamstring Step 2   Left foot relaxed, knee straight, other leg bent, foot flat. Raise straight leg further upward to maximal range. Hold _30-60__ seconds. Relax leg completely down. Repeat _3__ times.   Piriformis Stretch   Lying on back, pull right knee toward opposite shoulder. Hold _30-60___ seconds. Repeat __3__ times.   Bridge   Lie back, legs bent. Inhale, pressing hips up. Keeping ribs in, lengthen lower back. Exhale, rolling down along spine from top. Repeat _10-15___ times. Do __2__ sessions per day.  Supine Knee to Chest   Lie on back. Gently pull one knee toward chest. Hold _10-15__ seconds.  Repeat _5-10__ times per session.  Lumbar Rotation (Non-Weight Bearing)   Feet on floor, slowly rock knees from side to side in small, pain-free range of motion. Allow lower back to rotate slightly. Repeat _10-15___ times per set.   Strengthening: Straight Leg Raise (Phase 1)   Tighten muscles on front of right thigh, then lift leg _6-8___ inches from surface, keeping knee locked.  Repeat _10-15___ times per set. Do ___2_ sets per session.   ABDUCTION: Side-Lying (Active)   Lie on left side, top leg straight. Raise top leg as far as possible. Use _0__ lbs. Complete _2__ sets of _10-15__ repetitions.

## 2017-06-24 ENCOUNTER — Ambulatory Visit (HOSPITAL_BASED_OUTPATIENT_CLINIC_OR_DEPARTMENT_OTHER): Payer: Medicaid Other | Admitting: Physical Medicine & Rehabilitation

## 2017-06-24 ENCOUNTER — Encounter: Payer: Self-pay | Admitting: Physical Medicine & Rehabilitation

## 2017-06-24 ENCOUNTER — Encounter: Payer: Medicaid Other | Attending: Physical Medicine & Rehabilitation

## 2017-06-24 VITALS — BP 127/85 | HR 74

## 2017-06-24 DIAGNOSIS — M5136 Other intervertebral disc degeneration, lumbar region: Secondary | ICD-10-CM | POA: Insufficient documentation

## 2017-06-24 DIAGNOSIS — E119 Type 2 diabetes mellitus without complications: Secondary | ICD-10-CM | POA: Insufficient documentation

## 2017-06-24 DIAGNOSIS — Z22330 Carrier of Group B streptococcus: Secondary | ICD-10-CM | POA: Diagnosis not present

## 2017-06-24 DIAGNOSIS — Z809 Family history of malignant neoplasm, unspecified: Secondary | ICD-10-CM | POA: Insufficient documentation

## 2017-06-24 DIAGNOSIS — M533 Sacrococcygeal disorders, not elsewhere classified: Secondary | ICD-10-CM

## 2017-06-24 DIAGNOSIS — Z8619 Personal history of other infectious and parasitic diseases: Secondary | ICD-10-CM | POA: Diagnosis not present

## 2017-06-24 DIAGNOSIS — G8929 Other chronic pain: Secondary | ICD-10-CM | POA: Insufficient documentation

## 2017-06-24 DIAGNOSIS — E559 Vitamin D deficiency, unspecified: Secondary | ICD-10-CM | POA: Insufficient documentation

## 2017-06-24 DIAGNOSIS — E785 Hyperlipidemia, unspecified: Secondary | ICD-10-CM | POA: Diagnosis not present

## 2017-06-24 DIAGNOSIS — K760 Fatty (change of) liver, not elsewhere classified: Secondary | ICD-10-CM | POA: Insufficient documentation

## 2017-06-24 DIAGNOSIS — Z87440 Personal history of urinary (tract) infections: Secondary | ICD-10-CM | POA: Diagnosis not present

## 2017-06-24 DIAGNOSIS — Z8249 Family history of ischemic heart disease and other diseases of the circulatory system: Secondary | ICD-10-CM | POA: Diagnosis not present

## 2017-06-24 NOTE — Patient Instructions (Signed)
Sacroiliac injection was performed today. A combination of a naming medicine plus a cortisone medicine was injected. The injection was done under x-ray guidance. This procedure has been performed to help reduce low back and buttocks pain as well as potentially hip pain. The duration of this injection is variable lasting from hours to  Months. It may repeated if needed. 

## 2017-06-24 NOTE — Progress Notes (Signed)

## 2017-06-24 NOTE — Progress Notes (Signed)
  PROCEDURE RECORD [facility]   Name: Jennifer Jones DOB:03/12/1976 MRN: [id number]  Date:06/24/2017  Physician: Alysia Penna, MD    Nurse/CMA: Aniko Finnigan, CMA   Allergies:  Allergies  Allergen Reactions  . Codeine Itching  . Darvocet [Propoxyphene N-Acetaminophen] Itching  . Dilaudid [Hydromorphone Hcl] Itching  . Percocet [Oxycodone-Acetaminophen] Itching  . Triamcinolone Rash    Cream caused rash    Consent Signed: Yes.    Is patient diabetic? Yes.    CBG today? 136 (06/23/2017)  Pregnant: No. LMP: No LMP recorded. Patient is not currently having periods (Reason: IUD). (age 61-55)  Anticoagulants: no Anti-inflammatory: no Antibiotics: no  Procedure: bilateral sacroiliac steroid injection  Position: Prone Start Time: 2:47pm      End Time: 2:55pm  Fluoro Time: 30  RN/CMA Jayse Hodkinson, CMA Jurline Folger, CMA    Time 2:30 pm 2:59pm    BP 127/85 119/77    Pulse 77 74    Respirations 14 14    O2 Sat 97 96    S/S 6 6    Pain Level 5/10 0/10     D/C home with son patient A & O X 3, D/C instructions reviewed, and sits independently.

## 2017-06-26 ENCOUNTER — Ambulatory Visit: Payer: Medicaid Other | Admitting: Physical Therapy

## 2017-07-09 ENCOUNTER — Ambulatory Visit: Payer: Medicaid Other | Admitting: Physical Therapy

## 2017-07-11 ENCOUNTER — Ambulatory Visit: Payer: Medicaid Other | Attending: Physical Medicine & Rehabilitation | Admitting: Physical Therapy

## 2017-07-11 DIAGNOSIS — G8929 Other chronic pain: Secondary | ICD-10-CM | POA: Insufficient documentation

## 2017-07-11 DIAGNOSIS — R293 Abnormal posture: Secondary | ICD-10-CM | POA: Diagnosis present

## 2017-07-11 DIAGNOSIS — M6281 Muscle weakness (generalized): Secondary | ICD-10-CM | POA: Insufficient documentation

## 2017-07-11 DIAGNOSIS — M5440 Lumbago with sciatica, unspecified side: Secondary | ICD-10-CM | POA: Diagnosis not present

## 2017-07-11 DIAGNOSIS — R262 Difficulty in walking, not elsewhere classified: Secondary | ICD-10-CM | POA: Diagnosis present

## 2017-07-11 NOTE — Patient Instructions (Signed)
Pelvic Tilt: Posterior - Legs Bent (Supine)   Tighten stomach and flatten back by rolling pelvis down. Hold __5-10__ seconds. Relax. Repeat __15__ times per set.  ** Can progress by adding in alternating marches**  External Rotation: Hip - Knees Apart (Hook-Lying)   Lie with hips and knees bent, band tied just above knees. Pull 1 knee apart at a time.  Repeat _15__ times.   Bilateral Isometric Hip Flexion   Tighten stomach and raise both knees to outstretched arms. Push gently, keeping arms straight, trunk rigid. Hold __5-10__ seconds. Repeat __15__ times per set.   Mini Squat: Double Leg   With feet shoulder width apart, reach forward for balance and do a mini squat. Keep knees in line with second toe. Knees do not go past toes. Repeat _15__ times per set.   Forward Lunge   Standing with feet shoulder width apart and stomach tight, step forward with left leg. Repeat __15__ times per set.

## 2017-07-11 NOTE — Therapy (Signed)
[facility] [address]  [place], [place], [address] Phone: [phone]   Fax:  [phone]  Physical Therapy Treatment  Patient Details  Name: Jennifer Jones MRN: [id number] Date of Birth: 09-23-1976 Referring Provider: Dr. Alysia Penna  Encounter Date: 07/11/2017      PT End of Session - 07/11/17 1020    Visit Number 2   Authorization Type Adult Medicaid: 1 Eval + 1 treat   PT Start Time 1018   PT Stop Time 1113   PT Time Calculation (min) 55 min   Activity Tolerance Patient tolerated treatment well   Behavior During Therapy [facility] for tasks assessed/performed      Past Medical History:  Diagnosis Date  . Bulging lumbar disc   . Diabetes mellitus, type II ([place])   . Fatty liver   . Fibrocystic breast 03/2007  . 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 H/O varicella   . H/O: menorrhagia 2011  . Hx gestational diabetes 2002 and 2005  . Hx of candidal vulvovaginitis   . Hx: UTI (urinary tract infection)   . Hyperlipemia   . Sciatica   . Vitamin D deficiency     Past Surgical History:  Procedure Laterality Date  . CESAREAN SECTION  98.02, 05    x 3    There were no vitals filed for this visit.      Subjective Assessment - 07/11/17 1019    Subjective Only having some discomfort - no longer having any real pain   Diagnostic tests MRI - herniated disc per patient   Patient Stated Goals improve pain   Currently in Pain? Yes   Pain Score 2    Pain Location Back   Pain Orientation Lower   Pain Descriptors / Indicators Sore   Pain Type Chronic pain                         [facility] Adult PT Treatment/Exercise - 07/11/17 1022      Exercises   Exercises Lumbar     Lumbar Exercises: Aerobic   Stationary Bike NuStep L5 x 6 min     Lumbar Exercises: Standing   Functional Squats 15 reps   Functional Squats Limitations TRX   Forward Lunge 15 reps   Forward Lunge Limitations B: TRX      Lumbar Exercises: Supine   Ab Set 15 reps;5 seconds   Clam 15 reps   Clam Limitations + ab set - 15 each side   Bent Knee Raise 15 reps   Bent Knee Raise Limitations alternating + ab set   Bridge 15 reps;5 seconds   Bridge Limitations green tband at knees   Isometric Hip Flexion 15 reps;5 seconds   Isometric Hip Flexion Limitations feet resting on peanut ball     Modalities   Modalities Electrical Stimulation;Moist Heat     Moist Heat Therapy   Number Minutes Moist Heat 15 Minutes   Moist Heat Location Lumbar Spine     Electrical Stimulation   Electrical Stimulation Location lumbar to SI   Electrical Stimulation Action IFC   Electrical Stimulation Parameters to tolerance   Electrical Stimulation Goals Pain                     PT Long Term Goals - 07/11/17 1020      PT LONG TERM GOAL #1   Title pt to be independent with HEP  Status Achieved     PT LONG TERM GOAL #2   Title patient to improve pain by 50% for greater than 2 weeks   Status Achieved     PT LONG TERM GOAL #3   Title patient to verbalize other PT avenues available to her [facility])   Status Achieved               Plan - 07/11/17 1021    Clinical Impression Statement Patient noting good relief in back pain - partially from PT and partially from steroid injections. Patient reporting ability to return to gym 3x/week with participation in yoga and cardio classes with no issue. Review of prior HEP as well as progression of core and hip strengthening with good carryover. Also given patient handout on [facility] pro bono clinic. All goals met today with great improvements in functional mobility.    PT Treatment/Interventions ADLs/Self Care Home Management;Cryotherapy;Electrical Stimulation;Moist Heat;Traction;Ultrasound;Neuromuscular re-education;Balance training;Therapeutic exercise;Therapeutic activities;Functional mobility training;Gait training;Patient/family education;Manual techniques;Passive range of  motion;Vasopneumatic Device;Taping;Dry needling   Consulted and Agree with Plan of Care Patient      Patient will benefit from skilled therapeutic intervention in order to improve the following deficits and impairments:  Abnormal gait, Decreased activity tolerance, Decreased range of motion, Decreased mobility, Decreased strength, Difficulty walking, Pain  Visit Diagnosis: Chronic low back pain with sciatica, sciatica laterality unspecified, unspecified back pain laterality  Abnormal posture  Muscle weakness (generalized)  Difficulty in walking, not elsewhere classified     Problem List Patient Active Problem List   Diagnosis Date Noted  . Chronic right sacroiliac pain 05/06/2017  . 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 Chronic left sacroiliac joint pain 01/31/2017  . Lumbar degenerative disc disease 01/31/2017  . Urinary frequency 01/29/2017  . Estrogen excess 01/29/2017  . Hot flashes 12/24/2016  . Claustrophobia 12/24/2016  . Vagina itching 08/31/2016  . IUD (intrauterine device) in place 04/22/2016  . Left-sided low back pain without sciatica 04/19/2016  . External hemorrhoids 12/05/2015  . Joint pain 07/08/2015  . Right elbow pain 12/03/2014  . Prediabetes 06/04/2014  . Hair loss 06/04/2014  . Fatty liver disease, nonalcoholic 93/79/0240  . Vitamin D deficiency 06/04/2014  . Overweight (BMI 25.0-29.9) 06/04/2014  . Right knee pain 06/04/2014  . Tension headache 06/04/2014     Lanney Gins, PT, DPT 07/11/17 2:25 PM  PHYSICAL THERAPY DISCHARGE SUMMARY  Visits from Start of Care: 2  Current functional level related to goals / functional outcomes: See above   Remaining deficits: See above   Education / Equipment: HEP  Plan: Patient agrees to discharge.  Patient goals were met. Patient is being discharged due to meeting the stated rehab goals.  ?????     Lanney Gins, PT, DPT 07/11/17 2:25 PM   [facility] [address]  [place], [place], [address] Phone: [phone]   Fax:  [phone]  Name: Jennifer Jones MRN: [id number] Date of Birth: 17-Aug-1976

## 2017-07-17 ENCOUNTER — Ambulatory Visit (HOSPITAL_COMMUNITY): Payer: Self-pay | Admitting: Psychology

## 2017-07-30 IMAGING — US US TRANSVAGINAL NON-OB
1 series · 15 of 25 positions shown · non-contrast
Comparison: CT of the abdomen and pelvis 12/12/2012

CLINICAL DATA: IUD placement. Unable to visualized strings on exam.
Amenorrhea secondary to IUD.



[Series 1: us transvaginal non-ob · 15 of 34 slices shown]
[im 1/34]
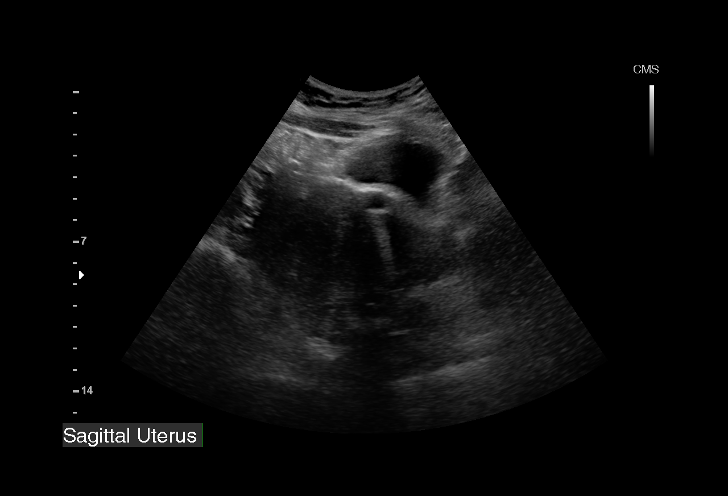
[im 3/34]
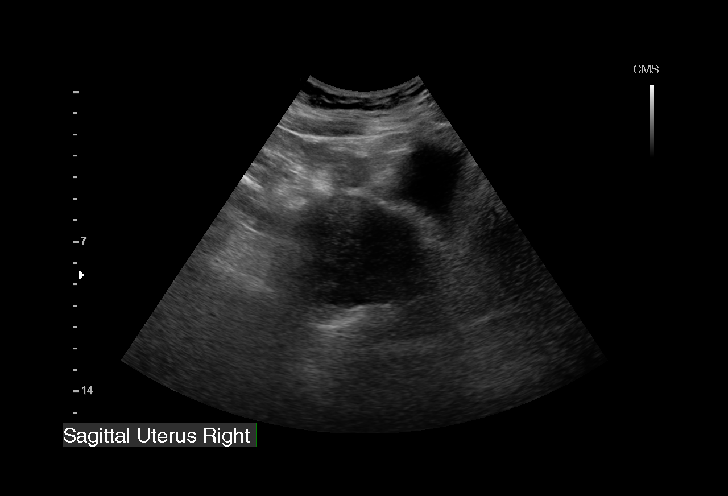
[im 6/34]
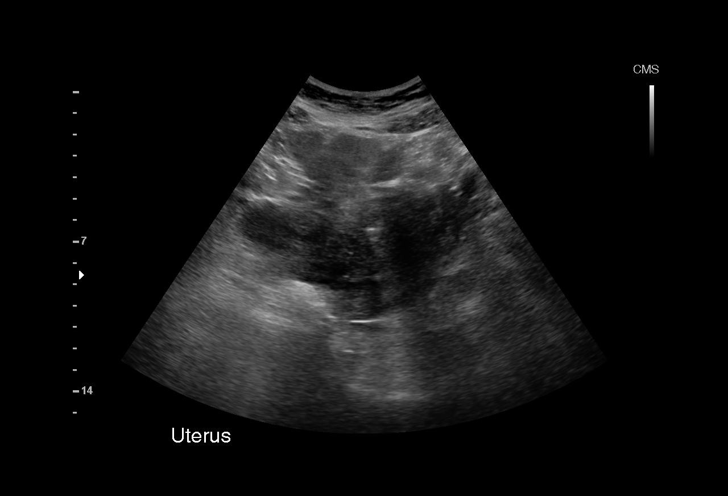
[im 7/34]
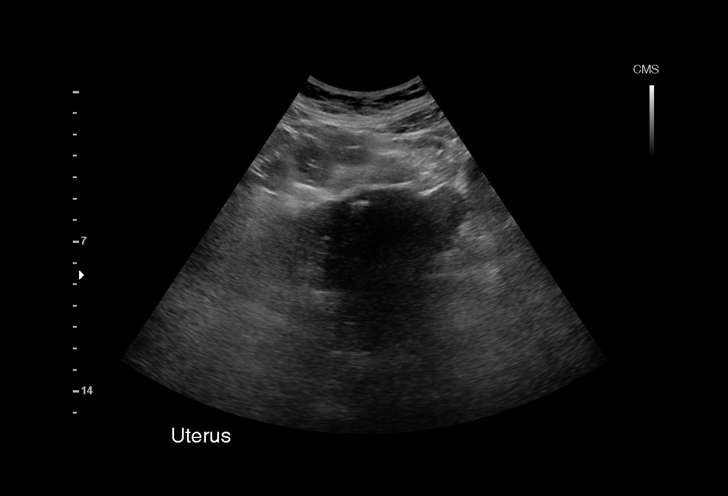
[im 10/34]
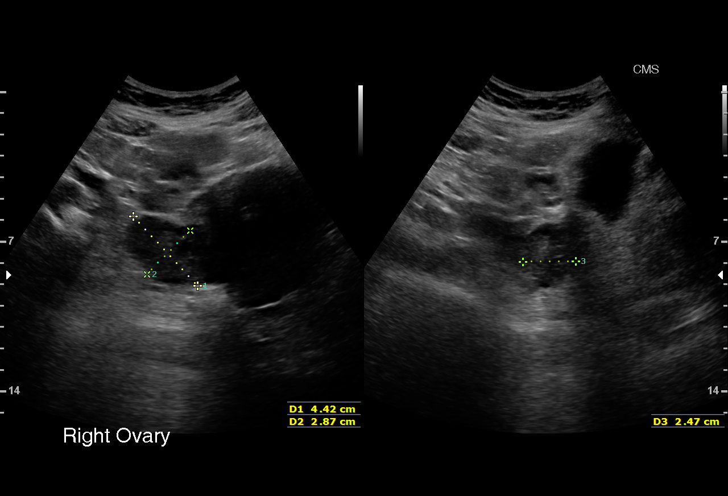
[im 13/34]
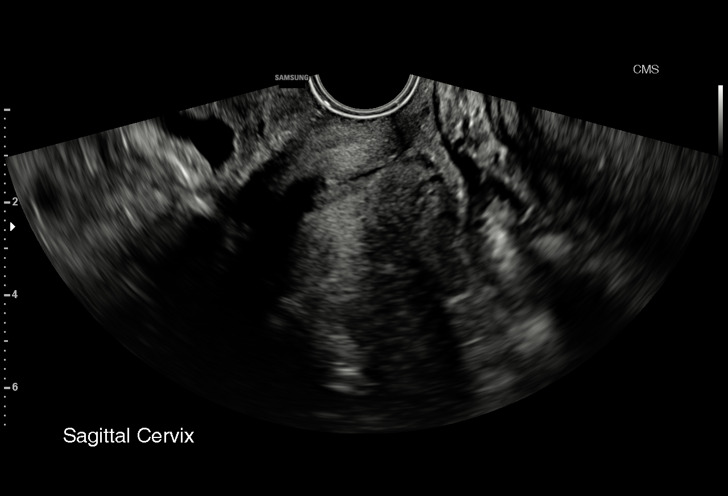
[im 14/34]
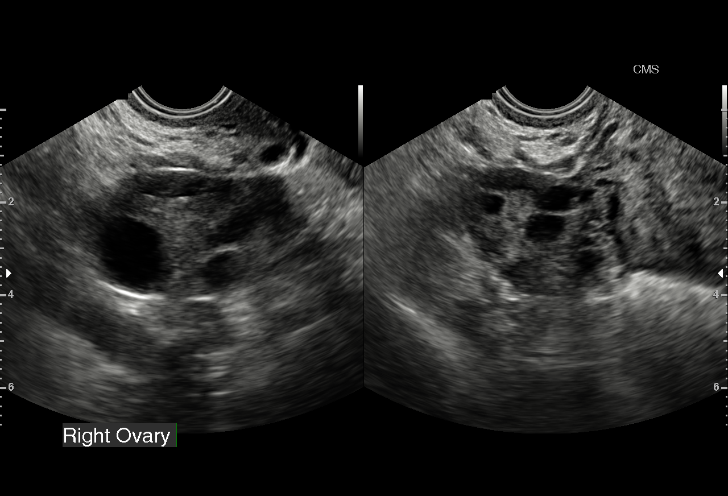
[im 17/34]
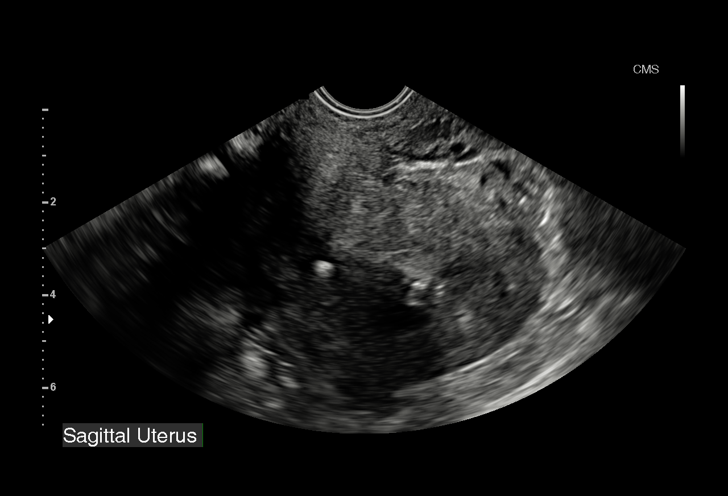
[im 20/34]
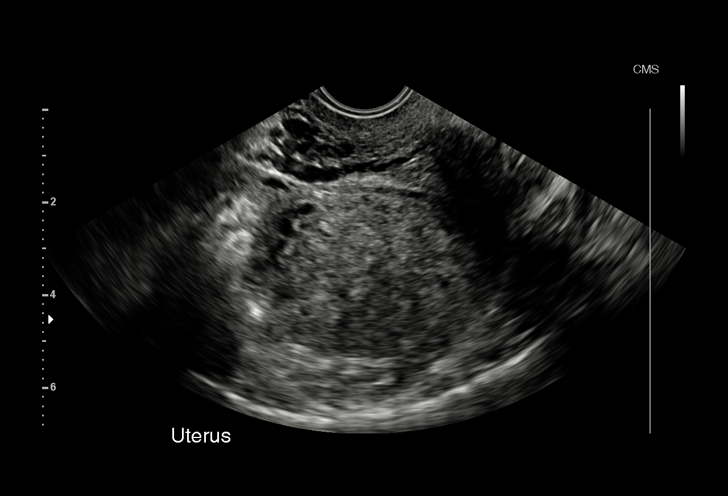
[im 21/34]
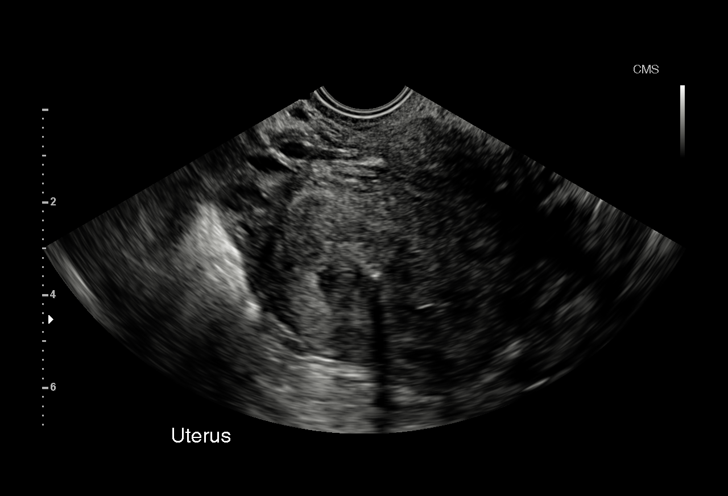
[im 24/34]
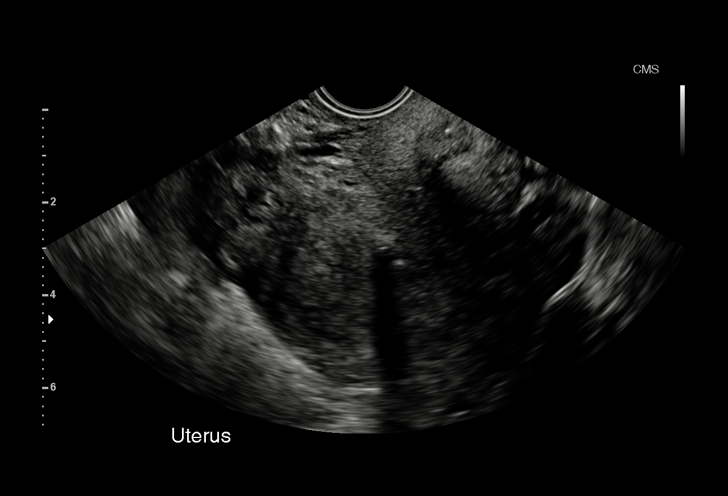
[im 27/34]
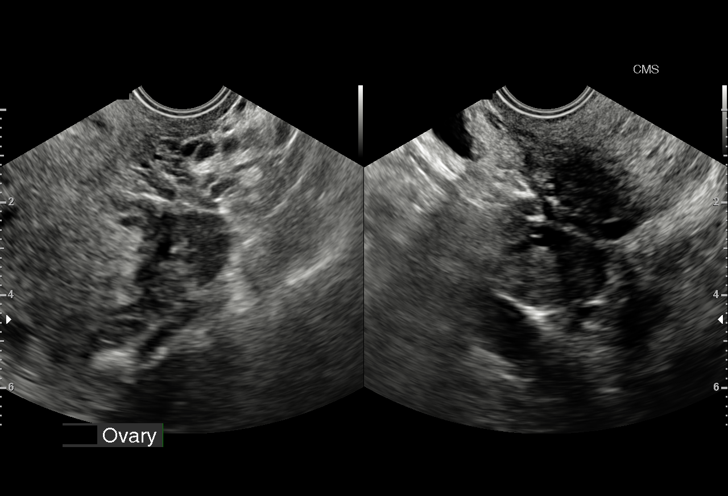
[im 28/34]
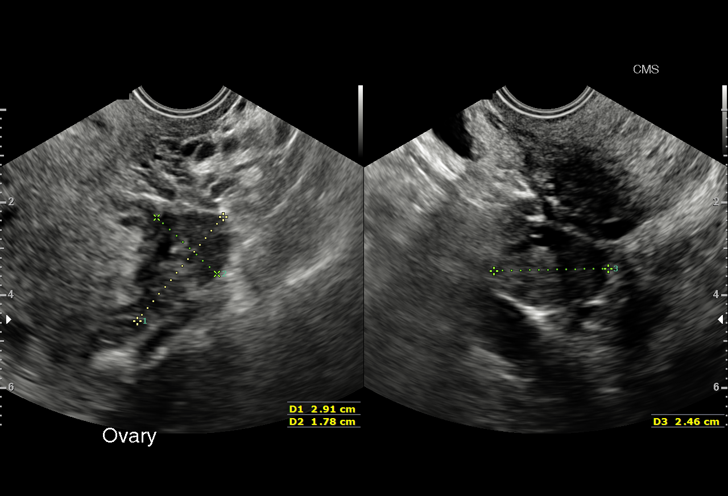
[im 31/34]
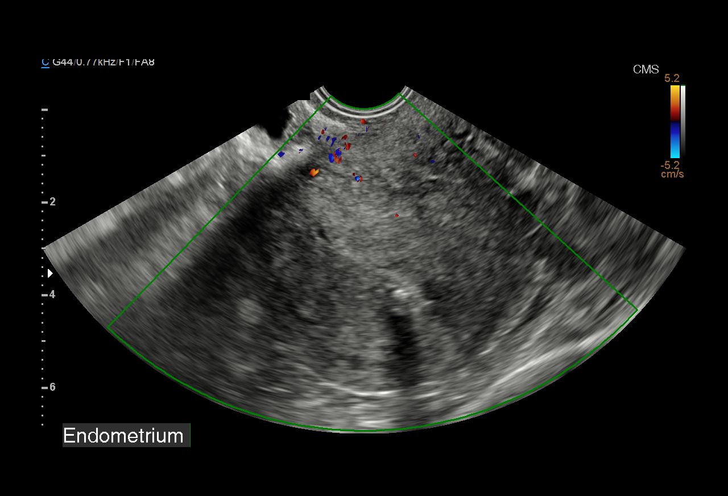
[im 34/34]
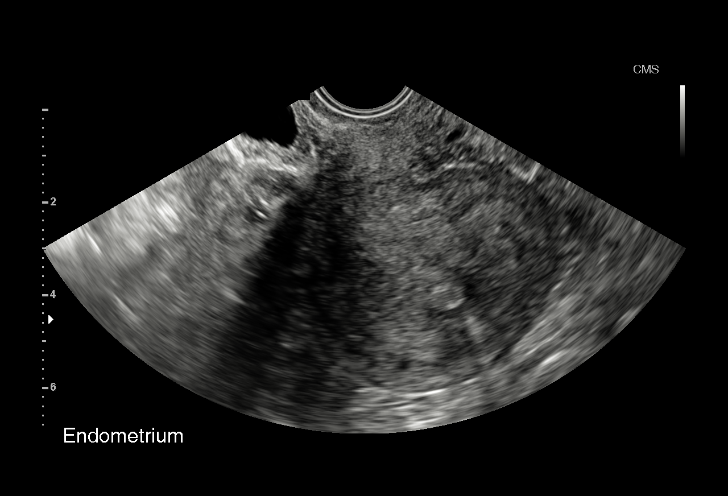

[15 of 25 positions shown; findings below may reference images not displayed]

FINDINGS: Uterus

Measurements: 10.0 x 6.1 x 6.7 cm. Uterus is retroverted.

Endometrium

Thickness: 4.7 mm. IUD identified in the central canal, as expected.

Right ovary

Measurements: 4.7 x 3.1 x 3.0 cm. Normal appearance/no adnexal mass.

Left ovary

Measurements: 2.9 x 2.5 x 1.8 cm. Normal appearance/no adnexal mass.

Other findings

No abnormal free fluid.
IMPRESSION: 1. Retroverted uterus.
2. IUD in appropriate position in the central endometrial canal.
3. Endometrial canal is normal in thickness.
4. No adnexal mass ; ovaries are normal in appearance.

## 2017-08-29 ENCOUNTER — Ambulatory Visit (HOSPITAL_COMMUNITY): Payer: Self-pay | Admitting: Psychiatry

## 2017-09-03 ENCOUNTER — Telehealth: Payer: Self-pay | Admitting: Family Medicine

## 2017-09-03 NOTE — Telephone Encounter (Signed)
Patient must have office visit for this medication as it is not on her active medication list. Please schedule an appt for the patient.

## 2017-09-03 NOTE — Telephone Encounter (Signed)
Pt called to request a prescription for  triamcinolone cream  Pt states that the rash on her neck came back which she was seen for  on 02/16/2016 and was prescribed this cream. Please follow up  -if approved please send it to [facility]

## 2017-10-11 ENCOUNTER — Encounter: Payer: Medicaid Other | Attending: Physical Medicine & Rehabilitation

## 2017-10-11 ENCOUNTER — Ambulatory Visit: Payer: Medicaid Other | Admitting: Physical Medicine & Rehabilitation

## 2017-10-11 ENCOUNTER — Encounter: Payer: Self-pay | Admitting: Physical Medicine & Rehabilitation

## 2017-10-11 VITALS — BP 124/84 | HR 70 | Resp 14

## 2017-10-11 DIAGNOSIS — Z8249 Family history of ischemic heart disease and other diseases of the circulatory system: Secondary | ICD-10-CM | POA: Diagnosis not present

## 2017-10-11 DIAGNOSIS — G8929 Other chronic pain: Secondary | ICD-10-CM

## 2017-10-11 DIAGNOSIS — Z9889 Other specified postprocedural states: Secondary | ICD-10-CM | POA: Insufficient documentation

## 2017-10-11 DIAGNOSIS — K760 Fatty (change of) liver, not elsewhere classified: Secondary | ICD-10-CM | POA: Diagnosis not present

## 2017-10-11 DIAGNOSIS — Z818 Family history of other mental and behavioral disorders: Secondary | ICD-10-CM | POA: Diagnosis not present

## 2017-10-11 DIAGNOSIS — M5137 Other intervertebral disc degeneration, lumbosacral region: Secondary | ICD-10-CM | POA: Diagnosis not present

## 2017-10-11 DIAGNOSIS — M5117 Intervertebral disc disorders with radiculopathy, lumbosacral region: Secondary | ICD-10-CM | POA: Insufficient documentation

## 2017-10-11 DIAGNOSIS — M533 Sacrococcygeal disorders, not elsewhere classified: Secondary | ICD-10-CM

## 2017-10-11 DIAGNOSIS — Z809 Family history of malignant neoplasm, unspecified: Secondary | ICD-10-CM | POA: Insufficient documentation

## 2017-10-11 MED ORDER — TRAMADOL HCL 50 MG PO TABS: 50.0000 mg | ORAL_TABLET | Freq: Two times a day (BID) | ORAL | 5 refills | Status: DC

## 2017-10-11 NOTE — Progress Notes (Signed)
Subjective:    Patient ID: Jennifer Jones, female    DOB: August 06, 1976, 41 y.o.   MRN: [id number]  HPI  41 year old female with chronic low back pain.  She has pain mostly in the low back and buttock area.  She has undergone right sacroiliac injection in May followed by left sacroiliac injection in July followed by bilateral sacroiliac injections in September.  She is concerned about steroid dosage.  We discussed her overall dose as well as frequency of injections.  The patient has been exercising at the gym now as well as started to work in daycare.  In addition patient has been developing some pain going down the left leg down to the foot. No numbness or tingling in the lower limbs.  She does have occasional right great toe pain at night.  She is a diabetic but her hemoglobin A1c is consistently around 6 She states that this occurs when she is at work as well as when she gets home. Pain Inventory Average Pain 9 Pain Right Now 5 My pain is sharp, stabbing and tingling  In the last 24 hours, has pain interfered with the following? General activity 8 Relation with others 9 Enjoyment of life 9 What TIME of day is your pain at its worst? 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  no   Lumbar MRI IMPRESSION: 1. Symptomatic level favored to be L3-L4 where a broad-based left foraminal disc herniation results in up to moderate left foraminal stenosis. Correlate for associated left L3 radiculitis. 2. Chronic disc and endplate degeneration at L5-S1 in the setting of mild chronic retrolisthesis. At the disc space level both lateral recesses are mildly affected, but there is a  superimposed right side caudal disc extrusion or small sequestered fragment suspected displacing the descending right S1 nerve roots.   Electronically Signed   By: Genevie Ann M.D.   On: 10/05/2015 10:29 Physicians involved in your care Any changes since last visit?  no   Family History  Problem Relation Age of Onset  . Hypertension Mother   . Heart disease Mother   . Hyperlipidemia Mother   . Depression Father   . Hyperlipidemia Sister   . Depression Sister   . Hyperlipidemia Brother   . Anxiety disorder Son   . Hyperlipidemia Sister   . Depression Sister   . Hypertension Maternal Grandfather   . Cancer Maternal Grandmother    Social History   Socioeconomic History  . Marital status: Legally Separated    Spouse name: None  . Number of children: 3  . Years of education: bachelor   . Highest education level: None  Social Needs  . Financial resource strain: None  . Food insecurity - worry: None  . Food insecurity - inability: None  . Transportation needs - medical: None  . Transportation needs - non-medical: None  Occupational History  . 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 Smoking status: Never Smoker  . Smokeless tobacco: Never Used  Substance and Sexual Activity  . Alcohol use: No  . Drug use: No  . Sexual activity: Yes    Birth control/protection: IUD    Comment: MIRENA placed  in 2012   Other Topics Concern  . None  Social History Narrative   Lives with 3 sons, husband.   Mom visits from [place].    Past Surgical History:  Procedure Laterality Date  . CESAREAN SECTION  98.02, 05    x 3   Past Medical History:  Diagnosis Date  . Bulging lumbar disc   . Diabetes mellitus, type II ([place])   . Fatty liver   . Fibrocystic breast 03/2007  . 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 H/O varicella   . H/O: menorrhagia 2011  . Hx gestational diabetes 2002 and 2005  . Hx of candidal vulvovaginitis   . Hx: UTI (urinary tract  infection)   . Hyperlipemia   . Sciatica   . Vitamin D deficiency    BP 124/84 (BP Location: Left Arm, Patient Position: Sitting, Cuff Size: Normal)   Pulse 70   Resp 14   SpO2 97%   Opioid Risk Score:   Fall Risk Score:  `1  Depression screen PHQ 2/9  Depression screen [facility] 2/9 06/03/2017 01/29/2017 12/24/2016 08/31/2016 04/19/2016 12/05/2015 08/23/2015  Decreased Interest 0 0 0 0 0 - 0  Down, Depressed, Hopeless 0 0 0 0 0 0 0  PHQ - 2 Score 0 0 0 0 0 0 0  Altered sleeping - 0 0 0 - 0 -  Tired, decreased energy - 1 0 0 - 1 -  Change in appetite - 1 0 0 - 1 -  Feeling bad or failure about yourself  - 0 0 0 - 0 -  Trouble concentrating - 0 0 0 - 0 -  Moving slowly or fidgety/restless - 0 0 0 - 0 -  Suicidal thoughts - 0 0 0 - 0 -  PHQ-9 Score - 2 0 0 - 2 -    Review of Systems  HENT: Negative.   Eyes: Negative.   Respiratory: Negative.   Cardiovascular: Negative.   Gastrointestinal: Negative.   Endocrine: Negative.        High blood sugar  Genitourinary: Negative.   Musculoskeletal: Positive for back pain.  Allergic/Immunologic: Negative.   Neurological: Positive for weakness and numbness.  Psychiatric/Behavioral: The patient is nervous/anxious.        Objective:   Physical Exam  Constitutional: She is oriented to person, place, and time. She appears well-developed and well-nourished. No distress.  HENT:  Head: Normocephalic and atraumatic.  Eyes: Conjunctivae and EOM are normal. Pupils are equal, round, and reactive to light.  Neck: Normal range of motion.  Neurological: She is alert and oriented to person, place, and time. Coordination and gait normal.  Reflex Scores:      Patellar reflexes are 2+ on the right side and 2+ on the left side.      Achilles reflexes are 2+ on the right side and 2+ on the left side. Motor strength is 5/5 bilateral hip flexor knee extensor ankle dorsiflexor. Negative straight leg raise Sensation normal bilateral L4-L5 S1 dermatome  distribution. Deep tendon reflexes normal Gait is without evidence of toe drag or knee instability.  She does not use an assistive device.  Skin: She is not diaphoretic.  Psychiatric: She has a normal mood and affect. Her behavior is normal. Thought content normal.  Nursing note and vitals reviewed.         Assessment & Plan:  #1.  Chronic low back pain she does have evidence of sacroiliac disorder which has been wrist relieved by sacroiliac injections with  average duration of relief of about 2 months. Other treatment options would include radiofrequency neurotomy.  We would first do L5 dorsal ramus S1-S2-S3 lateral branch blocks under fluoroscopic guidance to further assess potential efficacy for radiofrequency.  Would perform the side she is most symptomatic on. 2.  Sciatic pain left lower extremity no evidence of focal neurologic deficits.  This could potentially be related to her lumbar foraminal stenosis.  Radiologically appears L3-4 would be most affected however her symptoms go down to her foot which would suggest L4-L5 or even S1.  At this point they sciatic pain is not consistent or severe.  We discussed recommend continuing her yoga, may incorporate some abdominal strengthening exercise as well as lumbar extensor strengthening and hip extensor strengthening. Continue tramadol 50 milligrams twice daily Patient will call to schedule spinal injection

## 2017-10-11 NOTE — Patient Instructions (Addendum)
Would recommend strengthening of back and abdominal muscles  Please call if you would like a repeat spine injection

## 2017-10-15 HISTORY — PX: COSMETIC SURGERY: SHX468

## 2017-10-21 ENCOUNTER — Encounter (HOSPITAL_COMMUNITY): Payer: Self-pay | Admitting: Emergency Medicine

## 2017-10-21 DIAGNOSIS — F430 Acute stress reaction: Secondary | ICD-10-CM | POA: Insufficient documentation

## 2017-10-21 DIAGNOSIS — Y939 Activity, unspecified: Secondary | ICD-10-CM | POA: Diagnosis not present

## 2017-10-21 DIAGNOSIS — S39012A Strain of muscle, fascia and tendon of lower back, initial encounter: Secondary | ICD-10-CM | POA: Insufficient documentation

## 2017-10-21 DIAGNOSIS — R51 Headache: Secondary | ICD-10-CM | POA: Insufficient documentation

## 2017-10-21 DIAGNOSIS — Z041 Encounter for examination and observation following transport accident: Secondary | ICD-10-CM | POA: Diagnosis present

## 2017-10-21 DIAGNOSIS — Y999 Unspecified external cause status: Secondary | ICD-10-CM | POA: Insufficient documentation

## 2017-10-21 DIAGNOSIS — E119 Type 2 diabetes mellitus without complications: Secondary | ICD-10-CM | POA: Insufficient documentation

## 2017-10-21 DIAGNOSIS — Z79899 Other long term (current) drug therapy: Secondary | ICD-10-CM | POA: Diagnosis not present

## 2017-10-21 DIAGNOSIS — Y929 Unspecified place or not applicable: Secondary | ICD-10-CM | POA: Insufficient documentation
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
## 2017-10-22 ENCOUNTER — Emergency Department (HOSPITAL_COMMUNITY): Payer: Medicaid Other

## 2017-10-22 ENCOUNTER — Emergency Department (HOSPITAL_COMMUNITY)
Admission: EM | Admit: 2017-10-22 | Discharge: 2017-10-22 | Disposition: A | Discharge status: Home / Self Care | Payer: Medicaid Other | Attending: Emergency Medicine | Admitting: Emergency Medicine

## 2017-10-22 DIAGNOSIS — S39012A Strain of muscle, fascia and tendon of lower back, initial encounter: Secondary | ICD-10-CM

## 2017-10-22 DIAGNOSIS — R519 Headache, unspecified: Secondary | ICD-10-CM

## 2017-10-22 DIAGNOSIS — F430 Acute stress reaction: Secondary | ICD-10-CM

## 2017-10-22 DIAGNOSIS — R51 Headache: Secondary | ICD-10-CM

## 2017-10-22 LAB — CBG MONITORING, ED: Glucose-Capillary: 91 mg/dL (ref 65–99)

## 2017-10-22 LAB — POC URINE PREG, ED: PREG TEST UR: NEGATIVE

## 2017-10-22 MED ORDER — HYDROXYZINE HCL 25 MG PO TABS: 50.0000 mg | ORAL_TABLET | Freq: Four times a day (QID) | ORAL | 0 refills | Status: DC | PRN

## 2017-10-22 MED ORDER — DICLOFENAC SODIUM 1 % TD GEL: 2.0000 g | Freq: Four times a day (QID) | TRANSDERMAL | 2 refills | Status: DC

## 2017-10-22 MED ORDER — IBUPROFEN 800 MG PO TABS: 800.0000 mg | ORAL_TABLET | Freq: Three times a day (TID) | ORAL | 0 refills | Status: DC

## 2017-10-22 MED ORDER — LORAZEPAM 1 MG PO TABS
1.0000 mg | ORAL_TABLET | Freq: Once | ORAL | Status: AC
  Administered 2017-10-22: 1 mg via ORAL
  Filled 2017-10-22: qty 1

## 2017-10-22 MED ORDER — IBUPROFEN 200 MG PO TABS
400.0000 mg | ORAL_TABLET | Freq: Once | ORAL | Status: AC
  Administered 2017-10-22: 400 mg via ORAL
  Filled 2017-10-22: qty 2

## 2017-10-22 NOTE — ED Notes (Signed)
Patient transported to CT 
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
## 2017-10-22 NOTE — Discharge Instructions (Signed)
Please follow with your primary care doctor in the next 2 days for a check-up. They must obtain records for further management.  ° °Do not hesitate to return to the Emergency Department for any new, worsening or concerning symptoms.  ° °

## 2017-10-24 ENCOUNTER — Telehealth (HOSPITAL_COMMUNITY): Payer: Self-pay
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
## 2017-10-25 NOTE — Telephone Encounter (Signed)
Oh that's awful, im glad she is coming in sooner. Thank you for the heads up

## 2017-10-28 ENCOUNTER — Ambulatory Visit (INDEPENDENT_AMBULATORY_CARE_PROVIDER_SITE_OTHER): Payer: Medicaid Other | Admitting: Psychiatry

## 2017-10-28 ENCOUNTER — Encounter (HOSPITAL_COMMUNITY): Payer: Self-pay | Admitting: Psychiatry

## 2017-10-28 VITALS — BP 126/74 | HR 96 | Ht 63.0 in | Wt 163.0 lb

## 2017-10-28 DIAGNOSIS — Z975 Presence of (intrauterine) contraceptive device: Secondary | ICD-10-CM

## 2017-10-28 DIAGNOSIS — F430 Acute stress reaction: Secondary | ICD-10-CM

## 2017-10-28 DIAGNOSIS — Z818 Family history of other mental and behavioral disorders: Secondary | ICD-10-CM | POA: Diagnosis not present

## 2017-10-28 DIAGNOSIS — F411 Generalized anxiety disorder: Secondary | ICD-10-CM | POA: Diagnosis not present

## 2017-10-28 MED ORDER — ESCITALOPRAM OXALATE 10 MG PO TABS: 10.0000 mg | ORAL_TABLET | Freq: Every day | ORAL | 1 refills | Status: DC

## 2017-10-28 MED ORDER — HYDROXYZINE HCL 25 MG PO TABS: 50.0000 mg | ORAL_TABLET | Freq: Four times a day (QID) | ORAL | 0 refills | Status: DC | PRN

## 2017-10-28 MED ORDER — PRAZOSIN HCL 2 MG PO CAPS: 2.0000 mg | ORAL_CAPSULE | Freq: Every day | ORAL | 2 refills | Status: DC

## 2017-10-28 NOTE — Patient Instructions (Signed)
Do not take lorazepam (ativan)  Okay to use hydroxyzine if needed for panic  START Lexapro 10 mg tablet once every day in the morning  Use Prazosin 2 mg at night for sleep/anxiety every night
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
## 2017-11-06 ENCOUNTER — Other Ambulatory Visit (HOSPITAL_COMMUNITY): Payer: Self-pay | Admitting: Psychiatry

## 2017-11-06 DIAGNOSIS — F411 Generalized anxiety disorder: Secondary | ICD-10-CM

## 2017-11-06 MED ORDER — ESCITALOPRAM OXALATE 20 MG PO TABS: 20.0000 mg | ORAL_TABLET | Freq: Every day | ORAL | 1 refills | Status: DC

## 2017-11-06 MED ORDER — HYDROXYZINE HCL 25 MG PO TABS: 50.0000 mg | ORAL_TABLET | Freq: Every day | ORAL | 1 refills | Status: DC

## 2017-11-06 NOTE — Progress Notes (Signed)
Spoke with patient. Anxiety is slightly improving, lexapro tolerated without issue.  Continuing to have agoraphobia and avoidance of leaving the home. Agreed to increase lexapro to 20 mg, and assigned homework to leave the home for at least 20 minutes a day, walk to mailbox, walk in neighborhood, sit in car, or sit outside, to begin exposure. Agreed to use vistaril at night for sleep on a continued basis as this has been helpful for her and reducing nightmares. Letter provider to excuse from work for 2 more weeks - she is per diem but requires note to show cause for absence.

## 2017-11-07 ENCOUNTER — Telehealth (HOSPITAL_COMMUNITY): Payer: Self-pay | Admitting: Psychiatry

## 2017-11-07 NOTE — Telephone Encounter (Signed)
11/07/17 - Husband came to pick up letter from Dr. Sharyon Medicus. DL#: [phone]

## 2017-11-14 ENCOUNTER — Ambulatory Visit (HOSPITAL_COMMUNITY): Payer: Self-pay | Admitting: Psychiatry

## 2017-11-27 ENCOUNTER — Other Ambulatory Visit: Payer: Self-pay
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
## 2017-11-28 ENCOUNTER — Telehealth: Payer: Self-pay | Admitting: Physical Medicine & Rehabilitation

## 2017-11-28 NOTE — Telephone Encounter (Signed)
Yes, this was already responded to earlier today, response was:  May have pt see me at next available, ?today, and next appointment is 12/03/2017
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
## 2017-11-28 NOTE — Telephone Encounter (Signed)
I think I have already responded to this 1.  Have her follow-up with me next available appointment.  In the meantime she can use heat alternating with ice 30 minutes at a time every couple hours as needed

## 2017-11-28 NOTE — Telephone Encounter (Signed)
May have pt see me at next available, ?today

## 2017-11-28 NOTE — Telephone Encounter (Signed)
Patient scheduled for 12/03/2017

## 2017-11-29 NOTE — Telephone Encounter (Signed)
appt made

## 2017-12-03 ENCOUNTER — Encounter: Payer: Medicaid Other | Attending: Physical Medicine & Rehabilitation

## 2017-12-03 ENCOUNTER — Encounter: Payer: Self-pay | Admitting: Physical Medicine & Rehabilitation

## 2017-12-03 ENCOUNTER — Ambulatory Visit: Payer: Medicaid Other | Admitting: Physical Medicine & Rehabilitation

## 2017-12-03 VITALS — BP 119/84 | HR 91

## 2017-12-03 DIAGNOSIS — G8929 Other chronic pain: Secondary | ICD-10-CM | POA: Diagnosis not present

## 2017-12-03 DIAGNOSIS — Z809 Family history of malignant neoplasm, unspecified: Secondary | ICD-10-CM | POA: Insufficient documentation

## 2017-12-03 DIAGNOSIS — M5137 Other intervertebral disc degeneration, lumbosacral region: Secondary | ICD-10-CM | POA: Insufficient documentation

## 2017-12-03 DIAGNOSIS — M5117 Intervertebral disc disorders with radiculopathy, lumbosacral region: Secondary | ICD-10-CM | POA: Insufficient documentation

## 2017-12-03 DIAGNOSIS — Z9889 Other specified postprocedural states: Secondary | ICD-10-CM | POA: Diagnosis not present

## 2017-12-03 DIAGNOSIS — Z79899 Other long term (current) drug therapy: Secondary | ICD-10-CM | POA: Diagnosis not present

## 2017-12-03 DIAGNOSIS — K760 Fatty (change of) liver, not elsewhere classified: Secondary | ICD-10-CM | POA: Diagnosis not present

## 2017-12-03 DIAGNOSIS — Z818 Family history of other mental and behavioral disorders: Secondary | ICD-10-CM | POA: Diagnosis not present

## 2017-12-03 DIAGNOSIS — M533 Sacrococcygeal disorders, not elsewhere classified: Secondary | ICD-10-CM | POA: Insufficient documentation

## 2017-12-03 DIAGNOSIS — Z8249 Family history of ischemic heart disease and other diseases of the circulatory system: Secondary | ICD-10-CM | POA: Insufficient documentation

## 2017-12-03 DIAGNOSIS — Z5181 Encounter for therapeutic drug level monitoring: Secondary | ICD-10-CM | POA: Diagnosis not present
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
## 2017-12-08 LAB — TOXASSURE SELECT,+ANTIDEPR,UR

## 2017-12-09 ENCOUNTER — Telehealth: Payer: Self-pay | Admitting: *Deleted

## 2017-12-09 NOTE — Telephone Encounter (Signed)
Urine drug screen for this encounter is consistent for no prescribed medication. Last dose of tramadol reported to be one month ago.

## 2017-12-16 ENCOUNTER — Ambulatory Visit: Payer: Self-pay | Admitting: Physical Medicine & Rehabilitation

## 2017-12-26 ENCOUNTER — Ambulatory Visit (INDEPENDENT_AMBULATORY_CARE_PROVIDER_SITE_OTHER): Payer: Medicaid Other | Admitting: Psychiatry

## 2017-12-26 ENCOUNTER — Encounter (HOSPITAL_COMMUNITY): Payer: Self-pay | Admitting: Psychiatry

## 2017-12-26 DIAGNOSIS — Z818 Family history of other mental and behavioral disorders: Secondary | ICD-10-CM

## 2017-12-26 DIAGNOSIS — F430 Acute stress reaction: Secondary | ICD-10-CM

## 2017-12-26 DIAGNOSIS — Z975 Presence of (intrauterine) contraceptive device: Secondary | ICD-10-CM | POA: Diagnosis not present

## 2017-12-26 DIAGNOSIS — F40298 Other specified phobia: Secondary | ICD-10-CM

## 2017-12-26 DIAGNOSIS — F411 Generalized anxiety disorder: Secondary | ICD-10-CM

## 2017-12-26 MED ORDER — FLUOXETINE HCL 20 MG PO CAPS: 20.0000 mg | ORAL_CAPSULE | Freq: Every day | ORAL | 0 refills | Status: DC

## 2017-12-26 MED ORDER — HYDROXYZINE HCL 25 MG PO TABS: 50.0000 mg | ORAL_TABLET | Freq: Three times a day (TID) | ORAL | 1 refills | Status: DC | PRN

## 2017-12-26 MED ORDER — FLUOXETINE HCL 40 MG PO CAPS: 40.0000 mg | ORAL_CAPSULE | Freq: Every day | ORAL | 0 refills | Status: DC

## 2017-12-26 MED ORDER — PRAZOSIN HCL 2 MG PO CAPS: 2.0000 mg | ORAL_CAPSULE | Freq: Every day | ORAL | 1 refills | Status: DC
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
## 2018-01-07 ENCOUNTER — Other Ambulatory Visit: Payer: Self-pay

## 2018-01-07 NOTE — Telephone Encounter (Signed)
Pt can take Ibuprofen 800mg  TID with food prn in place of Naproxen.  They are both NSAIDs and should not be taken together

## 2018-01-07 NOTE — Telephone Encounter (Signed)
Patient called requesting a refill of naproxen.  According to last note:  Bilateral Sacroiliac injection Sept 2018 Was helpful Heating pad Taking ibuprofen 800mg  Only received tramadol 14 tabs last fill due to insurance reasons Discussed that urine drug screen needed since she is on chronic narcotic analgesic  Last time patient prescribed naproxen was 06/03/2018. Is it ok to refill this medication?  Please advise.

## 2018-01-08 MED ORDER — NAPROXEN 500 MG PO TABS: 500.0000 mg | ORAL_TABLET | Freq: Two times a day (BID) | ORAL | 1 refills | Status: DC

## 2018-02-06 ENCOUNTER — Ambulatory Visit (HOSPITAL_COMMUNITY): Payer: Self-pay | Admitting: Psychology

## 2018-02-26 ENCOUNTER — Ambulatory Visit (HOSPITAL_COMMUNITY): Payer: Self-pay | Admitting: Psychiatry

## 2018-03-21 ENCOUNTER — Ambulatory Visit: Payer: Self-pay | Admitting: Physical Medicine & Rehabilitation

## 2018-03-25 ENCOUNTER — Ambulatory Visit (HOSPITAL_COMMUNITY): Payer: Medicaid Other | Admitting: Psychiatry

## 2018-03-28 ENCOUNTER — Other Ambulatory Visit (HOSPITAL_COMMUNITY)
Admission: RE | Admit: 2018-03-28 | Discharge: 2018-03-28 | Disposition: A | Discharge status: Home / Self Care | Payer: Medicaid Other | Source: Ambulatory Visit | Attending: Family Medicine | Admitting: Family Medicine

## 2018-03-28 ENCOUNTER — Other Ambulatory Visit: Payer: Self-pay | Admitting: Family Medicine

## 2018-03-28 DIAGNOSIS — Z01419 Encounter for gynecological examination (general) (routine) without abnormal findings: Secondary | ICD-10-CM | POA: Insufficient documentation

## 2018-03-29 ENCOUNTER — Other Ambulatory Visit: Payer: Self-pay | Admitting: Family Medicine

## 2018-03-29 DIAGNOSIS — R109 Unspecified abdominal pain: Secondary | ICD-10-CM

## 2018-03-31 LAB — CYTOLOGY - PAP
Diagnosis: NEGATIVE
HPV (WINDOPATH): NOT DETECTED

## 2018-04-11 ENCOUNTER — Ambulatory Visit
Admission: RE | Admit: 2018-04-11 | Discharge: 2018-04-11 | Disposition: A | Discharge status: Home / Self Care | Payer: Medicaid Other | Source: Ambulatory Visit | Attending: Family Medicine | Admitting: Family Medicine

## 2018-04-11 DIAGNOSIS — R109 Unspecified abdominal pain: Secondary | ICD-10-CM

## 2018-05-08 ENCOUNTER — Ambulatory Visit (INDEPENDENT_AMBULATORY_CARE_PROVIDER_SITE_OTHER): Payer: Medicaid Other | Admitting: Psychology

## 2018-05-08 ENCOUNTER — Encounter (HOSPITAL_COMMUNITY): Payer: Self-pay | Admitting: Psychology

## 2018-05-08 DIAGNOSIS — F4310 Post-traumatic stress disorder, unspecified: Secondary | ICD-10-CM
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
## 2018-08-01 ENCOUNTER — Ambulatory Visit (INDEPENDENT_AMBULATORY_CARE_PROVIDER_SITE_OTHER): Payer: Medicaid Other | Admitting: Obstetrics & Gynecology

## 2018-08-01 ENCOUNTER — Encounter: Payer: Self-pay | Admitting: Obstetrics & Gynecology

## 2018-08-01 VITALS — BP 106/74 | HR 84 | Ht 62.0 in | Wt 164.0 lb

## 2018-08-01 DIAGNOSIS — R102 Pelvic and perineal pain: Secondary | ICD-10-CM

## 2018-08-01 DIAGNOSIS — Z1231 Encounter for screening mammogram for malignant neoplasm of breast: Secondary | ICD-10-CM

## 2018-08-01 DIAGNOSIS — Z1239 Encounter for other screening for malignant neoplasm of breast: Secondary | ICD-10-CM

## 2018-08-01 MED ORDER — NAPROXEN 500 MG PO TABS: 500.0000 mg | ORAL_TABLET | Freq: Two times a day (BID) | ORAL | 1 refills | Status: DC

## 2018-08-01 NOTE — Progress Notes (Signed)
History:  42 y.o. [id number] here today for eval of ovarian cyst. LMP irreg with IUD.  Pain is mainly with intercourse and has occurred since July.  [place] and reports that she was prescribed Provera at some point and was told that taking the meds would resolve the cyst. Pt reports no abnormal discharge. Her menses are irreg due to [place]. She did take Provera but, did not have a withdrawal bleed.      The following portions of the patient's history were reviewed and updated as appropriate: allergies, current medications, past family history, past medical history, past social history, past surgical history and problem list.  Review of Systems:  Pertinent items are noted in HPI.    Objective:  Physical Exam Blood pressure 106/74, pulse 84, height 5\' 2"  (1.575 m), weight 164 lb (74.4 kg).  CONSTITUTIONAL: Well-developed, well-nourished female in no acute distress.  HENT:  Normocephalic, atraumatic EYES: Conjunctivae and EOM are normal. No scleral icterus.  NECK: Normal range of motion SKIN: Skin is warm and dry. No rash noted. Not diaphoretic.No pallor. [place]: Alert and oriented to person, place, and time. Normal coordination.  Abd: Soft, nontender and nondistended Pelvic: Normal appearing external genitalia; normal appearing vaginal mucosa and cervix.  Normal discharge.  Small uterus, no other palpable masses, no uterine or adnexal tenderness  Labs and Imaging [place] image that pt brought in shows a small simple cyst. Size not seen. The report was not in [place]:  Ovarian cyst  This appears to be a functional cyst but, was on the contralateral side   I have spent time explaining to pt that there is no med that will dissolve the cyst    Pelvic pain-  TV US  Naproxen 500mg  q 6-8 hours prn pain and prior to intercourse   Screening mammogram  Total face-to-face time with patient was 15 min.  Greater than 50% was spent in counseling and  coordination of care with the patient.   Chinonso Linker L. Harraway-Smith, M.D., [facility]lynn June

## 2018-08-05 ENCOUNTER — Ambulatory Visit (HOSPITAL_COMMUNITY)
Admission: RE | Admit: 2018-08-05 | Discharge: 2018-08-05 | Disposition: A | Discharge status: Home / Self Care | Payer: Medicaid Other | Source: Ambulatory Visit | Attending: Obstetrics & Gynecology | Admitting: Obstetrics & Gynecology

## 2018-08-05 DIAGNOSIS — N8302 Follicular cyst of left ovary: Secondary | ICD-10-CM | POA: Diagnosis not present

## 2018-08-05 DIAGNOSIS — R102 Pelvic and perineal pain: Secondary | ICD-10-CM

## 2018-08-05 DIAGNOSIS — D259 Leiomyoma of uterus, unspecified: Secondary | ICD-10-CM | POA: Insufficient documentation

## 2018-08-14 ENCOUNTER — Telehealth: Payer: Self-pay | Admitting: *Deleted

## 2018-08-14 ENCOUNTER — Telehealth: Payer: Self-pay | Admitting: Obstetrics & Gynecology

## 2018-08-14 NOTE — Telephone Encounter (Signed)
Jennifer Jones left a voice message this afternoon stating she wants the results of her US done 2 weeks ago.

## 2018-08-14 NOTE — Telephone Encounter (Signed)
I called [facility] and  Reviewed results with her of her pelvic US showing ovarian cyst, fibroid, and IUD in correct location. She asked if I would send message to her doctor to ask what is the next step. I assured her I would forward message to her doctor.

## 2018-08-14 NOTE — Telephone Encounter (Signed)
TC to pt to notify her of the location of the IUD, the simple cyst and the fibroids. There is nothing concerning about the [place].  All of her questions were answered and I recommend f/u in 1 year or sooner prn.  Nuriya Stuck L. Harraway-Smith, M.D., [facility]nn June

## 2018-09-05 ENCOUNTER — Ambulatory Visit: Payer: Medicaid Other | Admitting: Diagnostic Neuroimaging

## 2018-09-05 ENCOUNTER — Encounter: Payer: Self-pay | Admitting: Diagnostic Neuroimaging

## 2018-09-05 VITALS — BP 113/70 | HR 67 | Ht 62.0 in | Wt 161.0 lb

## 2018-09-05 DIAGNOSIS — R200 Anesthesia of skin: Secondary | ICD-10-CM

## 2018-09-05 NOTE — Progress Notes (Signed)
[facility]  PATIENT: Jennifer Jones DOB: 08-29-1976  REFERRING CLINICIAN: Earney Mallet HISTORY FROM: patient  REASON FOR VISIT: new consult    HISTORICAL  CHIEF COMPLAINT:  Chief Complaint  Patient presents with  . Paresthesias in feet    rm 7, New Pt, "numbness in feet with discoloration x 3 months, occurred following [place] and then sleeping 13 hours"    HISTORY OF PRESENT ILLNESS:   42 year old female here for evaluation of numbness in the feet.  August 2019 patient was having cosmetic surgery, 1 hour procedure under general anesthesia.  When she woke up she was very tired and groggy.  She slept for the next 13 hours.  When she woke up from her sleep she noticed some numbness and tingling sensation in bilateral heels.  She also noted some discoloration on her lateral malleoli bilaterally.  She was having significant pain and difficulty standing up.  Symptoms were severe for 2 weeks and gradually improved.  Eventually she realized that she must have been crossing her feet during her 13 hours sleep postoperatively.  Since that time symptoms have improved but still continues with numbness on the bilateral heel region.  She has some mild discoloration on the skin.  No weakness.  No problems with her fingers or arms.  No vision changes slurred speech or trouble talking. She does have history of diabetes with hemoglobin A1c around 5.9-6.0.  No history of diabetic neuropathy.   REVIEW OF SYSTEMS: Full 14 system review of systems performed and negative with exception of: Numbness anxiety.  ALLERGIES: Allergies  Allergen Reactions  . Codeine Itching  . Darvocet [Propoxyphene N-Acetaminophen] Itching  . Dilaudid [Hydromorphone Hcl] Itching  . Percocet [Oxycodone-Acetaminophen] Itching  . Triamcinolone Rash    Cream caused rash    HOME MEDICATIONS: Outpatient Medications Prior to Visit  Medication Sig Dispense Refill  . Blood Glucose Monitoring Suppl  (TRUE METRIX METER) W/DEVICE KIT Used as instructed 1 kit 0  . cholecalciferol (VITAMIN D) 1000 UNITS tablet Take 1,000 Units by mouth daily.     Marland Kitchen glucose blood (TRUE METRIX BLOOD GLUCOSE TEST) test strip Use as instructed 100 each 12  . hydrOXYzine (ATARAX/VISTARIL) 25 MG tablet Take 2 tablets (50 mg total) by mouth every 8 (eight) hours as needed for anxiety. 90 tablet 1  . levonorgestrel (MIRENA) 20 MCG/24HR IUD 1 each by Intrauterine route once.    . metFORMIN (GLUCOPHAGE) 1000 MG tablet Take 1 tablet (1,000 mg total) by mouth 2 (two) times daily with a meal. 60 tablet 11  . naproxen (NAPROSYN) 500 MG tablet Take 1 tablet (500 mg total) by mouth 2 (two) times daily with a meal. 60 tablet 1  . simvastatin (ZOCOR) 10 MG tablet Take 1 tablet (10 mg total) by mouth at bedtime. 30 tablet 11  . traMADol (ULTRAM) 50 MG tablet Take 1 tablet (50 mg total) by mouth 2 (two) times daily. 60 tablet 5  . TRUEPLUS LANCETS 28G MISC Use as directed 100 each 12   No facility-administered medications prior to visit.     PAST MEDICAL HISTORY: Past Medical History:  Diagnosis Date  . Bulging lumbar disc   . Diabetes mellitus, type II ([place])   . Fatty liver   . Fibrocystic breast 03/2007  . 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 H/O varicella   . H/O: menorrhagia 2011  . Hx gestational diabetes 2002 and 2005  . Hx of candidal vulvovaginitis   . Hx: UTI (urinary tract  infection)   . Hyperlipemia   . Sciatica   . Vitamin D deficiency     PAST SURGICAL HISTORY: Past Surgical History:  Procedure Laterality Date  . CESAREAN SECTION  98.02, 05    x 3  . COSMETIC SURGERY  2019    FAMILY HISTORY: Family History  Problem Relation Age of Onset  . Hypertension Mother   . Heart disease Mother   . Hyperlipidemia Mother   . Depression Father   . Hyperlipidemia Sister   . Depression Sister   . Hyperlipidemia Brother   . Anxiety disorder Son   . Hyperlipidemia Sister   . Depression Sister   .  Hypertension Maternal Grandfather   . Cancer Maternal Grandmother     SOCIAL HISTORY: Social History   Socioeconomic History  . Marital status: Legally Separated    Spouse name: Not on file  . Number of children: 3  . Years of education: bachelor + 2  . Highest education level: Not on file  Occupational History  . 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 Financial resource strain: Not on file  . Food insecurity:    Worry: Not on file    Inability: Not on file  . Transportation needs:    Medical: Not on file    Non-medical: Not on file  Tobacco Use  . Smoking status: Never Smoker  . Smokeless tobacco: Never Used  Substance and Sexual Activity  . Alcohol use: Never    Frequency: Never  . Drug use: Never  . Sexual activity: Yes    Birth control/protection: IUD    Comment: MIRENA placed in 2012   Lifestyle  . Physical activity:    Days per week: Not on file    Minutes per session: Not on file  . Stress: Not on file  Relationships  . Social connections:    Talks on phone: Not on file    Gets together: Not on file    Attends religious service: Not on file    Active member of club or organization: Not on file    Attends meetings of clubs or organizations: Not on file    Relationship status: Not on file  . Intimate partner violence:    Fear of current or ex partner: Not on file    Emotionally abused: Not on file    Physically abused: Not on file    Forced sexual activity: Not on file  Other Topics Concern  . Not on file  Social History Narrative   Lives with 3 sons.    Mom visits from [place].    Caffeine- coffee, 1 daily     PHYSICAL EXAM  GENERAL EXAM/CONSTITUTIONAL: Vitals:  Vitals:   09/05/18 0821  BP: 113/70  Pulse: 67  Weight: 161 lb (73 kg)  Height: 5' 2"  (1.575 m)     Body mass index is 29.45 kg/m. Wt Readings from Last 3 Encounters:  09/05/18 161 lb (73 kg)  08/01/18 164 lb (74.4 kg)  10/21/17 168 lb (76.2  kg)     Patient is in no distress; well developed, nourished and groomed; neck is supple  CARDIOVASCULAR:  Examination of carotid arteries is normal; no carotid bruits  Regular rate and rhythm, no murmurs  Examination of peripheral vascular system by observation and palpation is normal  EYES:  Ophthalmoscopic exam of optic discs and posterior segments is normal; no papilledema or hemorrhages  Visual Acuity Screening  Right eye Left eye Both eyes  Without correction: 20/100 20/20   With correction:     Comments: wears reading glasses    MUSCULOSKELETAL:  Gait, strength, tone, movements noted in Neurologic exam below  NEUROLOGIC: MENTAL STATUS:  No flowsheet data found.  awake, alert, oriented to person, place and time  recent and remote memory intact  normal attention and concentration  language fluent, comprehension intact, naming intact  fund of knowledge appropriate  CRANIAL NERVE:   2nd - no papilledema on fundoscopic exam  2nd, 3rd, 4th, 6th - pupils equal and reactive to light, visual fields full to confrontation, extraocular muscles intact, no nystagmus  5th - facial sensation symmetric  7th - facial strength symmetric  8th - hearing intact  9th - palate elevates symmetrically, uvula midline  11th - shoulder shrug symmetric  12th - tongue protrusion midline  MOTOR:   normal bulk and tone, full strength in the BUE, BLE  SENSORY:   normal and symmetric to light touch, temperature, vibration; SLIGHTLY REDUCED PINPRICK IN BILATERAL HEELS  COORDINATION:   finger-nose-finger, fine finger movements normal  REFLEXES:   deep tendon reflexes present and symmetric  GAIT/STATION:   narrow based gait; romberg is negative     DIAGNOSTIC DATA (LABS, IMAGING, TESTING) - I reviewed patient records, labs, notes, testing and imaging myself where available.  Lab Results  Component Value Date   WBC 10.3 05/09/2016   HGB 13.2 05/09/2016    HCT 39.4 05/09/2016   MCV 87.0 05/09/2016   PLT 243 05/09/2016      Component Value Date/Time   NA 139 12/29/2015 1218   K 4.0 12/29/2015 1218   CL 100 12/29/2015 1218   CO2 27 12/29/2015 1218   GLUCOSE 92 12/29/2015 1218   BUN 12 12/29/2015 1218   CREATININE 0.62 12/29/2015 1218   CALCIUM 9.6 12/29/2015 1218   PROT 7.7 12/29/2015 1218   ALBUMIN 4.7 12/29/2015 1218   AST 18 12/29/2015 1218   ALT 18 12/29/2015 1218   ALKPHOS 49 12/29/2015 1218   BILITOT 0.8 12/29/2015 1218   GFRNONAA >89 12/29/2015 1218   GFRAA >89 12/29/2015 1218   Lab Results  Component Value Date   CHOL 155 12/24/2016   HDL 36 (L) 12/24/2016   LDLCALC 98 12/24/2016   TRIG 107 12/24/2016   CHOLHDL 4.3 12/24/2016   Lab Results  Component Value Date   HGBA1C 6.0 12/24/2016   Lab Results  Component Value Date   VITAMINB12 637 12/24/2016   Lab Results  Component Value Date   TSH 0.54 12/24/2016    MRI lumbar spine [I reviewed images myself and agree with interpretation. -VRP] 1. Symptomatic level favored to be L3-L4 where a broad-based left foraminal disc herniation results in up to moderate left foraminal stenosis. Correlate for associated left L3 radiculitis. 2. Chronic disc and endplate degeneration at L5-S1 in the setting of mild chronic retrolisthesis. At the disc space level both lateral recesses are mildly affected, but there is a superimposed right side caudal disc extrusion or small sequestered fragment suspected displacing the descending right S1 nerve roots. See series 500, image 31.    ASSESSMENT AND PLAN  42 y.o. year old female here with mild numbness in bilateral heels, following prolonged possible compression due to cross leg position during prolonged sleep following surgery in August 2019.  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  Symptoms have improved but are persistent.  Advised to avoid additional compression situations and hopefully symptoms will gradually improve  over  the next 6 to 8 months.  Dx:  1. Numbness in feet       PLAN:  - monitor; improvement over time is expected  Return if symptoms worsen or fail to improve, for return to PCP.    Penni Bombard, MD 19/47/1252, 7:12 AM Certified in Neurology, Neurophysiology and Neuroimaging  [facility] [address], [place], [place] [address] [phone]

## 2018-09-10 ENCOUNTER — Ambulatory Visit
Admission: RE | Admit: 2018-09-10 | Discharge: 2018-09-10 | Disposition: A | Discharge status: Home / Self Care | Payer: Medicaid Other | Source: Ambulatory Visit | Attending: Obstetrics & Gynecology | Admitting: Obstetrics & Gynecology

## 2018-09-10 DIAGNOSIS — Z1231 Encounter for screening mammogram for malignant neoplasm of breast: Secondary | ICD-10-CM

## 2018-11-06 ENCOUNTER — Ambulatory Visit (INDEPENDENT_AMBULATORY_CARE_PROVIDER_SITE_OTHER): Payer: Medicaid Other | Admitting: Psychology

## 2018-11-06 DIAGNOSIS — F411 Generalized anxiety disorder: Secondary | ICD-10-CM

## 2018-11-06 NOTE — Progress Notes (Signed)
   THERAPIST PROGRESS NOTE  Session Time: 1.35pm-2.25pm  Participation Level: Active  Behavioral Response: Well GroomedAlertAnxious  Type of Therapy: Individual Therapy  Treatment Goals addressed: Diagnosis: GAD, PTSD and goal 1.  Interventions: CBT, Supportive and Other: grounding skills  Summary: Jennifer Jones is a 43 y.o. female who presents with affect congruent w/ report of anxiety.  Pt reported over the past 1-2 months anxiety has been increasing.  Pt reports she feels so overwhelmed constantly- always anxious and shaky and impacting day to day.  Pt rates her anxiety a 10 on scale of 10 highest. Pt reports she isn't sleeping well- difficulty falling asleep and then waking after 2.5 hours.  Pt reports unable to go back to sleep.  Pt reported that she hasn't wanted to go out and do things- pt reports she tried to go to her cardio dance class and just couldn't focus/concentrate to participate.  Pt reports she still experiencing a lot of hypervigilance when driving. Pt reports also will wake and check windows and doors- check on sons.  Pt expressed worry for her parents- went to visit in August for dad to have surgery and he refused- feels that he has given up trying.  Pt reports hx of depression- dad depressed- sister depressed and concerned that where she is headed if doesn't get anxiety under control.  Pt reports she will occasionally take the hydroxyzine if anxiety bad- took last night to assist w/ sleep. Pt agrees for referral back to psychiatrist. Pt participated in grounding activities and reports feel more at ease- agrees to continue practice for self.   Suicidal/Homicidal: Nowithout intent/plan  Therapist Response: Assessed pt current functioning per pt report.  Processed w/pt increased anxiety- explored impact and current stressors.  Discussed tx and returning to psychiatrist.  Practiced w/pt grounding skills- using senses, breathing practices and gentle slow movements. Encouraged  daily practice.  Plan: Return again in 2 weeks. Referred to Dr. Darel Hong  Diagnosis: GAD, ptSd   Jan Fireman, [facility] 11/06/2018

## 2018-11-10 ENCOUNTER — Encounter (HOSPITAL_COMMUNITY): Payer: Self-pay | Admitting: Psychiatry

## 2018-11-10 ENCOUNTER — Ambulatory Visit (INDEPENDENT_AMBULATORY_CARE_PROVIDER_SITE_OTHER): Payer: Medicaid Other | Admitting: Psychiatry

## 2018-11-10 VITALS — BP 123/86 | HR 70 | Ht 62.0 in | Wt 166.6 lb

## 2018-11-10 DIAGNOSIS — F4310 Post-traumatic stress disorder, unspecified: Secondary | ICD-10-CM | POA: Diagnosis not present

## 2018-11-10 DIAGNOSIS — F411 Generalized anxiety disorder: Secondary | ICD-10-CM | POA: Insufficient documentation

## 2018-11-10 MED ORDER — HYDROXYZINE HCL 25 MG PO TABS: 50.0000 mg | ORAL_TABLET | Freq: Three times a day (TID) | ORAL | 1 refills | Status: AC | PRN

## 2018-11-10 MED ORDER — MIRTAZAPINE 15 MG PO TABS: 15.0000 mg | ORAL_TABLET | Freq: Every day | ORAL | 0 refills | Status: DC
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
## 2018-11-19 ENCOUNTER — Ambulatory Visit (HOSPITAL_COMMUNITY): Payer: Medicaid Other | Admitting: Psychology

## 2018-11-29 ENCOUNTER — Ambulatory Visit: Payer: Self-pay | Admitting: Psychiatry

## 2018-12-04 ENCOUNTER — Telehealth (HOSPITAL_COMMUNITY): Payer: Self-pay

## 2018-12-04 ENCOUNTER — Ambulatory Visit (INDEPENDENT_AMBULATORY_CARE_PROVIDER_SITE_OTHER): Payer: Medicaid Other | Admitting: Psychology

## 2018-12-04 DIAGNOSIS — F4310 Post-traumatic stress disorder, unspecified: Secondary | ICD-10-CM | POA: Diagnosis not present

## 2018-12-04 DIAGNOSIS — F411 Generalized anxiety disorder: Secondary | ICD-10-CM | POA: Diagnosis not present

## 2018-12-04 NOTE — Telephone Encounter (Signed)
Patient came to see me after her therapy appointment, she states that the Remeron is helping her sleep, but she is waking up very drowsy and it is hard to start the day. I advised patient to try taking half a tablet (7.5 mg) for a few nights and see if that helps. Patient will call back next week to let me know how she is doing.

## 2018-12-04 NOTE — Progress Notes (Signed)
   THERAPIST PROGRESS NOTE  Session Time: 12.30pm-1.14pm  Participation Level: Active  Behavioral Response: Well GroomedAlertanxious  Type of Therapy: Individual Therapy  Treatment Goals addressed: Diagnosis: GAD, Ptsd and goal 1.  Interventions: CBT and Other: relaxation training  Summary: Jennifer Jones is a 43 y.o. female who presents with affect anxious.  Pt reported that she had been fairly well, recently over past several days increased anxiety. Pt was able to report on some recent stressors that may be impacting- having visitors over- preparing for, today having last minute visitors husband brought over and no sure why anxious about her bestfriend's in laws visiting. Pt was able to gain awareness that normal routine and availability may be different w/ in laws visiting. Pt discussed how she would like to be able to express feelings to husband- but doesn't, doesn't want conflict.  Pt reports she attempted to express this morning and he responded that he was exaggerating.  Pt was able to  Practice grounding relaxation in session and reported feeling calm now.  Pt discussed difficulty trying on her own as mind is so worked up can't focus on practice.  Pt receptive to listening to a guided practice and will attempt this..   Suicidal/Homicidal: Nowithout intent/plan  Therapist Response: Assessed pt current functioning per pt report. Processed w/pt coping w/ recent increased anxiety and assisted in awareness of contributing factors.  Explored w/pt use of relaxation practice and led pt through mindful breathing- grounding practice.  Discussed ways to attempt on her own and guided practices.  Plan: Return again in 2 weeks.  Diagnosis: GAD, PtSD   Rainn Bullinger, LPC 12/04/2018

## 2018-12-04 NOTE — Telephone Encounter (Signed)
Good call! If 7.5 mg is not better tolerated she will need to discontinue it and we shall look for an alternative.

## 2018-12-08 ENCOUNTER — Ambulatory Visit (HOSPITAL_COMMUNITY): Payer: Medicaid Other | Admitting: Psychiatry

## 2018-12-18 ENCOUNTER — Ambulatory Visit (HOSPITAL_COMMUNITY): Payer: Medicaid Other | Admitting: Psychology

## 2018-12-23 ENCOUNTER — Ambulatory Visit (INDEPENDENT_AMBULATORY_CARE_PROVIDER_SITE_OTHER): Payer: Medicaid Other | Admitting: Psychiatry

## 2018-12-23 VITALS — BP 108/71 | HR 82 | Ht 63.0 in | Wt 163.8 lb

## 2018-12-23 DIAGNOSIS — F4310 Post-traumatic stress disorder, unspecified: Secondary | ICD-10-CM

## 2018-12-23 DIAGNOSIS — F411 Generalized anxiety disorder: Secondary | ICD-10-CM | POA: Diagnosis not present

## 2018-12-23 MED ORDER — ESCITALOPRAM OXALATE 10 MG PO TABS: 10.0000 mg | ORAL_TABLET | Freq: Every day | ORAL | 0 refills | Status: DC

## 2018-12-23 MED ORDER — SUVOREXANT 10 MG PO TABS: 10.0000 mg | ORAL_TABLET | Freq: Every evening | ORAL | 1 refills | Status: AC | PRN

## 2018-12-23 MED ORDER — ESCITALOPRAM OXALATE 10 MG PO TABS: 10.0000 mg | ORAL_TABLET | Freq: Every day | ORAL | 1 refills | Status: DC

## 2018-12-23 NOTE — Progress Notes (Signed)
BH MD/PA/NP OP Progress Note  12/23/2018 2:27 PM Jennifer Jones  MRN:  [id number]  Chief Complaint: Anxiety HPI: 43 yo woman with hx of GAD and sx of PTSD since traumatic event in January 2019. She benefited from Prozac (and earlier Zoloft, Lexapro) but stopped as she admittedly does not like to take "too many meds". Her anxiety returned and worsened to the point that she had to quit working. She denies sx of depression, denies having suicidal thoughts. She started individual counseling and feels ready to resume taking medications. She is quite concerned about rist of becoming addicted to medications and that they may make her too drowsy during the day. We have tried adding 15 mg of mirtazapine for anxiety/insomnia at bedtime. It made her too drowsy next day so I advised her to take half a tablet. Still she reports sleeping in excess of 12 hours. It is also not likely at this low dose to provide any anxiolytic effect.   Visit Diagnosis:    ICD-10-CM   1. Generalized anxiety disorder F41.1   2. PTSD (post-traumatic stress disorder) F43.10     Past Psychiatric History: Please see initial H&P.  Past Medical History:  Past Medical History:  Diagnosis Date  . Anxiety   . Bulging lumbar disc   . Diabetes mellitus, type II ([place])   . Fatty liver   . Fibrocystic breast 03/2007  . 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 H/O varicella   . H/O: menorrhagia 2011  . Hx gestational diabetes 2002 and 2005  . Hx of candidal vulvovaginitis   . Hx: UTI (urinary tract infection)   . Hyperlipemia   . Sciatica   . Vitamin D deficiency     Past Surgical History:  Procedure Laterality Date  . CESAREAN SECTION  98.02, 05    x 3  . COSMETIC SURGERY  2019    Family Psychiatric History: reviewed  Family History:  Family History  Problem Relation Age of Onset  . Hypertension Mother   . Heart disease Mother   . Hyperlipidemia Mother   . Depression Father   . Hyperlipidemia Sister   . Depression  Sister   . Hyperlipidemia Brother   . Anxiety disorder Son   . Hyperlipidemia Sister   . Depression Sister   . Hypertension Maternal Grandfather   . Cancer Maternal Grandmother     Social History:  Social History   Socioeconomic History  . Marital status: Legally Separated    Spouse name: Not on file  . Number of children: 3  . Years of education: bachelor + 2  . Highest education level: Not on file  Occupational History  . 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 Financial resource strain: Not on file  . Food insecurity:    Worry: Not on file    Inability: Not on file  . Transportation needs:    Medical: Not on file    Non-medical: Not on file  Tobacco Use  . Smoking status: Never Smoker  . Smokeless tobacco: Never Used  Substance and Sexual Activity  . Alcohol use: Never    Frequency: Never  . Drug use: Never  . Sexual activity: Yes    Birth control/protection: I.U.D.    Comment: MIRENA placed in 2012   Lifestyle  . Physical activity:    Days per week: Not on file    Minutes per session: Not on file  . Stress: Not on  file  Relationships  . Social connections:    Talks on phone: Not on file    Gets together: Not on file    Attends religious service: Not on file    Active member of club or organization: Not on file    Attends meetings of clubs or organizations: Not on file    Relationship status: Not on file  Other Topics Concern  . Not on file  Social History Narrative   Lives with 3 sons.    Mom visits from [place].    Caffeine- coffee, 1 daily    Allergies:  Allergies  Allergen Reactions  . Codeine Itching  . Darvocet [Propoxyphene N-Acetaminophen] Itching  . Dilaudid [Hydromorphone Hcl] Itching  . Percocet [Oxycodone-Acetaminophen] Itching  . Triamcinolone Rash    Cream caused rash    Metabolic Disorder Labs: Lab Results  Component Value Date   HGBA1C 6.0 12/24/2016   No results found for: PROLACTIN Lab  Results  Component Value Date   CHOL 155 12/24/2016   TRIG 107 12/24/2016   HDL 36 (L) 12/24/2016   CHOLHDL 4.3 12/24/2016   VLDL 21 12/24/2016   LDLCALC 98 12/24/2016   LDLCALC 100 12/29/2015   Lab Results  Component Value Date   TSH 0.54 12/24/2016   TSH 0.81 02/16/2016    Therapeutic Level Labs: No results found for: LITHIUM No results found for: VALPROATE No components found for:  CBMZ  Current Medications: Current Outpatient Medications  Medication Sig Dispense Refill  . Blood Glucose Monitoring Suppl (TRUE METRIX METER) W/DEVICE KIT Used as instructed 1 kit 0  . cholecalciferol (VITAMIN D) 1000 UNITS tablet Take 1,000 Units by mouth daily.     Marland Kitchen escitalopram (LEXAPRO) 10 MG tablet Take 1 tablet (10 mg total) by mouth daily for 30 days. 30 tablet 1  . glucose blood (TRUE METRIX BLOOD GLUCOSE TEST) test strip Use as instructed 100 each 12  . levonorgestrel (MIRENA) 20 MCG/24HR IUD 1 each by Intrauterine route once.    . Melatonin 5 MG TABS Take by mouth.    . metFORMIN (GLUCOPHAGE) 1000 MG tablet Take 1 tablet (1,000 mg total) by mouth 2 (two) times daily with a meal. 60 tablet 11  . naproxen (NAPROSYN) 500 MG tablet Take 1 tablet (500 mg total) by mouth 2 (two) times daily with a meal. 60 tablet 1  . simvastatin (ZOCOR) 10 MG tablet Take 1 tablet (10 mg total) by mouth at bedtime. 30 tablet 11  . Suvorexant (BELSOMRA) 10 MG TABS Take 10 mg by mouth at bedtime as needed for up to 30 days (sleep). 30 tablet 1  . traMADol (ULTRAM) 50 MG tablet Take 1 tablet (50 mg total) by mouth 2 (two) times daily. 60 tablet 5  . TRUEPLUS LANCETS 28G MISC Use as directed 100 each 12   No current facility-administered medications for this visit.      Musculoskeletal: Strength & Muscle Tone: within normal limits Gait & Station: normal Patient leans: N/A  Psychiatric Specialty Exam: Review of Systems  Constitutional: Negative.   HENT: Negative.   Eyes: Negative.   Respiratory:  Negative.   Cardiovascular: Negative.   Gastrointestinal: Negative.   Genitourinary: Negative.   Musculoskeletal: Negative.   Skin: Negative.   Neurological: Negative.   Endo/Heme/Allergies: Negative.   Psychiatric/Behavioral: The patient is nervous/anxious.     Blood pressure 108/71, pulse 82, height 5' 3"  (1.6 m), weight 163 lb 12.8 oz (74.3 kg).Body mass index is 29.02 kg/m.  General Appearance:  Well Groomed  Eye Contact:  Good  Speech:  Clear and Coherent  Volume:  Normal  Mood:  Anxious  Affect:  Full Range  Thought Process:  Goal Directed  Orientation:  Full (Time, Place, and Person)  Thought Content: Logical   Suicidal Thoughts:  No  Homicidal Thoughts:  No  Memory:  Immediate;   Good Recent;   Good Remote;   Good  Judgement:  Fair  Insight:  Fair  Psychomotor Activity:  Normal  Concentration:  Concentration: Good  Recall:  Good  Fund of Knowledge: Good  Language: Good  Akathisia:  Negative  Handed:  Right  AIMS (if indicated): not done  Assets:  Communication Skills Desire for Improvement Housing Resilience  ADL's:  Intact  Cognition: WNL  Sleep:  excessive   Screenings: GAD-7     Office Visit from 08/01/2018 in [place] for [facility] Office Visit from 01/29/2017 in [place] Office Visit from 12/24/2016 in [place] Office Visit from 08/31/2016 in [place] Office Visit from 12/05/2015 in [place]  Total GAD-7 Score  7  0  0  0  17    PHQ2-9     Office Visit from 08/01/2018 in [place] for [facility] Visit from 01/29/2017 in [place] Office Visit from 12/24/2016 in [place] Office Visit from 08/31/2016 in [place]  PHQ-2 Total Score  0  0  0  0   0  PHQ-9 Total Score  2  -  2  0  0       Assessment and Plan: 43 yo female with GAD, chronic PTSD who previously has been seeing Dr. Daron Offer. She has been off meds for some time but anxiety and insomnia returned. She did well on SSRIs but wanted to try a different medication. We have tried adding 15 mg of mirtazapine for anxiety/insomnia at bedtime. It made her too drowsy next day so I advised her to take half a tablet. Still she reports sleeping in excess of 12 hours. It is also not likely at this low dose to provide any anxiolytic effect.   Plan: DC Remeron. Trial of suvorexant (Belsomra) 10 mg at HS prn sleep (she can try taking 5 or 15 mg if 10 mg is either too or not enough sedating. She is very concerned about addiction potential of medications! This time she would like to go back on one of the medications she took in the past for anxiety - chose escitalopram. We will start at 10 mg daily. Patient will return to clinic in 6 weeks.    Stephanie Acre, MD 12/23/2018, 2:27 PM

## 2019-05-02 ENCOUNTER — Other Ambulatory Visit: Payer: Self-pay | Admitting: Physician Assistant

## 2019-05-02 DIAGNOSIS — R109 Unspecified abdominal pain: Secondary | ICD-10-CM

## 2019-05-15 ENCOUNTER — Ambulatory Visit
Admission: RE | Admit: 2019-05-15 | Discharge: 2019-05-15 | Disposition: A | Discharge status: Home / Self Care | Payer: Medicaid Other | Source: Ambulatory Visit | Attending: Physician Assistant | Admitting: Physician Assistant

## 2019-05-15 DIAGNOSIS — R109 Unspecified abdominal pain: Secondary | ICD-10-CM

## 2019-06-16 ENCOUNTER — Other Ambulatory Visit (HOSPITAL_COMMUNITY): Payer: Self-pay | Admitting: Gastroenterology

## 2019-06-16 ENCOUNTER — Other Ambulatory Visit: Payer: Self-pay | Admitting: Gastroenterology

## 2019-06-16 DIAGNOSIS — R1011 Right upper quadrant pain: Secondary | ICD-10-CM

## 2019-06-24 ENCOUNTER — Encounter (HOSPITAL_COMMUNITY)
Admission: RE | Admit: 2019-06-24 | Discharge: 2019-06-24 | Disposition: A | Discharge status: Home / Self Care | Payer: Medicaid Other | Source: Ambulatory Visit | Attending: Gastroenterology | Admitting: Gastroenterology

## 2019-06-24 ENCOUNTER — Other Ambulatory Visit: Payer: Self-pay

## 2019-06-24 DIAGNOSIS — R1011 Right upper quadrant pain: Secondary | ICD-10-CM

## 2019-06-24 MED ORDER — TECHNETIUM TC 99M MEBROFENIN IV KIT
5.1000 | PACK | Freq: Once | INTRAVENOUS | Status: AC | PRN
  Administered 2019-06-24: 5.1 via INTRAVENOUS

## 2019-07-02 ENCOUNTER — Encounter (HOSPITAL_COMMUNITY): Payer: Self-pay | Admitting: Psychology

## 2019-07-02 NOTE — Progress Notes (Signed)
Jennifer Jones is a 43 y.o. female patient who is discharged from counseling as no longer active.  Pt last seen 11/2018 and cancelled f/u.  Outpatient Therapist Discharge Summary  Goldie Stemper    [birth year]   Admission Date: 03/04/17   Discharge Date:  07/02/19 Reason for Discharge:  Not active w/ f/u Diagnosis:  GAD Comments:  Pt hasn't returned as for counseling.  Pt doesn't appear to be active w/ psychiatrist at this time either.  Jenne Campus, [facility]

## 2019-09-05 ENCOUNTER — Ambulatory Visit: Payer: Self-pay | Admitting: Internal Medicine

## 2019-09-05 ENCOUNTER — Other Ambulatory Visit: Payer: Self-pay

## 2019-09-05 DIAGNOSIS — K760 Fatty (change of) liver, not elsewhere classified: Secondary | ICD-10-CM

## 2019-09-05 DIAGNOSIS — G8929 Other chronic pain: Secondary | ICD-10-CM

## 2019-09-05 DIAGNOSIS — R7303 Prediabetes: Secondary | ICD-10-CM

## 2019-09-05 NOTE — Progress Notes (Signed)
Established Patient Office Visit  Subjective:  Patient ID: Jennifer Jones, female    DOB: 05-Jul-1976  Age: 43 y.o. MRN: [id number]  CC: No chief complaint on file. F/u visit  HPI [name] presents for f/u diabetes and concerned about her fatty liver. Admits to poor compliance with Metformin due to upset abdominal. C/o nocturnal pruritus and is worried about biliary cirrhosis that purportedly  runs in her family. C/o chronic back pain and would like to resume Tramadol.  Past Medical History:  Diagnosis Date  . Anxiety   . Bulging lumbar disc   . Diabetes mellitus, type II ([place])   . Fatty liver   . Fibrocystic breast 03/2007  . 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 H/O varicella   . H/O: menorrhagia 2011  . Hx gestational diabetes 2002 and 2005  . Hx of candidal vulvovaginitis   . Hx: UTI (urinary tract infection)   . Hyperlipemia   . Sciatica   . Vitamin D deficiency     Past Surgical History:  Procedure Laterality Date  . CESAREAN SECTION  98.02, 05    x 3  . COSMETIC SURGERY  2019    Family History  Problem Relation Age of Onset  . Hypertension Mother   . Heart disease Mother   . Hyperlipidemia Mother   . Depression Father   . Hyperlipidemia Sister   . Depression Sister   . Hyperlipidemia Brother   . Anxiety disorder Son   . Hyperlipidemia Sister   . Depression Sister   . Hypertension Maternal Grandfather   . Cancer Maternal Grandmother     Social History   Socioeconomic History  . Marital status: Legally Separated    Spouse name: Not on file  . Number of children: 3  . Years of education: bachelor + 2  . Highest education level: Not on file  Occupational History  . 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 Financial resource strain: Not on file  . Food insecurity    Worry: Not on file    Inability: Not on file  . Transportation needs    Medical: Not on file    Non-medical: Not on file  Tobacco Use  .  Smoking status: Never Smoker  . Smokeless tobacco: Never Used  Substance and Sexual Activity  . Alcohol use: Never    Frequency: Never  . Drug use: Never  . Sexual activity: Yes    Birth control/protection: I.U.D.    Comment: MIRENA placed in 2012   Lifestyle  . Physical activity    Days per week: Not on file    Minutes per session: Not on file  . Stress: Not on file  Relationships  . Social Herbalist on phone: Not on file    Gets together: Not on file    Attends religious service: Not on file    Active member of club or organization: Not on file    Attends meetings of clubs or organizations: Not on file    Relationship status: Not on file  . Intimate partner violence    Fear of current or ex partner: Not on file    Emotionally abused: Not on file    Physically abused: Not on file    Forced sexual activity: Not on file  Other Topics Concern  . Not on file  Social History Narrative   Lives with 3 sons.    Mom visits  from [place].    Caffeine- coffee, 1 daily    Outpatient Medications Prior to Visit  Medication Sig Dispense Refill  . Blood Glucose Monitoring Suppl (TRUE METRIX METER) W/DEVICE KIT Used as instructed 1 kit 0  . cholecalciferol (VITAMIN D) 1000 UNITS tablet Take 1,000 Units by mouth daily.     Marland Kitchen escitalopram (LEXAPRO) 10 MG tablet Take 1 tablet (10 mg total) by mouth daily for 30 days. 30 tablet 1  . glucose blood (TRUE METRIX BLOOD GLUCOSE TEST) test strip Use as instructed 100 each 12  . levonorgestrel (MIRENA) 20 MCG/24HR IUD 1 each by Intrauterine route once.    . Melatonin 5 MG TABS Take by mouth.    . metFORMIN (GLUCOPHAGE) 1000 MG tablet Take 1 tablet (1,000 mg total) by mouth 2 (two) times daily with a meal. 60 tablet 11  . naproxen (NAPROSYN) 500 MG tablet Take 1 tablet (500 mg total) by mouth 2 (two) times daily with a meal. 60 tablet 1  . simvastatin (ZOCOR) 10 MG tablet Take 1 tablet (10 mg total) by mouth at bedtime. 30 tablet 11  .  traMADol (ULTRAM) 50 MG tablet Take 1 tablet (50 mg total) by mouth 2 (two) times daily. 60 tablet 5  . TRUEPLUS LANCETS 28G MISC Use as directed 100 each 12   No facility-administered medications prior to visit.     Allergies  Allergen Reactions  . Codeine Itching  . Darvocet [Propoxyphene N-Acetaminophen] Itching  . Dilaudid [Hydromorphone Hcl] Itching  . Percocet [Oxycodone-Acetaminophen] Itching  . Triamcinolone Rash    Cream caused rash    ROS Review of Systems    Objective:    Physical Exam  There were no vitals taken for this visit. Wt Readings from Last 3 Encounters:  09/05/18 161 lb (73 kg)  08/01/18 164 lb (74.4 kg)  10/21/17 168 lb (76.2 kg)     Health Maintenance Due  Topic Date Due  . URINE MICROALBUMIN  04/19/2017  . INFLUENZA VACCINE  05/16/2019    There are no preventive care reminders to display for this patient.  Lab Results  Component Value Date   TSH 0.54 12/24/2016   Lab Results  Component Value Date   WBC 10.3 05/09/2016   HGB 13.2 05/09/2016   HCT 39.4 05/09/2016   MCV 87.0 05/09/2016   PLT 243 05/09/2016   Lab Results  Component Value Date   NA 139 12/29/2015   K 4.0 12/29/2015   CO2 27 12/29/2015   GLUCOSE 92 12/29/2015   BUN 12 12/29/2015   CREATININE 0.62 12/29/2015   BILITOT 0.8 12/29/2015   ALKPHOS 49 12/29/2015   AST 18 12/29/2015   ALT 18 12/29/2015   PROT 7.7 12/29/2015   ALBUMIN 4.7 12/29/2015   CALCIUM 9.6 12/29/2015   Lab Results  Component Value Date   CHOL 155 12/24/2016   Lab Results  Component Value Date   HDL 36 (L) 12/24/2016   Lab Results  Component Value Date   LDLCALC 98 12/24/2016   Lab Results  Component Value Date   TRIG 107 12/24/2016   Lab Results  Component Value Date   CHOLHDL 4.3 12/24/2016   Lab Results  Component Value Date   HGBA1C 6.0 12/24/2016      Assessment & Plan:   Problem List Items Addressed This Visit    None      No orders of the defined types were  placed in this encounter.   Follow-up:  F/u 3 months,  pain management referral  Volanda Napoleon, MD

## 2019-09-17 ENCOUNTER — Other Ambulatory Visit: Payer: Self-pay | Admitting: Internal Medicine

## 2019-09-18 LAB — LIPID PANEL W/O CHOL/HDL RATIO
Cholesterol, Total: 168 mg/dL (ref 100–199)
HDL: 47 mg/dL (ref >39)
LDL Chol Calc (NIH): 108 mg/dL — ABNORMAL HIGH (ref 0–99)
Triglycerides: 69 mg/dL (ref 0–149)
VLDL Cholesterol Cal: 13 mg/dL (ref 5–40)

## 2019-09-18 LAB — COMPREHENSIVE METABOLIC PANEL
ALT: 12 IU/L (ref 0–32)
AST: 11 IU/L (ref 0–40)
Albumin/Globulin Ratio: 2.4 — ABNORMAL HIGH (ref 1.2–2.2)
Albumin: 4.8 g/dL (ref 3.8–4.8)
Alkaline Phosphatase: 59 IU/L (ref 39–117)
BUN/Creatinine Ratio: 22 (ref 9–23)
BUN: 12 mg/dL (ref 6–24)
Bilirubin Total: 0.5 mg/dL (ref 0.0–1.2)
CO2: 20 mmol/L (ref 20–29)
Calcium: 9.6 mg/dL (ref 8.7–10.2)
Chloride: 105 mmol/L (ref 96–106)
Creatinine, Ser: 0.54 mg/dL — ABNORMAL LOW (ref 0.57–1.00)
GFR calc Af Amer: 132 mL/min/{1.73_m2} (ref >59)
GFR calc non Af Amer: 114 mL/min/{1.73_m2} (ref >59)
Globulin, Total: 2 g/dL (ref 1.5–4.5)
Glucose: 113 mg/dL — ABNORMAL HIGH (ref 65–99)
Potassium: 4.5 mmol/L (ref 3.5–5.2)
Sodium: 142 mmol/L (ref 134–144)
Total Protein: 6.8 g/dL (ref 6.0–8.5)

## 2019-09-18 LAB — GAMMA GT: GGT: 14 IU/L (ref 0–60)

## 2019-09-18 LAB — VITAMIN D 25 HYDROXY (VIT D DEFICIENCY, FRACTURES): Vit D, 25-Hydroxy: 42.2 ng/mL (ref 30.0–100.0)

## 2019-09-18 LAB — HGB A1C W/O EAG: Hgb A1c MFr Bld: 5.4 % (ref 4.8–5.6)

## 2019-10-03 ENCOUNTER — Ambulatory Visit: Payer: Self-pay | Admitting: Cardiovascular Disease

## 2019-10-03 ENCOUNTER — Other Ambulatory Visit: Payer: Self-pay

## 2019-10-03 DIAGNOSIS — K760 Fatty (change of) liver, not elsewhere classified: Secondary | ICD-10-CM

## 2019-10-03 NOTE — Progress Notes (Signed)
[facility]  Virtual Visit via Telephone Note   No physical exam could be performed with this format.    Date:  10/03/2019   ID:  Jennifer Jones, DOB 11-11-1975, MRN [id number]  Patient Location: Home Provider Location: Home  PCP:  [facility] [place]  Cardiologist:  No primary care provider on file.  Electrophysiologist:  None   Evaluation Performed:  Follow-Up Visit  Chief Complaint: Follow-up visit to review labs.  History of Present Illness:    Jennifer Jones is a 43 y.o. female who was reached via phone for a follow-up visit to go over her recent labs. She has known history of diabetes.  She was seen last month for concerns about possible fatty liver.  She complained of nocturnal pruritus and was worried about biliary cirrhosis due to her family history.  She was also referred for pain management referral. She underwent routine labs which showed normal CMP.  Lipid profile showed only mild hyperlipidemia with an LDL of 108.  Hemoglobin A1c was 5.4 with normal vitamin D and gamma GT.    Past Medical History:  Diagnosis Date  . Anxiety   . Bulging lumbar disc   . Diabetes mellitus, type II ([place])   . Fatty liver   . Fibrocystic breast 03/2007  . 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 H/O varicella   . H/O: menorrhagia 2011  . Hx gestational diabetes 2002 and 2005  . Hx of candidal vulvovaginitis   . Hx: UTI (urinary tract infection)   . Hyperlipemia   . Sciatica   . Vitamin D deficiency    Past Surgical History:  Procedure Laterality Date  . CESAREAN SECTION  98.02, 05    x 3  . COSMETIC SURGERY  2019     No outpatient medications have been marked as taking for the 10/03/19 encounter (Appointment) with Wellington Hampshire, MD.     Allergies:   Codeine, Darvocet [propoxyphene n-acetaminophen], Dilaudid [hydromorphone hcl], Percocet [oxycodone-acetaminophen], and Triamcinolone   Social History   Tobacco Use  . Smoking status:  Never Smoker  . Smokeless tobacco: Never Used  Substance Use Topics  . Alcohol use: Never  . Drug use: Never     Family Hx: The patient's family history includes Anxiety disorder in her son; Cancer in her maternal grandmother; Depression in her father, sister, and sister; Heart disease in her mother; Hyperlipidemia in her brother, mother, sister, and sister; Hypertension in her maternal grandfather and mother.  ROS:   Please see the history of present illness.     All other systems reviewed and are negative.   Prior CV studies:   The following studies were reviewed today:    Labs/Other Tests and Data Reviewed:    EKG:  No ECG reviewed.  Recent Labs: 09/17/2019: ALT 12; BUN 12; Creatinine, Ser 0.54; Potassium 4.5; Sodium 142   Recent Lipid Panel Lab Results  Component Value Date/Time   CHOL 168 09/17/2019 10:26 AM   TRIG 69 09/17/2019 10:26 AM   HDL 47 09/17/2019 10:26 AM   CHOLHDL 4.3 12/24/2016 11:14 AM   LDLCALC 108 (H) 09/17/2019 10:26 AM    Wt Readings from Last 3 Encounters:  09/05/18 161 lb (73 kg)  08/01/18 164 lb (74.4 kg)  10/21/17 168 lb (76.2 kg)     Objective:    Vital Signs:  There were no vitals taken for this visit.   VITAL SIGNS:  reviewed  ASSESSMENT & PLAN:    1.  Fatty liver: This was noted on previous ultrasound.  However, all her recent labs were normal and I explained to her that right-sided pain and pruritus is not due to a liver condition.  She had hepatocellular scan in September which was also unremarkable. 2. Diabetes mellitus: Most recent hemoglobin A1c was 5.4. 3. Hyperlipidemia: Her LDL was only 108.    Time:   Today, I have spent 10 minutes with the patient with telehealth technology discussing the above problems.     Medication Adjustments/Labs and Tests Ordered: Current medicines are reviewed at length with the patient today.  Concerns regarding medicines are outlined above.   Tests Ordered: No orders of the defined  types were placed in this encounter.   Medication Changes: No orders of the defined types were placed in this encounter.   Follow Up: in 6 months  Signed, Kathlyn Sacramento, MD  10/03/2019 10:22 AM

## 2019-10-22 ENCOUNTER — Encounter: Payer: Medicaid Other | Admitting: Physical Medicine & Rehabilitation

## 2019-10-22 ENCOUNTER — Telehealth: Payer: Self-pay | Admitting: *Deleted

## 2019-10-22 ENCOUNTER — Other Ambulatory Visit: Payer: Self-pay

## 2019-10-22 MED ORDER — TRAMADOL HCL 50 MG PO TABS: 50.0000 mg | ORAL_TABLET | Freq: Two times a day (BID) | ORAL | 0 refills | Status: AC

## 2019-10-22 NOTE — Telephone Encounter (Signed)
Ms Jennifer Jones was scheduled for a web ex today because she woke up with runny nose and could not come in to the office.  Being that we have not seen her since 11/2017 and she is experiencing back pain like before and should be evaluated. We have canceled her webex today and rescheduled her for Friday 10/30/19 @1 :45. She asked if she could have some tramadol until then.  Per Dr Letta Pate I will call in a 1 week supply (1 po bid #14 of 50 mg tramadol). She is in agreement.

## 2019-10-24 ENCOUNTER — Encounter (HOSPITAL_COMMUNITY): Payer: Self-pay | Admitting: Emergency Medicine

## 2019-10-24 ENCOUNTER — Other Ambulatory Visit: Payer: Self-pay

## 2019-10-24 ENCOUNTER — Emergency Department (HOSPITAL_COMMUNITY)
Admission: EM | Admit: 2019-10-24 | Discharge: 2019-10-24 | Disposition: A | Discharge status: Home / Self Care | Payer: Medicaid Other | Attending: Emergency Medicine | Admitting: Emergency Medicine

## 2019-10-24 DIAGNOSIS — K0889 Other specified disorders of teeth and supporting structures: Secondary | ICD-10-CM | POA: Diagnosis present

## 2019-10-24 DIAGNOSIS — Z79899 Other long term (current) drug therapy: Secondary | ICD-10-CM | POA: Diagnosis not present

## 2019-10-24 DIAGNOSIS — K029 Dental caries, unspecified: Secondary | ICD-10-CM | POA: Diagnosis not present

## 2019-10-24 MED ORDER — KETOROLAC TROMETHAMINE 60 MG/2ML IM SOLN
60.0000 mg | Freq: Once | INTRAMUSCULAR | Status: AC
  Administered 2019-10-24: 60 mg via INTRAMUSCULAR
  Filled 2019-10-24: qty 2

## 2019-10-24 MED ORDER — PENICILLIN V POTASSIUM 500 MG PO TABS: 500.0000 mg | ORAL_TABLET | Freq: Four times a day (QID) | ORAL | 0 refills | Status: AC

## 2019-10-24 MED ORDER — PENICILLIN V POTASSIUM 500 MG PO TABS
500.0000 mg | ORAL_TABLET | Freq: Once | ORAL | Status: AC
  Administered 2019-10-24: 500 mg via ORAL
  Filled 2019-10-24: qty 1

## 2019-10-24 MED ORDER — NAPROXEN 500 MG PO TABS: 500.0000 mg | ORAL_TABLET | Freq: Two times a day (BID) | ORAL | 0 refills | Status: AC

## 2019-10-24 NOTE — ED Provider Notes (Signed)
[facility] DEPT Provider Note   CSN: [id number] Arrival date & time: 10/24/19  0240     History Chief Complaint  Patient presents with  . Dental Pain    Jennifer Jones is a 44 y.o. female.  The history is provided by the patient.  Dental Pain Location:  Upper Upper teeth location:  2/RU 2nd molar and 3/RU 1st molar Quality:  Aching Severity:  Severe Onset quality:  Gradual Duration:  2 days Timing:  Constant Progression:  Worsening Chronicity:  New Context: dental caries and poor dentition   Previous work-up:  Filled cavity Relieved by:  Nothing Worsened by:  Nothing Ineffective treatments: tramadol and ibuprofen. Associated symptoms: no difficulty swallowing, no drooling, no fever, no gum swelling, no neck swelling and no oral bleeding   Risk factors: no alcohol problem   Patient reports right cheek is swollen.  No f/c/r.  No trismus.       Past Medical History:  Diagnosis Date  . Anxiety   . Bulging lumbar disc   . Diabetes mellitus, type II ([place])   . Fatty liver   . Fibrocystic breast 03/2007  . 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 H/O varicella   . H/O: menorrhagia 2011  . Hx gestational diabetes 2002 and 2005  . Hx of candidal vulvovaginitis   . Hx: UTI (urinary tract infection)   . Hyperlipemia   . Sciatica   . Vitamin D deficiency     Patient Active Problem List   Diagnosis Date Noted  . PTSD (post-traumatic stress disorder) 12/23/2018  . Generalized anxiety disorder 11/10/2018  . Chronic right sacroiliac pain 05/06/2017  . 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 Chronic left sacroiliac joint pain 01/31/2017  . Lumbar degenerative disc disease 01/31/2017  . Urinary frequency 01/29/2017  . Estrogen excess 01/29/2017  . Hot flashes 12/24/2016  . Claustrophobia 12/24/2016  . Vagina itching 08/31/2016  . IUD (intrauterine device) in place 04/22/2016  . Left-sided low back pain without sciatica 04/19/2016  . External  hemorrhoids 12/05/2015  . Joint pain 07/08/2015  . Right elbow pain 12/03/2014  . Prediabetes 06/04/2014  . Hair loss 06/04/2014  . Fatty liver disease, nonalcoholic 32/20/2542  . Vitamin D deficiency 06/04/2014  . Overweight (BMI 25.0-29.9) 06/04/2014  . Right knee pain 06/04/2014  . Tension headache 06/04/2014    Past Surgical History:  Procedure Laterality Date  . CESAREAN SECTION  98.02, 05    x 3  . COSMETIC SURGERY  2019     OB History    Gravida  3   Para  3   Term  3   Preterm      AB      Living  3     SAB      TAB      Ectopic      Multiple      Live Births  2           Family History  Problem Relation Age of Onset  . Hypertension Mother   . Heart disease Mother   . Hyperlipidemia Mother   . Depression Father   . Hyperlipidemia Sister   . Depression Sister   . Hyperlipidemia Brother   . Anxiety disorder Son   . Hyperlipidemia Sister   . Depression Sister   . Hypertension Maternal Grandfather   . Cancer Maternal Grandmother     Social History   Tobacco Use  . Smoking status: Never Smoker  . Smokeless tobacco:  Never Used  Substance Use Topics  . Alcohol use: Never  . Drug use: Never    Home Medications Prior to Admission medications   Medication Sig Start Date End Date Taking? Authorizing Provider  Blood Glucose Monitoring Suppl (TRUE METRIX METER) W/DEVICE KIT Used as instructed 06/24/15   Boykin Nearing, MD  cholecalciferol (VITAMIN D) 1000 UNITS tablet Take 1,000 Units by mouth daily.     [provider]  escitalopram (LEXAPRO) 10 MG tablet Take 1 tablet (10 mg total) by mouth daily for 30 days. 12/23/18 01/22/19  Pucilowski, Olgierd A, MD  glucose blood (TRUE METRIX BLOOD GLUCOSE TEST) test strip Use as instructed 01/29/17   Boykin Nearing, MD  levonorgestrel (MIRENA) 20 MCG/24HR IUD 1 each by Intrauterine route once.    [provider]  Melatonin 5 MG TABS Take by mouth.    [provider]    metFORMIN (GLUCOPHAGE) 1000 MG tablet Take 1 tablet (1,000 mg total) by mouth 2 (two) times daily with a meal. 08/31/16   Funches, Adriana Mccallum, MD  naproxen (NAPROSYN) 500 MG tablet Take 1 tablet (500 mg total) by mouth 2 (two) times daily with a meal. 08/01/18   Lavonia Drafts, MD  naproxen (NAPROSYN) 500 MG tablet Take 1 tablet (500 mg total) by mouth 2 (two) times daily with a meal. 10/24/19   Zeenat Jeanbaptiste, MD  penicillin v potassium (VEETID) 500 MG tablet Take 1 tablet (500 mg total) by mouth 4 (four) times daily for 7 days. 10/24/19 10/31/19  Bethel Gaglio, MD  simvastatin (ZOCOR) 10 MG tablet Take 1 tablet (10 mg total) by mouth at bedtime. 08/31/16   Funches, Adriana Mccallum, MD  traMADol (ULTRAM) 50 MG tablet Take 1 tablet (50 mg total) by mouth 2 (two) times daily for 7 days. 10/22/19 10/29/19  Kirsteins, Luanna Salk, MD  TRUEPLUS LANCETS 28G MISC Use as directed 10/11/15   Boykin Nearing, MD    Allergies    Codeine, Darvocet [propoxyphene n-acetaminophen], Dilaudid [hydromorphone hcl], Percocet [oxycodone-acetaminophen], and Triamcinolone  Review of Systems   Review of Systems  Constitutional: Negative for fever.  HENT: Positive for dental problem. Negative for drooling and trouble swallowing.   Eyes: Negative for visual disturbance.  Respiratory: Negative for cough.   Cardiovascular: Negative for chest pain.  Gastrointestinal: Negative for abdominal pain.  Genitourinary: Negative for difficulty urinating.  Musculoskeletal: Negative for neck stiffness.  Neurological: Negative for weakness.  Psychiatric/Behavioral: Negative for agitation.  All other systems reviewed and are negative.   Physical Exam Updated Vital Signs BP (!) 135/97 (BP Location: Right Arm)   Pulse 74   Temp 99.3 F (37.4 C) (Oral)   Resp 16   Ht '5\' 2"'$  (1.575 m)   Wt 70.3 kg   SpO2 99%   BMI 28.35 kg/m   Physical Exam Vitals and nursing note reviewed.  Constitutional:      General: She is not in acute  distress.    Appearance: Normal appearance.  HENT:     Head: Normocephalic and atraumatic.     Nose: Nose normal.     Mouth/Throat:     Mouth: Mucous membranes are moist.     Dentition: Abnormal dentition. Dental caries present.     Pharynx: Oropharynx is clear. No uvula swelling.      Comments: No trismus, no appreciable facial swelling on exam Cardiovascular:     Rate and Rhythm: Normal rate and regular rhythm.     Pulses: Normal pulses.     Heart sounds: Normal  heart sounds.  Pulmonary:     Effort: Pulmonary effort is normal.     Breath sounds: Normal breath sounds.  Abdominal:     General: Abdomen is flat. Bowel sounds are normal.     Tenderness: There is no abdominal tenderness.  Musculoskeletal:        General: Normal range of motion.  Skin:    General: Skin is warm and dry.     Capillary Refill: Capillary refill takes less than 2 seconds.  Neurological:     General: No focal deficit present.     Mental Status: She is alert and oriented to person, place, and time.  Psychiatric:        Mood and Affect: Mood normal.        Behavior: Behavior normal.     ED Results / Procedures / Treatments   Labs (all labs ordered are listed, but only abnormal results are displayed) Labs Reviewed - No data to display  EKG None  Radiology No results found.  Procedures Procedures (including critical care time)  Medications Ordered in ED Medications  ketorolac (TORADOL) injection 60 mg (has no administration in time range)  penicillin v potassium (VEETID) tablet 500 mg (has no administration in time range)    ED Course  I have reviewed the triage vital signs and the nursing notes.  Pertinent labs & imaging results that were available during my care of the patient were reviewed by me and considered in my medical decision making (see chart for details).    There is no swelling on exam.  Patient is taking tramadol.  Will add naproxen and PCN.  Patient is instructed to follow  up with dentistry in order to have cavities evaluated and treated.    Krysten Veronica was evaluated in Emergency Department on 10/24/2019 for the symptoms described in the history of present illness. She was evaluated in the context of the global COVID-19 pandemic, which necessitated consideration that the patient might be at risk for infection with the SARS-CoV-2 virus that causes COVID-19. Institutional protocols and algorithms that pertain to the evaluation of patients at risk for COVID-19 are in a state of rapid change based on information released by regulatory bodies including the CDC and federal and state organizations. These policies and algorithms were followed during the patient's care in the ED.  Final Clinical Impression(s) / ED Diagnoses Final diagnoses:  Pain, dental  Pain due to dental caries   Return for intractable cough, coughing up blood,fevers >100.4 unrelieved by medication, shortness of breath, intractable vomiting, chest pain, shortness of breath, weakness,numbness, changes in speech, facial asymmetry,abdominal pain, passing out,Inability to tolerate liquids or food, cough, altered mental status or any concerns. No signs of systemic illness or infection. The patient is nontoxic-appearing on exam and vital signs are within normal limits.   I have reviewed the triage vital signs and the nursing notes. Pertinent labs &imaging results that were available during my care of the patient were reviewed by me and considered in my medical decision making (see chart for details).  After history, exam, and medical workup I feel the patient has been appropriately medically screened and is safe for discharge home. Pertinent diagnoses were discussed with the patient. Patient was given return Rx / DC Orders ED Discharge Orders         Ordered    naproxen (NAPROSYN) 500 MG tablet  2 times daily with meals     10/24/19 0457    penicillin v potassium (VEETID) 500  MG tablet  4 times  daily     10/24/19 0457           Shanette Tamargo, MD 10/24/19 1100

## 2019-10-24 NOTE — ED Triage Notes (Signed)
Possible dental abscess with swelling right side of face

## 2019-10-24 NOTE — ED Triage Notes (Signed)
Right upper incisor area tooth pain onset x2 days pain worse today

## 2019-10-30 ENCOUNTER — Encounter: Payer: Medicaid Other | Admitting: Physical Medicine & Rehabilitation

## 2019-11-13 ENCOUNTER — Other Ambulatory Visit: Payer: Self-pay | Admitting: Physical Medicine & Rehabilitation

## 2019-11-26 ENCOUNTER — Other Ambulatory Visit: Payer: Self-pay | Admitting: Physician Assistant

## 2019-11-26 DIAGNOSIS — R1031 Right lower quadrant pain: Secondary | ICD-10-CM

## 2019-12-05 ENCOUNTER — Ambulatory Visit: Payer: Self-pay | Admitting: Cardiovascular Disease

## 2019-12-05 ENCOUNTER — Other Ambulatory Visit: Payer: Self-pay

## 2019-12-05 DIAGNOSIS — R1011 Right upper quadrant pain: Secondary | ICD-10-CM

## 2019-12-05 NOTE — Progress Notes (Signed)
[facility]  Virtual Visit via Telephone Note   No physical exam could be performed with this format.    Date:  12/05/2019   ID:  Jennifer Jones, DOB 04-29-1976, MRN [id number]  Patient Location: Home Provider Location: Home  PCP:  [facility]  Cardiologist:  No primary care provider on file.  Electrophysiologist:  None   Evaluation Performed:  Follow-Up Visit  Chief Complaint: Right upper quadrant pain  History of Present Illness:    Jennifer Jones Patient is a 44 y.o. female who was reached via phone for a follow-up visit regarding abdominal pain She has known history of diabetes.  She is known to have fatty liver. She complained of nocturnal pruritus and was worried about biliary cirrhosis due to her family history.  She was also referred for pain management referral. She underwent routine labs which showed normal CMP.  Lipid profile showed only mild hyperlipidemia with an LDL of 108.  Hemoglobin A1c was 5.4 with normal vitamin D and gamma GT. She had COVID-19 infection recently but has almost completely recovered with no residual symptoms. She is complaining of continued right upper quadrant pain especially after she eats.  She is afraid to eat due to And she seems to be very frustrated.    Past Medical History:  Diagnosis Date  . Anxiety   . Bulging lumbar disc   . Diabetes mellitus, type II ([place])   . Fatty liver   . Fibrocystic breast 03/2007  . 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 H/O varicella   . H/O: menorrhagia 2011  . Hx gestational diabetes 2002 and 2005  . Hx of candidal vulvovaginitis   . Hx: UTI (urinary tract infection)   . Hyperlipemia   . Sciatica   . Vitamin D deficiency    Past Surgical History:  Procedure Laterality Date  . CESAREAN SECTION  98.02, 05    x 3  . COSMETIC SURGERY  2019     No outpatient medications have been marked as taking for the 12/05/19 encounter (Appointment) with Jennifer Hampshire, MD.      Allergies:   Codeine, Darvocet [propoxyphene n-acetaminophen], Dilaudid [hydromorphone hcl], Percocet [oxycodone-acetaminophen], and Triamcinolone   Social History   Tobacco Use  . Smoking status: Never Smoker  . Smokeless tobacco: Never Used  Substance Use Topics  . Alcohol use: Never  . Drug use: Never     Family Hx: The patient's family history includes Anxiety disorder in her son; Cancer in her maternal grandmother; Depression in her father, sister, and sister; Heart disease in her mother; Hyperlipidemia in her brother, mother, sister, and sister; Hypertension in her maternal grandfather and mother.  ROS:   Please see the history of present illness.     All other systems reviewed and are negative.   Prior CV studies:   The following studies were reviewed today:    Labs/Other Tests and Data Reviewed:    EKG:  No ECG reviewed.  Recent Labs: 09/17/2019: ALT 12; BUN 12; Creatinine, Ser 0.54; Potassium 4.5; Sodium 142   Recent Lipid Panel Lab Results  Component Value Date/Time   CHOL 168 09/17/2019 10:26 AM   TRIG 69 09/17/2019 10:26 AM   HDL 47 09/17/2019 10:26 AM   CHOLHDL 4.3 12/24/2016 11:14 AM   LDLCALC 108 (H) 09/17/2019 10:26 AM    Wt Readings from Last 3 Encounters:  10/24/19 155 lb (70.3 kg)  09/05/18 161 lb (73 kg)  08/01/18 164 lb (74.4 kg)  Objective:    Vital Signs:  There were no vitals taken for this visit.   VITAL SIGNS:  reviewed  ASSESSMENT & PLAN:    1. Fatty liver: This was noted on previous ultrasound.  However, all her recent labs were normal and I explained to her that right-sided pain and pruritus is not due to a liver condition.  She had hepatocellular scan in September which was also unremarkable. 2. Diabetes mellitus: Most recent hemoglobin A1c was 5.4. 3. Hyperlipidemia: Her LDL was only 108. 4. Persistent right upper quadrant abdominal pain: Mostly postprandial.  The exact etiology is not entirely clear and the patient  had work-up as outlined above.  I think the best option would be to refer her to gastroenterology for their opinion.    Time:   Today, I have spent 10 minutes with the patient with telehealth technology discussing the above problems.     Medication Adjustments/Labs and Tests Ordered: Current medicines are reviewed at length with the patient today.  Concerns regarding medicines are outlined above.   Tests Ordered: No orders of the defined types were placed in this encounter.   Medication Changes: No orders of the defined types were placed in this encounter.   Follow Up: in 6 months  Signed, Kathlyn Sacramento, MD  12/05/2019 11:14 AM

## 2019-12-12 ENCOUNTER — Ambulatory Visit: Payer: Self-pay | Admitting: Internal Medicine

## 2019-12-14 ENCOUNTER — Telehealth: Payer: Self-pay | Admitting: Physical Medicine & Rehabilitation

## 2019-12-14 NOTE — Telephone Encounter (Signed)
This patient would like to come in for an injection, She was last seen 06/24/17 for injection. Last completed visit 12/03/17. Would you like to schedule this patient for an injection?

## 2019-12-16 NOTE — Telephone Encounter (Signed)
Patient would like to come in for injection, she was last seen 12/03/17. Advise if we can schedule.

## 2019-12-16 NOTE — Telephone Encounter (Signed)
Patient needs to come an appt to be evaluated first. Not seen since 11/2017

## 2019-12-19 ENCOUNTER — Other Ambulatory Visit: Payer: Self-pay

## 2019-12-19 ENCOUNTER — Ambulatory Visit: Payer: Self-pay | Admitting: Internal Medicine

## 2019-12-19 DIAGNOSIS — J40 Bronchitis, not specified as acute or chronic: Secondary | ICD-10-CM

## 2019-12-19 MED ORDER — ALBUTEROL SULFATE HFA 108 (90 BASE) MCG/ACT IN AERS: 2.0000 | INHALATION_SPRAY | Freq: Four times a day (QID) | RESPIRATORY_TRACT | 1 refills | Status: DC | PRN

## 2019-12-19 NOTE — Progress Notes (Signed)
   Office Visit Note   Patient: Jennifer Jones           Date of Birth: 04/17/1976           MRN: [id number] Visit Date: 12/19/2019              Requested by: Cletis Athens PCP: Cletis Athens  Assessment & Plan: Visit Diagnoses: Patient had COVID infection, and she is coughing up a lot gets Shortness of breath,  Does not have any phlegm  Plan: Excerise everyday, continue taking vitamin C 500 mg a day, vitamin d 5000 units a day, Zinc 60 mg a day, gave new prescription, ventolin inhaler 1 puff 4 times a day as needed. Also gave her Tussirex half a teaspoon twice a day 120 CC. Patient will come pick up prescription in the clinic.  Follow-Up Instructions: Follow up as needed, if she gets worse she should go to the hospital Orders:  No labs  No orders of the defined types were placed in this encounter.     Procedures: None  Clinical Data: No additional findings. Not available  Subjective: Post-COVID cough  HPI  Review of Systems   Objective: Vital Signs: There were no vitals taken for this visit. Virtual Visit  Physical Exam   telephone visit  Specialty Comments:  Walk Everyday    Imaging: None

## 2019-12-22 ENCOUNTER — Other Ambulatory Visit: Payer: Self-pay | Admitting: Internal Medicine

## 2019-12-23 LAB — CBC WITH DIFFERENTIAL/PLATELET
Basophils Absolute: 0 10*3/uL (ref 0.0–0.2)
Basos: 1 %
EOS (ABSOLUTE): 0.2 10*3/uL (ref 0.0–0.4)
Eos: 2 %
Hematocrit: 44.3 % (ref 34.0–46.6)
Hemoglobin: 14.5 g/dL (ref 11.1–15.9)
Immature Grans (Abs): 0 10*3/uL (ref 0.0–0.1)
Immature Granulocytes: 0 %
Lymphocytes Absolute: 2.1 10*3/uL (ref 0.7–3.1)
Lymphs: 29 %
MCH: 29.3 pg (ref 26.6–33.0)
MCHC: 32.7 g/dL (ref 31.5–35.7)
MCV: 90 fL (ref 79–97)
Monocytes Absolute: 0.5 10*3/uL (ref 0.1–0.9)
Monocytes: 6 %
Neutrophils Absolute: 4.4 10*3/uL (ref 1.4–7.0)
Neutrophils: 62 %
Platelets: 242 10*3/uL (ref 150–450)
RBC: 4.95 x10E6/uL (ref 3.77–5.28)
RDW: 12.6 % (ref 11.7–15.4)
WBC: 7.2 10*3/uL (ref 3.4–10.8)

## 2019-12-23 LAB — COMPREHENSIVE METABOLIC PANEL
ALT: 12 IU/L (ref 0–32)
AST: 10 IU/L (ref 0–40)
Albumin/Globulin Ratio: 2 (ref 1.2–2.2)
Albumin: 4.5 g/dL (ref 3.8–4.8)
Alkaline Phosphatase: 65 IU/L (ref 39–117)
BUN/Creatinine Ratio: 23 (ref 9–23)
BUN: 15 mg/dL (ref 6–24)
Bilirubin Total: 0.7 mg/dL (ref 0.0–1.2)
CO2: 21 mmol/L (ref 20–29)
Calcium: 9.5 mg/dL (ref 8.7–10.2)
Chloride: 105 mmol/L (ref 96–106)
Creatinine, Ser: 0.66 mg/dL (ref 0.57–1.00)
GFR calc Af Amer: 123 mL/min/{1.73_m2} (ref >59)
GFR calc non Af Amer: 106 mL/min/{1.73_m2} (ref >59)
Globulin, Total: 2.3 g/dL (ref 1.5–4.5)
Glucose: 117 mg/dL — ABNORMAL HIGH (ref 65–99)
Potassium: 4 mmol/L (ref 3.5–5.2)
Sodium: 140 mmol/L (ref 134–144)
Total Protein: 6.8 g/dL (ref 6.0–8.5)

## 2019-12-23 LAB — D-DIMER, QUANTITATIVE: D-DIMER: 0.45 mg/L FEU (ref 0.00–0.49)

## 2019-12-25 ENCOUNTER — Encounter: Payer: Self-pay | Admitting: Physical Medicine & Rehabilitation

## 2019-12-25 ENCOUNTER — Other Ambulatory Visit: Payer: Self-pay

## 2019-12-25 ENCOUNTER — Encounter: Payer: Medicaid Other | Attending: Physical Medicine & Rehabilitation | Admitting: Physical Medicine & Rehabilitation

## 2019-12-25 VITALS — BP 117/77 | HR 77 | Temp 98.7°F | Ht 63.0 in | Wt 158.0 lb

## 2019-12-25 DIAGNOSIS — M533 Sacrococcygeal disorders, not elsewhere classified: Secondary | ICD-10-CM | POA: Diagnosis not present

## 2019-12-25 NOTE — Progress Notes (Signed)
Subjective:    Patient ID: Jennifer Jones, female    DOB: [birth year], 44 y.o.   MRN: [id number]  HPI 44 year old female with a primary pain complaint of bilateral buttocks pain that radiates into the posterior thigh.  She does have some lateral thigh pain as well. She gets relief with Aleve but only partially. She has no recent trauma.  No other new injuries. She did have Covid about a month ago but had mild illness. She was seen in this clinic last about 2 years ago and had very good relief after bilateral sacroiliac injection performed in September 2018. Patient feels like her current symptoms are similar to the problems that he she had a couple years ago. She has not been going to the gym because of Covid.  She is not working as well.  She plans to go back to the gym and start working again now that she has had the virus Pain Inventory Average Pain 9 Pain Right Now 4 My pain is sharp, dull and aching  In the last 24 hours, has pain interfered with the following? General activity 6 Relation with others 6 Enjoyment of life 6 What TIME of day is your pain at its worst? daytime and evening Sleep (in general) Fair  Pain is worse with: walking, standing and unsure Pain improves with: rest, heat/ice, medication and injections Relief from Meds: 9  Mobility how many minutes can you walk? 0 when have pain ability to climb steps?  yes do you drive?  yes  Function not employed: date last employed .  Neuro/Psych No problems in this area  Prior Studies Any changes since last visit?  no  Physicians involved in your care Any changes since last visit?  no   Family History  Problem Relation Age of Onset  . Hypertension Mother   . Heart disease Mother   . Hyperlipidemia Mother   . Depression Father   . Hyperlipidemia Sister   . Depression Sister   . Hyperlipidemia Brother   . Anxiety disorder Son   . Hyperlipidemia Sister   . Depression Sister   . Hypertension Maternal  Grandfather   . Cancer Maternal Grandmother    Social History   Socioeconomic History  . Marital status: Legally Separated    Spouse name: Not on file  . Number of children: 3  . Years of education: bachelor + 2  . Highest education level: Not on file  Occupational History  . 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 Smoking status: Never Smoker  . Smokeless tobacco: Never Used  Substance and Sexual Activity  . Alcohol use: Never  . Drug use: Never  . Sexual activity: Yes    Birth control/protection: I.U.D.    Comment: MIRENA placed in 2012   Other Topics Concern  . Not on file  Social History Narrative   Lives with 3 sons.    Mom visits from [place].    Caffeine- coffee, 1 daily   Social Determinants of Health   Financial Resource Strain:   . Difficulty of Paying Living Expenses:   Food Insecurity:   . Worried About Charity fundraiser in the Last Year:   . Arboriculturist in the Last Year:   Transportation Needs:   . Film/video editor (Medical):   Marland Kitchen Lack of Transportation (Non-Medical):   Physical Activity:   . Days of Exercise per Week:   . Minutes of  Exercise per Session:   Stress:   . Feeling of Stress :   Social Connections:   . Frequency of Communication with Friends and Family:   . Frequency of Social Gatherings with Friends and Family:   . Attends Religious Services:   . Active Member of Clubs or Organizations:   . Attends Archivist Meetings:   Marland Kitchen Marital Status:    Past Surgical History:  Procedure Laterality Date  . CESAREAN SECTION  98.02, 05    x 3  . COSMETIC SURGERY  2019   Past Medical History:  Diagnosis Date  . Anxiety   . Bulging lumbar disc   . Diabetes mellitus, type II ([place])   . Fatty liver   . Fibrocystic breast 03/2007  . 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 H/O varicella   . H/O: menorrhagia 2011  . Hx gestational diabetes 2002 and 2005  . Hx of candidal vulvovaginitis   . Hx: UTI  (urinary tract infection)   . Hyperlipemia   . Sciatica   . Vitamin D deficiency    BP 117/77   Pulse 77   Temp 98.7 F (37.1 C)   Ht 5\' 3"  (1.6 m)   Wt 158 lb (71.7 kg)   SpO2 96%   BMI 27.99 kg/m   Opioid Risk Score:   Fall Risk Score:  `1  Depression screen PHQ 2/9  Depression screen [facility] 2/9 08/01/2018 06/03/2017 01/29/2017 12/24/2016 08/31/2016 04/19/2016 12/05/2015  Decreased Interest 0 0 0 0 0 0 -  Down, Depressed, Hopeless 0 0 0 0 0 0 0  PHQ - 2 Score 0 0 0 0 0 0 0  Altered sleeping 2 - 0 0 0 - 0  Tired, decreased energy 0 - 1 0 0 - 1  Change in appetite 0 - 1 0 0 - 1  Feeling bad or failure about yourself  0 - 0 0 0 - 0  Trouble concentrating 0 - 0 0 0 - 0  Moving slowly or fidgety/restless 0 - 0 0 0 - 0  Suicidal thoughts 0 - 0 0 0 - 0  PHQ-9 Score 2 - 2 0 0 - 2    Review of Systems  All other systems reviewed and are negative.      Objective:   Physical Exam Constitutional:      Appearance: Normal appearance.  HENT:     Head: Normocephalic and atraumatic.  Eyes:     Extraocular Movements: Extraocular movements intact.     Conjunctiva/sclera: Conjunctivae normal.     Pupils: Pupils are equal, round, and reactive to light.  Musculoskeletal:     Comments: There is tenderness palpation bilateral PSIS area no tenderness over the lumbar paraspinals.  No tenderness over the greater trochanters of the hip. Negative straight leg raising test  Skin:    General: Skin is warm and dry.  Neurological:     General: No focal deficit present.     Mental Status: She is alert and oriented to person, place, and time.     Gait: Gait is intact.     Comments: Motor strength is 5/5 bilateral hip flexor knee extensor ankle dorsiflexor.  Psychiatric:        Mood and Affect: Mood normal.        Behavior: Behavior normal.           Assessment & Plan:  #1.  Bilateral back and buttocks pain low back and buttocks pain.  Symptoms recurrent , previously  with 24mo response to  Sacroiliac injections under fluoro guidance, will repeat so that pt can resume working out at gym.  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

## 2020-01-11 ENCOUNTER — Encounter: Payer: Self-pay | Admitting: Physical Medicine & Rehabilitation

## 2020-01-11 ENCOUNTER — Other Ambulatory Visit: Payer: Self-pay

## 2020-01-11 ENCOUNTER — Encounter: Payer: Medicaid Other | Admitting: Physical Medicine & Rehabilitation

## 2020-01-11 VITALS — BP 117/77 | HR 72 | Temp 97.9°F | Ht 63.0 in | Wt 157.0 lb

## 2020-01-11 DIAGNOSIS — M533 Sacrococcygeal disorders, not elsewhere classified: Secondary | ICD-10-CM | POA: Diagnosis not present

## 2020-01-11 NOTE — Progress Notes (Signed)
  PROCEDURE RECORD [facility]   Name: Jennifer Jones DOB:13-Nov-1975 MRN: [id number]  Date:01/11/2020  Physician: Alysia Penna, MD    Nurse/CMA: Pietro Bonura, CMA  Allergies:  Allergies  Allergen Reactions  . Codeine Itching  . Darvocet [Propoxyphene N-Acetaminophen] Itching  . Dilaudid [Hydromorphone Hcl] Itching  . Percocet [Oxycodone-Acetaminophen] Itching  . Triamcinolone Rash    Cream caused rash    Consent Signed: No.  Is patient diabetic? Yes.    CBG today? 31  Pregnant: No. LMP: No LMP recorded. (Menstrual status: IUD). (age 23-55)  Anticoagulants: no Anti-inflammatory: no Antibiotics: no  Procedure: bilateral sacroiliac steroid injection  Position: Prone Start Time:  2:57pm       End Time:  3:05pm  Fluoro Time: 22s  RN/CMA [employer], CMA    Time 2:32pm 3:09pm    BP 117/77 119/73    Pulse 72 72    Respirations 14 14    O2 Sat 98 98    S/S 6 6    Pain Level 4/10 0/10     D/C home with friend, patient A & O X 3, D/C instructions reviewed, and sits independently.

## 2020-01-11 NOTE — Patient Instructions (Addendum)
Sacroiliac injection was performed today. A combination of a numbing medicine lidocaine  plus a cortisone medicine (Celestone)  was injected. The injection was done under x-ray guidance. This procedure has been performed to help reduce low back and buttocks pain as well as potentially hip pain. The duration of this injection is variable lasting from hours to  Months. It may repeated if needed.

## 2020-01-30 ENCOUNTER — Ambulatory Visit: Payer: Self-pay | Admitting: Internal Medicine

## 2020-02-19 ENCOUNTER — Encounter: Payer: Medicaid Other | Admitting: Physical Medicine & Rehabilitation

## 2020-03-19 ENCOUNTER — Ambulatory Visit: Payer: Self-pay | Admitting: Internal Medicine

## 2020-04-21 ENCOUNTER — Other Ambulatory Visit (HOSPITAL_COMMUNITY)
Admission: RE | Admit: 2020-04-21 | Discharge: 2020-04-21 | Disposition: A | Discharge status: Home / Self Care | Payer: Medicaid Other | Source: Ambulatory Visit | Attending: Obstetrics and Gynecology | Admitting: Obstetrics and Gynecology

## 2020-04-21 ENCOUNTER — Encounter: Payer: Self-pay | Admitting: Obstetrics and Gynecology

## 2020-04-21 ENCOUNTER — Other Ambulatory Visit: Payer: Self-pay

## 2020-04-21 ENCOUNTER — Ambulatory Visit (INDEPENDENT_AMBULATORY_CARE_PROVIDER_SITE_OTHER): Payer: Medicaid Other | Admitting: Obstetrics and Gynecology

## 2020-04-21 VITALS — BP 126/87 | HR 84 | Ht 62.0 in | Wt 158.0 lb

## 2020-04-21 DIAGNOSIS — Z1231 Encounter for screening mammogram for malignant neoplasm of breast: Secondary | ICD-10-CM | POA: Diagnosis not present

## 2020-04-21 DIAGNOSIS — Z01419 Encounter for gynecological examination (general) (routine) without abnormal findings: Secondary | ICD-10-CM | POA: Insufficient documentation

## 2020-04-21 NOTE — Progress Notes (Signed)
GYNECOLOGY ANNUAL PREVENTATIVE CARE ENCOUNTER NOTE  History:     Jennifer Jones is a 44 y.o. G10P3003 female here for a routine annual gynecologic exam.  Current complaints: None. She is concerned about having an IUD for years. She has had an IUD for 16 years. She would like to discuss other options for [facility]. She does not have a period with IUD, however does have cramping and lower back pain month. Both her mother and Aunts have gone through menopause around age 59-55   Denies abnormal vaginal bleeding, discharge, pelvic pain, problems with intercourse or other gynecologic concerns.    Gynecologic History No LMP recorded. (Menstrual status: IUD). Contraception: IUD placed 2017 Last Pap: 03/28/2018. Results were: normal with negative HPV, patient would like Pap today.  Last mammogram: 2019. Results were: normal  Obstetric History OB History  Gravida Para Term Preterm AB Living  _0 SAB TAB Ectopic Multiple Live Births          2    # Outcome Date GA Lbr Len/2nd Weight Sex Delivery Anes PTL Lv  3 Term 02/29/04 [redacted]w[redacted]d 7 lb 3 oz (3.26 kg) M CS-LTranv Spinal  LIV  2 Term 12/2000 469w0d8 lb 2 oz (3.685 kg) M CS-LTranv EPI  LIV  1 Term 02/1997 [id number] 8 lb (3.629 kg) M CS-LTranv Gen      Past Medical History:  Diagnosis Date  . Anxiety   . Bulging lumbar disc   . Diabetes mellitus, type II ([place] . Fatty liver   . Fibrocystic breast 03/2007  . 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 H/O varicella   . H/O: menorrhagia 2011  . Hx gestational diabetes 2002 and 2005  . Hx of candidal vulvovaginitis   . Hx: UTI (urinary tract infection)   . Hyperlipemia   . Sciatica   . Vitamin D deficiency     Past Surgical History:  Procedure Laterality Date  . CESAREAN SECTION  98.02, 05    x 3  . COSMETIC SURGERY  2019    Current Outpatient Medications on File Prior to Visit  Medication Sig Dispense Refill  . Blood Glucose Monitoring Suppl (TRUE METRIX METER) W/DEVICE KIT Used  as instructed 1 kit 0  . cholecalciferol (VITAMIN D) 1000 UNITS tablet Take 1,000 Units by mouth daily.     . gMarland Kitchenucose blood (TRUE METRIX BLOOD GLUCOSE TEST) test strip Use as instructed 100 each 12  . levonorgestrel (MIRENA) 20 MCG/24HR IUD 1 each by Intrauterine route once.    . metFORMIN (GLUCOPHAGE) 1000 MG tablet Take 1 tablet (1,000 mg total) by mouth 2 (two) times daily with a meal. 60 tablet 11  . naproxen (NAPROSYN) 500 MG tablet Take 1 tablet (500 mg total) by mouth 2 (two) times daily with a meal. 14 tablet 0  . simvastatin (ZOCOR) 10 MG tablet Take 1 tablet (10 mg total) by mouth at bedtime. 30 tablet 11  . TRUEPLUS LANCETS 28G MISC Use as directed 100 each 12   No current facility-administered medications on file prior to visit.    Allergies  Allergen Reactions  . Codeine Itching  . Darvocet [Propoxyphene N-Acetaminophen] Itching  . Dilaudid [Hydromorphone Hcl] Itching  . Percocet [Oxycodone-Acetaminophen] Itching  . Triamcinolone Rash    Cream caused rash    Social History:  reports that she has never smoked. She has never used smokeless tobacco. She reports that she does not drink  alcohol and does not use drugs.  Family History  Problem Relation Age of Onset  . Hypertension Mother   . Heart disease Mother   . Hyperlipidemia Mother   . Depression Father   . Hyperlipidemia Sister   . Depression Sister   . Hyperlipidemia Brother   . Anxiety disorder Son   . Hyperlipidemia Sister   . Depression Sister   . Hypertension Maternal Grandfather   . Cancer Maternal Grandmother     The following portions of the patient's history were reviewed and updated as appropriate: allergies, current medications, past family history, past medical history, past social history, past surgical history and problem list.  Review of Systems Pertinent items noted in HPI and remainder of comprehensive ROS otherwise negative.  Physical Exam:  BP 126/87   Pulse 84   Ht _0  (1.575 m)    Wt 158 lb (71.7 kg)   BMI 28.90 kg/m  CONSTITUTIONAL: Well-developed, well-nourished female in no acute distress.  HENT:  Normocephalic, atraumatic, External right and left ear normal. Oropharynx is clear and moist EYES: Conjunctivae and EOM are normal. Pupils are equal, round, and reactive to light. No scleral icterus.  NECK: Normal range of motion, supple, no masses.  Normal thyroid.  SKIN: Skin is warm and dry. No rash noted. Not diaphoretic. No erythema. No pallor. MUSCULOSKELETAL: Normal range of motion. No tenderness.  No cyanosis, clubbing, or edema.  2+ distal pulses. NEUROLOGIC: Alert and oriented to person, place, and time. Normal reflexes, muscle tone coordination.  PSYCHIATRIC: Normal mood and affect. Normal behavior. Normal judgment and thought content. CARDIOVASCULAR: Normal heart rate noted, regular rhythm RESPIRATORY: Clear to auscultation bilaterally. Effort and breath sounds normal, no problems with respiration noted. BREASTS: Symmetric in size. No masses, tenderness, skin changes, nipple drainage, or lymphadenopathy bilaterally. Performed in the presence of a chaperone. ABDOMEN: Soft, no distention noted.  No tenderness, rebound or guarding.  PELVIC: Normal appearing external genitalia and urethral meatus; normal appearing vaginal mucosa and cervix.  No abnormal discharge noted.  Pap smear obtained.  Normal uterine size, no other palpable masses, no uterine or adnexal tenderness.  Performed in the presence of a chaperone. IUD strings visualized    Assessment and Plan:   1. Well woman exam with routine gynecological exam  - Cytology - PAP( [place]) - TSH  2. Encounter for screening mammogram for breast cancer  - MM 3D SCREEN BREAST BILATERAL; Future  Will follow up results of pap smear and manage accordingly. Mammogram scheduled Routine preventative health maintenance measures emphasized. Please refer to After Visit Summary for other counseling recommendations.       Jennifer Jones, Artist Pais, [place] for [employer], [place]

## 2020-04-21 NOTE — Patient Instructions (Signed)
[place] [facility]  Department of Social [place] [address], [place], [place] [address] [phone]   or  [place]human[place]services/social-services **SNAP/EBT/ Other nutritional benefits  [place] DHHS-Public Health-W[facility] [address], [place], [place] [address] [phone]  or  https://[place].gov[place]/human-services/health-department **WIC for  women who are pregnant and postpartum, infants and children up to 5 years old  [facility] [address], [place], [place] [address] [phone]   or   www.theblessedtable.org  **Food pantry  [facility] [address], [place], [place] [address] [phone]   or   https://[facility].godaddysites.[email or web address]  **Emergency food and prepared meals  [facility] [address], [place], [place] [address] [phone]   or   www.cedargrovetop.us **Food pantry  [facility] [address], [place], [place] [address] [phone]   or   www.facebook.com/[facility]-116430931718202 **Food pantry  [facility] [address], [place], [place] [address] [phone] **Food pantry  [facility] [address], [place], [place] [address] [phone]   or   www.[facility]ry.org  **Food pantry and prepared meals  [facility] [address], [place], [place] [address] [email or web address]  **Food pantry  [facility] [address], [place], [place] [address] [phone]   or   www.lbcnow.org  **Food pantry  [facility] [address], [place], [place] [address] [phone]   or   http://www.onestepfurther.com **Food pantry, nutrition education, gardening activities  [facility] [address], [place], [place] [address] [phone] **Food pantry  [facility] [address], [place], [place] [address] [phone]   or   www.[facility].org **Food pantry  [facility] [place] [address], [place], [place] [address] [phone]   or   http://[facility].org **Meals on Wheels Program  [facility] [address], [place],  [address] [phone]   or   www.stmattchurch.com  **Food pantry  [facility] [address], [place],  [address] [phone]   or   [facility].org **Food pantry     

## 2020-04-22 LAB — TSH: TSH: 0.664 u[IU]/mL (ref 0.450–4.500)

## 2020-04-25 ENCOUNTER — Other Ambulatory Visit: Payer: Self-pay | Admitting: Internal Medicine

## 2020-04-25 LAB — CYTOLOGY - PAP
Comment: NEGATIVE
Diagnosis: NEGATIVE
High risk HPV: NEGATIVE

## 2020-07-29 ENCOUNTER — Other Ambulatory Visit: Payer: Self-pay | Admitting: Physician Assistant

## 2020-07-29 ENCOUNTER — Ambulatory Visit
Admission: RE | Admit: 2020-07-29 | Discharge: 2020-07-29 | Disposition: A | Discharge status: Home / Self Care | Payer: Medicaid Other | Source: Ambulatory Visit | Attending: Physician Assistant | Admitting: Physician Assistant

## 2020-07-29 DIAGNOSIS — R7611 Nonspecific reaction to tuberculin skin test without active tuberculosis: Secondary | ICD-10-CM

## 2020-09-14 ENCOUNTER — Other Ambulatory Visit: Payer: Self-pay | Admitting: Physician Assistant

## 2020-10-27 ENCOUNTER — Ambulatory Visit: Payer: Medicaid Other

## 2020-12-07 ENCOUNTER — Ambulatory Visit: Payer: Medicaid Other

## 2020-12-15 ENCOUNTER — Encounter: Payer: Self-pay | Admitting: Internal Medicine

## 2020-12-15 ENCOUNTER — Other Ambulatory Visit: Payer: Self-pay

## 2020-12-15 ENCOUNTER — Ambulatory Visit (INDEPENDENT_AMBULATORY_CARE_PROVIDER_SITE_OTHER): Payer: Medicaid Other | Admitting: Internal Medicine

## 2020-12-15 VITALS — BP 120/82 | HR 82 | Ht 62.0 in | Wt 163.8 lb

## 2020-12-15 DIAGNOSIS — E161 Other hypoglycemia: Secondary | ICD-10-CM

## 2020-12-15 DIAGNOSIS — E559 Vitamin D deficiency, unspecified: Secondary | ICD-10-CM | POA: Diagnosis not present

## 2020-12-15 DIAGNOSIS — R7303 Prediabetes: Secondary | ICD-10-CM

## 2020-12-15 MED ORDER — OZEMPIC (0.25 OR 0.5 MG/DOSE) 2 MG/1.5ML ~~LOC~~ SOPN: 0.5000 mg | PEN_INJECTOR | SUBCUTANEOUS | 3 refills | Status: DC

## 2020-12-15 MED ORDER — METFORMIN HCL 1000 MG PO TABS
500.0000 mg | ORAL_TABLET | Freq: Every day | ORAL | 3 refills | Status: DC
Start: 2020-12-15 — End: 2022-01-11

## 2020-12-15 NOTE — Patient Instructions (Addendum)
Please stop at the lab.  Please continue vitamin D 5000 units daily.  Please decrease Metformin to 500 mg daily with dinner.  Please start Ozempic 0.25 mg weekly in a.m. (for example on Sunday morning) x 4 weeks, then you may need to increase to 0.5 mg weekly in a.m. if no nausea or hypoglycemia.  Please come back for a follow-up appointment in 3 months.  PATIENT INSTRUCTIONS FOR TYPE 2 DIABETES:  **Please join MyChart!** - see attached instructions about how to join if you have not done so already.  DIET AND EXERCISE Diet and exercise is an important part of diabetic treatment.  We recommended aerobic exercise in the form of brisk walking (working between 40-60% of maximal aerobic capacity, similar to brisk walking) for 150 minutes per week (such as 30 minutes five days per week) along with 3 times per week performing 'resistance' training (using various gauge rubber tubes with handles) 5-10 exercises involving the major muscle groups (upper body, lower body and core) performing 10-15 repetitions (or near fatigue) each exercise. Start at half the above goal but build slowly to reach the above goals. If limited by weight, joint pain, or disability, we recommend daily walking in a swimming pool with water up to waist to reduce pressure from joints while allow for adequate exercise.    BLOOD GLUCOSES Monitoring your blood glucoses is important for continued management of your diabetes. Please check your blood glucoses 2-4 times a day: fasting, before meals and at bedtime (you can rotate these measurements - e.g. one day check before the 3 meals, the next day check before 2 of the meals and before bedtime, etc.).   HYPOGLYCEMIA (low blood sugar) Hypoglycemia is usually a reaction to not eating, exercising, or taking too much insulin/ other diabetes drugs.  Symptoms include tremors, sweating, hunger, confusion, headache, etc. Treat IMMEDIATELY with 15 grams of Carbs: . 4 glucose tablets .  cup  regular juice/soda . 2 tablespoons raisins . 4 teaspoons sugar . 1 tablespoon honey Recheck blood glucose in 15 mins and repeat above if still symptomatic/blood glucose <100.  RECOMMENDATIONS TO REDUCE YOUR RISK OF DIABETIC COMPLICATIONS: * Take your prescribed MEDICATION(S) * Follow a DIABETIC diet: Complex carbs, fiber rich foods, (monounsaturated and polyunsaturated) fats * AVOID saturated/trans fats, high fat foods, >2,300 mg salt per day. * EXERCISE at least 5 times a week for 30 minutes or preferably daily.  * DO NOT SMOKE OR DRINK more than 1 drink a day. * Check your FEET every day. Do not wear tightfitting shoes. Contact us if you develop an ulcer * See your EYE doctor once a year or more if needed * Get a FLU shot once a year * Get a PNEUMONIA vaccine once before and once after age 38 years  GOALS:  * Your Hemoglobin A1c of <7%  * fasting sugars need to be <130 * after meals sugars need to be <180 (2h after you start eating) * Your Systolic BP should be 378 or lower  * Your Diastolic BP should be 80 or lower  * Your HDL (Good Cholesterol) should be 40 or higher  * Your LDL (Bad Cholesterol) should be 100 or lower. * Your Triglycerides should be 150 or lower  * Your Urine microalbumin (kidney function) should be <30 * Your Body Mass Index should be 25 or lower    Please consider the following ways to cut down carbs and fat and increase fiber and micronutrients in your diet: - substitute  whole grain for white bread or pasta - substitute brown rice for white rice - substitute 90-calorie flat bread pieces for slices of bread when possible - substitute sweet potatoes or yams for white potatoes - substitute humus for margarine - substitute tofu for cheese when possible - substitute almond or rice milk for regular milk (would not drink soy milk daily due to concern for soy estrogen influence on breast cancer risk) - substitute dark chocolate for other sweets when possible -  substitute water - can add lemon or orange slices for taste - for diet sodas (artificial sweeteners will trick your body that you can eat sweets without getting calories and will lead you to overeating and weight gain in the long run) - do not skip breakfast or other meals (this will slow down the metabolism and will result in more weight gain over time)  - can try smoothies made from fruit and almond/rice milk in am instead of regular breakfast - can also try old-fashioned (not instant) oatmeal made with almond/rice milk in am - order the dressing on the side when eating salad at a restaurant (pour less than half of the dressing on the salad) - eat as little meat as possible - can try juicing, but should not forget that juicing will get rid of the fiber, so would alternate with eating raw veg./fruits or drinking smoothies - use as little oil as possible, even when using olive oil - can dress a salad with a mix of balsamic vinegar and lemon juice, for e.g. - use agave nectar, stevia sugar, or regular sugar rather than artificial sweateners - steam or broil/roast veggies  - snack on veggies/fruit/nuts (unsalted, preferably) when possible, rather than processed foods - reduce or eliminate aspartame in diet (it is in diet sodas, chewing gum, etc) Read the labels!  Try to read: Dr. Janene Harvey book: "Program for Reversing Diabetes" for other ideas for healthy eating. Narda Amber, RD's book: "Kickstart Diabetes Cookbook"

## 2020-12-15 NOTE — Progress Notes (Signed)
Patient ID: Jennifer Jones, female   DOB: June 15, 1976, 45 y.o.   MRN: [id number]  This visit occurred during the SARS-CoV-2 public health emergency.  Safety protocols were in place, including screening questions prior to the visit, additional usage of staff PPE, and extensive cleaning of exam room while observing appropriate contact time as indicated for disinfecting solutions.   HPI: Jennifer Jones is a 45 y.o.-year-old female, returning for f/u for prediabetes, dx in 06/2011 - HbA1c was 6.4%, non-insulin-dependent, without long-term complications and also, idiopathic postprandial sd. She also saw Drs. Altheimer and Buddy Duty in the past. Last visit 7 years ago.  She returns to see me as she feels that her sugars are higher lately, up to 250 after eating carbs.  Prediabetes: Reviewed HbA1c levels: 11/02/2020: HbA1c 6.2% Lab Results  Component Value Date   HGBA1C 5.4 09/17/2019   HGBA1C 6.0 12/24/2016   HGBA1C 6.0 08/31/2016  Initial HbA1c was 6.4% >> lost 10 lbs >> HbA1c decreased.   She is on: -Metformin [phone] mg twice a day. She tried Metformin ER >> no changes. We tried acarbose 25 mg before breakfast but she developed bloating and could not tolerate it.  Pt checks her sugars 1-3 times a day: - am: 90-110 >> 90-99 >> 90s >> 118-124 - 2h after b'fast: 120-160 >> 120-140 >> 1000 mg Metformin: 130-140, 500 mg Metformin: 180, no Metformin: 200s - before lunch: n/c >> 1000 mg Metformin: 60-70, 500 mg Metformin: 180, no Metformin: 200s - 2h after lunch: n/c >> 80,120-140 >> n/c  - before dinner: 90-110 >> 90-99 >> n/c  - 2h after dinner: 120-160 >> 120-140 >> 1000 mg Metformin: 130-140, 500 mg Metformin: 180, no Metformin: 200s - bedtime: n/c - nighttime: n/c No lows. Lowest sugar was 80 >> 60-70; she has hypoglycemia awareness in the 80s Highest sugar was 160 >> 140 >> 250.  Glucometer: Accu-Chek.  Pt's meals are: - Breakfast: whole wheat + olive oil, avocado + eggs - Lunch:  sandwich  - Dinner: meat + starch + veggies - Snacks: nuts, fruit No regular exercise. She does not drink sweet drinks. She saw nutrition in the past.  -No CKD, last BUN/creatinine:  11/02/2020: 15/0.55, glucose 131, ACR 7.1 Lab Results  Component Value Date   BUN 15 12/22/2019   CREATININE 0.66 12/22/2019   -+ HL; last set of lipids: 07/27/2020: 160/116/37/101 Lab Results  Component Value Date   CHOL 168 09/17/2019   HDL 47 09/17/2019   LDLCALC 108 (H) 09/17/2019   TRIG 69 09/17/2019   CHOLHDL 4.3 12/24/2016  She continues on simvastatin 20 mg daily, with occasional muscle aches.   She has fatty liver per right upper quadrant ultrasound (12/28/2013): The liver demonstrates coarse echotexture and increased echogenicity, likely reflecting diffuse steatosis. No overt cirrhotic contour abnormalities or focal lesions are identified. There is no evidence of intrahepatic biliary ductal dilatation. The portal vein is open.  IMPRESSION: Findings consistent with hepatic steatosis.   She had an EGD last week >> has stomach ulcers. On Protonix.  - last eye exam was in 11/2019: No DR reportedly. - no numbness and tingling in her feet.  Idiopathic postprandial sd.  Reviewed and addended history: At last visit, she complained of sxs of hypoglycemia without low CBG readings starting 2012.   During this episodes, she felt very fatigued, tremulous, 45 minutes to 2 hours after eating, especially after breakfast.  The episodes were not improving with glucose intake.  Sugars normal during the episodes.  Interestingly, she had a low blood sugar of 45 at one-point but without associated symptoms.  This happened after she increase the Metformin dose to 1000 mg twice a day, now a lower dose.  At this visit, she tells me she continues to have lows in the middle of the day, however, only with Metformin.  When she does not take Metformin, sugars are actually high.  History vitamin D  deficiency:  Previously on 2000 units daily, but we increased the dose to 4000 units daily in 2016.  Now on 5000 units daily.  Review levels: Lab Results  Component Value Date   VD25OH 42.2 09/17/2019   VD25OH 28.1 (L) 01/29/2017   VD25OH 39 09/23/2014   VD25OH 47 06/04/2014   Latest thyroid tests:  05/19/2020: TSH 0.59  ROS: Constitutional: + Weight gain/no weight loss, + fatigue, + subjective hyperthermia, + subjective hypothermia, + poor sleep Eyes: no blurry vision, no xerophthalmia ENT: no sore throat, no nodules palpated in neck, no dysphagia, no odynophagia, no hoarseness Cardiovascular: no CP/no SOB/no palpitations/no leg swelling Respiratory: no cough/no SOB/no wheezing Gastrointestinal: no N/no V/no D/no C/+ acid reflux Musculoskeletal: no muscle aches/no joint aches Skin: no rashes, + hair loss Neurological: no tremors/no numbness/no tingling/no dizziness  I reviewed pt's medications, allergies, PMH, social hx, family hx, and changes were documented in the history of present illness. Otherwise, unchanged from my initial visit note.   Past Medical History:  Diagnosis Date  . Anxiety   . Bulging lumbar disc   . Diabetes mellitus, type II ([place])   . Fatty liver   . Fibrocystic breast 03/2007  . 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 H/O varicella   . H/O: menorrhagia 2011  . Hx gestational diabetes 2002 and 2005  . Hx of candidal vulvovaginitis   . Hx: UTI (urinary tract infection)   . Hyperlipemia   . Sciatica   . Vitamin D deficiency    Past Surgical History:  Procedure Laterality Date  . CESAREAN SECTION  98.02, 05    x 3  . COSMETIC SURGERY  2019   History   Social History  . Marital Status: Married    Spouse Name: N/A  . Number of Children: 3  . Years of Education: bachelor    Occupational History  . 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 Smoking status: Never Smoker   . Smokeless tobacco: Never Used  . Alcohol Use:  No  . Drug Use: No  . Sexual Activity: Yes    Birth Control/ Protection: IUD     Comment: MIRENA placed in 2012    Social History Narrative   Lives with 3 sons, husband.   Mom visits from [place].    Current Outpatient Medications on File Prior to Visit  Medication Sig Dispense Refill  . Blood Glucose Monitoring Suppl (TRUE METRIX METER) W/DEVICE KIT Used as instructed 1 kit 0  . cholecalciferol (VITAMIN D) 1000 UNITS tablet Take 1,000 Units by mouth daily.     Marland Kitchen glucose blood (TRUE METRIX BLOOD GLUCOSE TEST) test strip Use as instructed 100 each 12  . levonorgestrel (MIRENA) 20 MCG/24HR IUD 1 each by Intrauterine route once.    . metFORMIN (GLUCOPHAGE) 1000 MG tablet Take 1 tablet (1,000 mg total) by mouth 2 (two) times daily with a meal. 60 tablet 11  . naproxen (NAPROSYN) 500 MG tablet Take 1 tablet (500 mg total) by mouth 2 (two)  times daily with a meal. 14 tablet 0  . simvastatin (ZOCOR) 10 MG tablet Take 1 tablet (10 mg total) by mouth at bedtime. 30 tablet 11  . TRUEPLUS LANCETS 28G MISC Use as directed 100 each 12   No current facility-administered medications on file prior to visit.   Allergies  Allergen Reactions  . Codeine Itching  . Darvocet [Propoxyphene N-Acetaminophen] Itching  . Dilaudid [Hydromorphone Hcl] Itching  . Percocet [Oxycodone-Acetaminophen] Itching  . Triamcinolone Rash    Cream caused rash   Family History  Problem Relation Age of Onset  . Hypertension Mother   . Heart disease Mother   . Hyperlipidemia Mother   . Depression Father   . Hyperlipidemia Sister   . Depression Sister   . Hyperlipidemia Brother   . Anxiety disorder Son   . Hyperlipidemia Sister   . Depression Sister   . Hypertension Maternal Grandfather   . Cancer Maternal Grandmother    PE: BP 120/82 (BP Location: Right Arm, Patient Position: Sitting, Cuff Size: Normal)   Pulse 82   Ht 5' 2"  (1.575 m)   Wt 163 lb 12.8 oz (74.3 kg)   SpO2 97%   BMI 29.96 kg/m  Body mass  index is 29.96 kg/m. Wt Readings from Last 3 Encounters:  12/15/20 163 lb 12.8 oz (74.3 kg)  04/21/20 158 lb (71.7 kg)  01/11/20 157 lb (71.2 kg)   Constitutional: normal weight, in NAD Eyes: PERRLA, EOMI, no exophthalmos ENT: moist mucous membranes, no thyromegaly, no cervical lymphadenopathy Cardiovascular: RRR, No MRG Respiratory: CTA B Gastrointestinal: abdomen soft, NT, ND, BS+ Musculoskeletal: no deformities, strength intact in all 4 Skin: moist, warm, no rashes Neurological: no tremor with outstretched hands, DTR normal in all 4  ASSESSMENT: 1.  Prediabetes  2. Idiopathic postprandial sd.  3. Vitamin D deficiency  PLAN:  1. Patient with prediabetes, with recent increase in blood sugars.  Latest HbA1c was 6.2% in 10/2020.  We discussed that this is still in the prediabetic range. -She continues on Metformin [phone] mg twice a day.  She feels that she 500 mg is not enough to lower her blood sugars, while 1000 mg drops are too low. -At this visit, I suggested changes in her diet.  In the past, she did not do well on a keto diet and I strongly recommended not to do this again.  I recommended a plant-based diet and gave her references. -At this visit, I also suggested a GLP-1 receptor agonist low-dose, not only for help with her blood sugars but also for her fatty liver.  We also will try to decrease the Metformin to only 500 mg daily with dinner. -I am hoping that her insurance covers the [place].  If not, I advised her to try to go through the patient assistance program and I gave her the forms.  We can also use Victoza, but I would definitely would prefer a once weekly injection for her. -I advised her to: Patient Instructions  Please stop at the lab.  Please continue vitamin D 5000 units daily.  Please decrease Metformin to 500 mg daily with dinner.  Please start Ozempic 0.25 mg weekly in a.m. (for example on Sunday morning) x 4 weeks, then you may need to increase to 0.5  mg weekly in a.m. if no nausea or hypoglycemia.  Please come back for a follow-up appointment in 3 months.  - advised to check sugars at different times of the day - 1x a day, rotating check times -  advised for yearly eye exams >> she is due - return to clinic in 3 months  2.  Idiopathic postprandial sd. -Patient has a history of postprandial fatigue and tremors, not associated with hypoglycemia. -We tried acarbose but did not tolerate this due to bloating and gas -At last visit I referred her to nutrition.  She did see nutrition in the past and actually did quite well after our last visit, but then she started to experience high blood sugars and she also felt poorly when she was trying a keto diet.  Her cholesterol levels worsened during that time. -Her postprandial symptoms have resolved now if she does not take Metformin.  However, sugars are high in this situation.  3. Vitamin D deficiency  -Previous vitamin D level at our last OV >> low so I advised her to increase her vitamin D supplement dose from 2000 units to 4000 units daily -Most recent levels from 09/17/2019: Vitamin D normal, at 42.2 -We will repeat a vitamin D level today -We will continue the current dose for now  Component     Latest Ref Rng & Units 12/15/2020  Vitamin D, 25-Hydroxy     30.0 - 100.0 ng/mL 39.5  Normal vit D.  Philemon Kingdom, MD PhD [facility] Endocrinology

## 2020-12-16 ENCOUNTER — Telehealth: Payer: Self-pay

## 2020-12-16 LAB — VITAMIN D 25 HYDROXY (VIT D DEFICIENCY, FRACTURES): Vit D, 25-Hydroxy: 39.5 ng/mL (ref 30.0–100.0)

## 2020-12-16 NOTE — Telephone Encounter (Signed)
Called and left a detailed message advising her vitamin D levels are normal.

## 2020-12-16 NOTE — Telephone Encounter (Signed)
-----   Message from Philemon Kingdom, MD sent at 12/16/2020 10:10 AM EST ----- Can you please call pt.: Her vitamin D level is normal.

## 2021-01-03 ENCOUNTER — Telehealth: Payer: Self-pay | Admitting: Internal Medicine

## 2021-01-03 NOTE — Telephone Encounter (Signed)
Patient came in and dropped of Patient Butler. Patient also provider [id number] for year 2021.  Patient requesting that after completing please make a copy, place in envelope at the front, and call patient to pick copy up

## 2021-01-24 ENCOUNTER — Other Ambulatory Visit: Payer: Self-pay

## 2021-01-24 ENCOUNTER — Ambulatory Visit
Admission: RE | Admit: 2021-01-24 | Discharge: 2021-01-24 | Disposition: A | Discharge status: Home / Self Care | Payer: Medicaid Other | Source: Ambulatory Visit | Attending: Obstetrics and Gynecology | Admitting: Obstetrics and Gynecology

## 2021-01-24 DIAGNOSIS — Z1231 Encounter for screening mammogram for malignant neoplasm of breast: Secondary | ICD-10-CM

## 2021-03-17 ENCOUNTER — Ambulatory Visit: Payer: Medicaid Other | Admitting: Internal Medicine

## 2021-06-15 ENCOUNTER — Encounter: Payer: Self-pay | Admitting: Internal Medicine

## 2021-06-15 ENCOUNTER — Ambulatory Visit: Payer: Medicaid Other | Admitting: Internal Medicine

## 2021-06-15 ENCOUNTER — Other Ambulatory Visit: Payer: Self-pay

## 2021-06-15 VITALS — BP 120/88 | HR 81 | Ht 62.0 in | Wt 167.2 lb

## 2021-06-15 DIAGNOSIS — E66811 Obesity, class 1: Secondary | ICD-10-CM

## 2021-06-15 DIAGNOSIS — E669 Obesity, unspecified: Secondary | ICD-10-CM | POA: Diagnosis not present

## 2021-06-15 DIAGNOSIS — R7303 Prediabetes: Secondary | ICD-10-CM

## 2021-06-15 DIAGNOSIS — E161 Other hypoglycemia: Secondary | ICD-10-CM

## 2021-06-15 DIAGNOSIS — E559 Vitamin D deficiency, unspecified: Secondary | ICD-10-CM

## 2021-06-15 LAB — POCT GLYCOSYLATED HEMOGLOBIN (HGB A1C): Hemoglobin A1C: 6.4 % — AB (ref 4.0–5.6)

## 2021-06-15 MED ORDER — TRULICITY 0.75 MG/0.5ML ~~LOC~~ SOAJ: 0.7500 mg | SUBCUTANEOUS | 5 refills | Status: DC

## 2021-06-15 NOTE — Addendum Note (Signed)
Addended by: Lauralyn Primes on: 06/15/2021 03:38 PM   Modules accepted: Orders

## 2021-06-15 NOTE — Patient Instructions (Addendum)
Please continue vitamin D 5000 units daily.  Please continue: - Metformin 500 mg daily with dinner   Please start Trulicity [id number] mg weekly. Let me know when you are close to running out to call in the higher dose to your pharmacy (1.5 mg).  Please come back for a follow-up appointment in 4 months.

## 2021-06-15 NOTE — Progress Notes (Signed)
Patient ID: Jennifer Jones, female   DOB: 07-11-1976, 45 y.o.   MRN: [id number]  This visit occurred during the SARS-CoV-2 public health emergency.  Safety protocols were in place, including screening questions prior to the visit, additional usage of staff PPE, and extensive cleaning of exam room while observing appropriate contact time as indicated for disinfecting solutions.   HPI: Jennifer Jones is a 45 y.o.-year-old female, returning for f/u for prediabetes, dx in 06/2011 - HbA1c was 6.4%, non-insulin-dependent, without long-term complications and also, idiopathic postprandial sd. She also saw Drs. Altheimer and Buddy Duty in the past. Last visit 6 months ago..  Interim history: No increased urination, blurry vision, nausea, chest pain. She has muscle aches in her calves and upper thighs.  This is different than her previous sciatica pain.  She does not feel this is related to her shoes.  She is very active, walking up to 15000 steps a day.  Prediabetes: Reviewed HbA1c levels: 02/01/2021: HbA1c 6.5% 11/02/2020: HbA1c 6.2% Lab Results  Component Value Date   HGBA1C 5.4 09/17/2019   HGBA1C 6.0 12/24/2016   HGBA1C 6.0 08/31/2016  Initial HbA1c was 6.4% >> lost 10 lbs >> HbA1c decreased.   She is on: -Metformin [phone] mg twice a day >> 500 mg once a day at night We tried Ozempic 0.25 mg in 12/2020 - not covered by insurance.  She tried Metformin ER >> no changes. We tried acarbose 25 mg before breakfast but she developed bloating and could not tolerate it.  Pt checks her sugars 1-3 times a day: - am: 90-110 >> 90-99 >> 90s >> 118-124 >> 130s - 2h after b'fast: 120-140  >> 160-210 or 70s if takes Metformin 500 with B'fast - before lunch: n/c >> n/c - 2h after lunch: n/c >> 80,120-140 >> n/c  - before dinner: 90-110 >> 90-99 >> n/c  - 2h after dinner: 120-160 >> 120-140 >> 180-190 - bedtime: n/c - nighttime: n/c No lows. Lowest sugar was 80 >> 60-70 >> 70s; she has hypoglycemia  awareness in the 70s. Highest sugar was 160 >> 140 >> 250 >> 210.  Glucometer: Accu-Chek.  Pt's meals are: - Breakfast: whole wheat + olive oil, avocado + eggs - Lunch: sandwich  - Dinner: meat + starch + veggies - Snacks: nuts, fruit No regular exercise but stays active. She does not drink sweet drinks. She saw nutrition in the past.  -No CKD, last BUN/creatinine:  11/02/2020: 15/0.55, glucose 131, ACR 7.1 Lab Results  Component Value Date   BUN 15 12/22/2019   CREATININE 0.66 12/22/2019   -+ HL; last set of lipids: 07/27/2020: 160/116/37/101 Lab Results  Component Value Date   CHOL 168 09/17/2019   HDL 47 09/17/2019   LDLCALC 108 (H) 09/17/2019   TRIG 69 09/17/2019   CHOLHDL 4.3 12/24/2016  She continues on simvastatin 20 mg daily, with occasional muscle aches.   She has fatty liver per right upper quadrant ultrasound (12/28/2013): The liver demonstrates coarse echotexture and increased echogenicity, likely reflecting diffuse steatosis. No overt cirrhotic contour abnormalities or focal lesions are identified. There is no evidence of intrahepatic biliary ductal dilatation. The portal vein is open.   IMPRESSION: Findings consistent with hepatic steatosis.   She had an EGD last week >> has stomach ulcers. On Protonix.  - last eye exam was in 11/2019: No DR reportedly.  - no numbness and tingling in her feet.  Idiopathic postprandial sd.  Reviewed and addended history: At last visit, she complained of sxs of  hypoglycemia without low CBG readings starting 2012.   During this episodes, she felt very fatigued, tremulous, 45 minutes to 2 hours after eating, especially after breakfast.  The episodes were not improving with glucose intake.  Sugars normal during the episodes.    Interestingly, she had a low blood sugar of 45 at one-point but without associated symptoms.  This happened after she increase the Metformin dose to 1000 mg twice a day, now a lower dose.  At  last visit, continued to have lows in the middle of the day when taking Metformin in am.  We stopped the morning dose of metformin.  At this visit, she does not have low blood sugars in the midday anymore.  History vitamin D deficiency:  Previously on 2000 units daily, but we increased the dose to 4000 units daily in 2016.  Now on 5000 units daily.  Review levels: Lab Results  Component Value Date   VD25OH 39.5 12/15/2020   VD25OH 42.2 09/17/2019   VD25OH 28.1 (L) 01/29/2017   VD25OH 39 09/23/2014   VD25OH 47 06/04/2014   Latest thyroid tests:  05/19/2020: TSH 0.59  ROS: Constitutional: + Weight gain Eyes: no blurry vision, no xerophthalmia ENT: no sore throat, no nodules palpated in neck, no dysphagia, no odynophagia, no hoarseness Cardiovascular: no CP/no SOB/no palpitations/no leg swelling Respiratory: no cough/no SOB/no wheezing Gastrointestinal: no N/no V/no D/no C/+ acid reflux Musculoskeletal:+ muscle aches/no joint aches Skin: no rashes Neurological: no tremors/no numbness/no tingling/no dizziness  I reviewed pt's medications, allergies, PMH, social hx, family hx, and changes were documented in the history of present illness. Otherwise, unchanged from my initial visit note.   Past Medical History:  Diagnosis Date   Anxiety    Bulging lumbar disc    Diabetes mellitus, type II ([place])    Fatty liver    Fibrocystic breast 03/2007   Genital atrophy of female 2005   H/O varicella    H/O: menorrhagia 2011   Hx gestational diabetes 2002 and 2005   Hx of candidal vulvovaginitis    Hx: UTI (urinary tract infection)    Hyperlipemia    Sciatica    Vitamin D deficiency    Past Surgical History:  Procedure Laterality Date   CESAREAN SECTION  98.02, 05    x 3   COSMETIC SURGERY  2019   History   Social History   Marital Status: Married    Spouse Name: N/A   Number of Children: 3   Years of Education: bachelor    Occupational History   Pre K Museum/gallery conservator    Social History Main Topics   Smoking status: Never Smoker    Smokeless tobacco: Never Used   Alcohol Use: No   Drug Use: No   Sexual Activity: Yes    Patent examiner Protection: IUD     Comment: MIRENA placed in 2012    Social History Narrative   Lives with 3 sons, husband.   Mom visits from [place].    Current Outpatient Medications on File Prior to Visit  Medication Sig Dispense Refill   Blood Glucose Monitoring Suppl (TRUE METRIX METER) W/DEVICE KIT Used as instructed 1 kit 0   cholecalciferol (VITAMIN D) 1000 UNITS tablet Take 1,000 Units by mouth daily.      glucose blood (TRUE METRIX BLOOD GLUCOSE TEST) test strip Use as instructed 100 each 12   levonorgestrel (MIRENA) 20 MCG/24HR IUD 1 each by Intrauterine route once.  metFORMIN (GLUCOPHAGE) 1000 MG tablet Take 0.5 tablets (500 mg total) by mouth daily with supper. 30 tablet 3   naproxen (NAPROSYN) 500 MG tablet Take 1 tablet (500 mg total) by mouth 2 (two) times daily with a meal. 14 tablet 0   pantoprazole (PROTONIX) 40 MG tablet Take 40 mg by mouth daily.     Semaglutide,0.25 or 0.5MG/DOS, (OZEMPIC, 0.25 OR 0.5 MG/DOSE,) 2 MG/1.5ML SOPN Inject 0.5 mg into the skin once a week. 4.5 mL 3   simvastatin (ZOCOR) 10 MG tablet Take 1 tablet (10 mg total) by mouth at bedtime. 30 tablet 11   TRUEPLUS LANCETS 28G MISC Use as directed 100 each 12   No current facility-administered medications on file prior to visit.   Allergies  Allergen Reactions   Codeine Itching   Darvocet [Propoxyphene N-Acetaminophen] Itching   Dilaudid [Hydromorphone Hcl] Itching   Percocet [Oxycodone-Acetaminophen] Itching   Triamcinolone Rash    Cream caused rash   Family History  Problem Relation Age of Onset   Hypertension Mother    Heart disease Mother    Hyperlipidemia Mother    Depression Father    Hyperlipidemia Sister    Depression Sister    Hyperlipidemia Brother    Anxiety disorder Son    Hyperlipidemia Sister     Depression Sister    Hypertension Maternal Grandfather    Cancer Maternal Grandmother    PE: BP 120/88 (BP Location: Right Arm, Patient Position: Sitting, Cuff Size: Normal)   Pulse 81   Ht _0  (1.575 m)   Wt 167 lb 3.2 oz (75.8 kg)   SpO2 99%   BMI 30.58 kg/m  Body mass index is 30.58 kg/m. Wt Readings from Last 3 Encounters:  06/15/21 167 lb 3.2 oz (75.8 kg)  12/15/20 163 lb 12.8 oz (74.3 kg)  04/21/20 158 lb (71.7 kg)   Constitutional: normal weight, in NAD Eyes: PERRLA, EOMI, no exophthalmos ENT: moist mucous membranes, no thyromegaly, no cervical lymphadenopathy Cardiovascular: RRR, No MRG Respiratory: CTA B Gastrointestinal: abdomen soft, NT, ND, BS+ Musculoskeletal: no deformities, strength intact in all 4 Skin: moist, warm, no rashes Neurological: no tremor with outstretched hands, DTR normal in all 4  ASSESSMENT: 1.  Prediabetes  2. Idiopathic postprandial sd.  3. Vitamin D deficiency  4.  Obesity class I  PLAN:  1. Patient with prediabetes, with higher blood sugars at last visit, and an HbA1c of 6.2% in 10/2020.  At that time, we ended up decreasing the dose of metformin and I advised her to start low-dose of Ozempic not only to help with her blood sugars but also with her fatty liver.  Of note, she has been on a keto diet before after which her cholesterol levels worsened.  I suggested not to repeat this but to try a plant-based diet. -She had another HbA1c since last visit, on 02/01/2021 and this was higher, at 6.5% -At today's visit, sugars are at or slightly higher than goal in the morning, and they increase after 210 after breakfast.  She did try again to take metformin in the morning but this dropped her sugars to 70s so she is now only taking it at night.  After dinner, blood sugars are at the upper limit of the target range or slightly higher.   -She tells me that she was not able to obtain Ozempic as this was not covered by Medicaid.  She tried to apply to  the patient assistance program for [employer] but she was  told that she did not qualify because she had insurance... At this visit, we discussed about trying Trulicity which appears to be covered by her insurance.  If not, we can try the patient assistance program with [employer].  If this is not fruitful, we may try to add an SGLT2 inhibitor.  For now, we will continue metformin low-dose at night. -I advised her to: Patient Instructions  Please continue vitamin D 5000 units daily.  Please continue: - Metformin 500 mg daily with dinner  Please start Trulicity 1.61 mg weekly. Let me know when you are close to running out to call in the higher dose to your pharmacy (1.5 mg).  Please come back for a follow-up appointment in 4 months.  - we checked her HbA1c: 6.4% (slightly lower) - advised to check sugars at different times of the day - 1x a day, rotating check times - advised for yearly eye exams >> she is UTD - return to clinic in 4 months  2.  Idiopathic postprandial sd. -Patient has a history of postprandial fatigue and tremors, not associated with hypoglycemia -We tried acarbose but she did not tolerate it due to bloating and gas -She saw nutrition in the past. -She tried a keto diet but she felt worse and had high blood sugars and high cholesterol during that time so she stopped. -At last visit she described that sugars were lower in the middle of the day when she was taking metformin.  We decreased the dose of metformin to only take it with dinner.  No low blood sugars during the day anymore.  3. Vitamin D deficiency  -We increased her vitamin D supplement from 2000 to 5000 units daily, which she continues today -At last visit, vitamin D was normal, at 39  4.  Obesity class I -She gained 9 pounds in the last year, 4 pounds since last visit -She was not able to start Ozempic but will try to start Trulicity which should also help with weight loss  Philemon Kingdom, MD PhD [facility]  Endocrinology

## 2021-07-28 ENCOUNTER — Other Ambulatory Visit: Payer: Self-pay

## 2021-07-28 ENCOUNTER — Encounter (HOSPITAL_BASED_OUTPATIENT_CLINIC_OR_DEPARTMENT_OTHER): Payer: Self-pay

## 2021-07-28 ENCOUNTER — Emergency Department (HOSPITAL_BASED_OUTPATIENT_CLINIC_OR_DEPARTMENT_OTHER)
Admission: EM | Admit: 2021-07-28 | Discharge: 2021-07-28 | Disposition: A | Discharge status: Home / Self Care | Payer: Medicaid Other | Attending: Emergency Medicine | Admitting: Emergency Medicine

## 2021-07-28 DIAGNOSIS — Z7984 Long term (current) use of oral hypoglycemic drugs: Secondary | ICD-10-CM | POA: Diagnosis not present

## 2021-07-28 DIAGNOSIS — E119 Type 2 diabetes mellitus without complications: Secondary | ICD-10-CM | POA: Insufficient documentation

## 2021-07-28 DIAGNOSIS — N6310 Unspecified lump in the right breast, unspecified quadrant: Secondary | ICD-10-CM | POA: Diagnosis not present

## 2021-07-28 DIAGNOSIS — N6313 Unspecified lump in the right breast, lower outer quadrant: Secondary | ICD-10-CM | POA: Insufficient documentation

## 2021-07-28 DIAGNOSIS — N644 Mastodynia: Secondary | ICD-10-CM | POA: Diagnosis present

## 2021-07-28 NOTE — ED Triage Notes (Signed)
Patient to ED from home with c/o right breast pain x3 day. Patient reports having lump in breast with pain so bad today she could not rest.

## 2021-07-28 NOTE — ED Notes (Signed)
MD did [place] at the bedside

## 2021-07-28 NOTE — ED Provider Notes (Signed)
[facility] DEPT Provider Note   CSN: [id number] Arrival date & time: 07/28/21  1935     History Chief Complaint  Patient presents with   Breast Pain    Jennifer Jones is a 45 y.o. female.  Patient is a 45 year old female with a history of diabetes who is presenting today with complaints of right-sided breast pain that started 2 days ago and has gradually worsened.  She reports she noticed a lump today and it is been very uncomfortable and she cannot lay on that side.  She has not had any drainage from her nipples.  She has not recently had any children and she is not breast-feeding.  She has not noticed any redness to the breast.  She does follow-up with OB/GYN and gets regular mammograms which have all been normal.  Breast cancer does not run in her family.  She has not had any fever, general malaise or infectious symptoms in the last week.  The history is provided by the patient.      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   Chronic left sacroiliac joint pain 01/31/2017   Lumbar degenerative disc disease 01/31/2017   Urinary frequency 01/29/2017   Estrogen excess 01/29/2017   Hot flashes 12/24/2016   Claustrophobia 12/24/2016   Vagina itching 08/31/2016   IUD (intrauterine device) in place 04/22/2016   Left-sided low back pain without sciatica 04/19/2016   External  hemorrhoids 12/05/2015   Joint pain 07/08/2015   Right elbow pain 12/03/2014   Prediabetes 06/04/2014   Hair loss 06/04/2014   Fatty liver disease, nonalcoholic 86/57/8469   Vitamin D deficiency 06/04/2014   Overweight (BMI 25.0-29.9) 06/04/2014   Right knee pain 06/04/2014   Tension headache 06/04/2014    Past Surgical History:  Procedure Laterality Date   CESAREAN SECTION  98.02, 05    x 3   COSMETIC SURGERY  2019     OB History     Gravida  3   Para  3   Term  3   Preterm      AB      Living  3      SAB      IAB      Ectopic      Multiple      Live Births  2           Family History  Problem Relation Age of Onset   Hypertension Mother    Heart disease Mother    Hyperlipidemia Mother    Depression Father    Hyperlipidemia Sister    Depression Sister    Hyperlipidemia Brother    Anxiety disorder Son    Hyperlipidemia Sister    Depression Sister    Hypertension Maternal Grandfather    Cancer Maternal Grandmother  Social History   Tobacco Use   Smoking status: Never   Smokeless tobacco: Never  Vaping Use   Vaping Use: Never used  Substance Use Topics   Alcohol use: Never   Drug use: Never    Home Medications Prior to Admission medications   Medication Sig Start Date End Date Taking? Authorizing Provider  Blood Glucose Monitoring Suppl (TRUE METRIX METER) W/DEVICE KIT Used as instructed 06/24/15   Boykin Nearing, MD  cholecalciferol (VITAMIN D) 1000 UNITS tablet Take 1,000 Units by mouth daily.     [provider]  Dulaglutide (TRULICITY) 9.93 ZJ/6.9CV SOPN Inject 0.75 mg into the skin once a week. 06/15/21   Philemon Kingdom, MD  glucose blood (TRUE METRIX BLOOD GLUCOSE TEST) test strip Use as instructed 01/29/17   Boykin Nearing, MD  levonorgestrel (MIRENA) 20 MCG/24HR IUD 1 each by Intrauterine route once.    [provider]  metFORMIN (GLUCOPHAGE) 1000 MG tablet Take 0.5 tablets (500 mg total) by mouth daily  with supper. 12/15/20   Philemon Kingdom, MD  naproxen (NAPROSYN) 500 MG tablet Take 1 tablet (500 mg total) by mouth 2 (two) times daily with a meal. 10/24/19   Palumbo, April, MD  pantoprazole (PROTONIX) 40 MG tablet Take 40 mg by mouth daily. 12/09/20   [provider]  Semaglutide,0.25 or 0.5MG/DOS, (OZEMPIC, 0.25 OR 0.5 MG/DOSE,) 2 MG/1.5ML SOPN Inject 0.5 mg into the skin once a week. 12/15/20   Philemon Kingdom, MD  simvastatin (ZOCOR) 10 MG tablet Take 1 tablet (10 mg total) by mouth at bedtime. 08/31/16   Boykin Nearing, MD  TRUEPLUS LANCETS 28G MISC Use as directed 10/11/15   Boykin Nearing, MD    Allergies    Codeine, Darvocet [propoxyphene n-acetaminophen], Dilaudid [hydromorphone hcl], Percocet [oxycodone-acetaminophen], and Triamcinolone  Review of Systems   Review of Systems  All other systems reviewed and are negative.  Physical Exam Updated Vital Signs BP 117/70   Pulse 73   Temp 98.4 F (36.9 C)   Resp 18   Ht _0  (1.6 m)   Wt 74.4 kg   SpO2 99%   BMI 29.05 kg/m   Physical Exam Vitals and nursing note reviewed.  Constitutional:      General: She is not in acute distress.    Appearance: Normal appearance. She is normal weight.  HENT:     Head: Normocephalic.  Eyes:     Pupils: Pupils are equal, round, and reactive to light.  Cardiovascular:     Rate and Rhythm: Normal rate.  Pulmonary:     Effort: Pulmonary effort is normal.  Chest:     Chest wall: Mass present.    Abdominal:     General: Abdomen is flat.  Musculoskeletal:     Cervical back: Normal range of motion and neck supple.  Skin:    General: Skin is warm and dry.     Capillary Refill: Capillary refill takes less than 2 seconds.  Neurological:     General: No focal deficit present.     Mental Status: She is alert and oriented to person, place, and time. Mental status is at baseline.  Psychiatric:        Mood and Affect: Mood normal.        Behavior: Behavior normal.    ED  Results / Procedures / Treatments   Labs (all labs ordered are listed, but only abnormal results are displayed) Labs Reviewed - No data to display  EKG None  Radiology No results  found.  Procedures Procedures   Medications Ordered in ED Medications - No data to display  ED Course  I have reviewed the triage vital signs and the nursing notes.  Pertinent labs & imaging results that were available during my care of the patient were reviewed by me and considered in my medical decision making (see chart for details).    MDM Rules/Calculators/A&P                           Patient presenting today with a new lump in her right breast that is been present for 1 to 2 days and is sore with palpation.  She denies any trauma.  No evidence of infection.  Patient had a normal mammogram a few months ago and no prior history of breast cancer.  Suspect this is a cystic breast lesion related to hormone fluctuation.  We will have her follow-up with her OB/GYN but in the meantime try Tylenol or ibuprofen as needed for discomfort.  Final Clinical Impression(s) / ED Diagnoses Final diagnoses:  Mass of lower outer quadrant of right breast    Rx / DC Orders ED Discharge Orders     None        Blanchie Dessert, MD 07/28/21 2247

## 2021-07-28 NOTE — Discharge Instructions (Signed)
The lump that you have today is most likely a cyst based on your normal mammogram a few months ago and the ultrasound findings today.  There is no sign of infection.  Take Tylenol and ibuprofen as needed for the discomfort.  Follow-up with your OB/GYN if the lump does not go away.

## 2021-07-28 NOTE — ED Notes (Signed)
Pt verbalizes understanding of discharge instructions. Opportunity for questioning and answers were provided. Armand removed by staff, pt discharged from ED to home. Educated to take tylenol as needed and f/u with OBGYN.

## 2021-08-02 ENCOUNTER — Telehealth: Payer: Self-pay | Admitting: Internal Medicine

## 2021-08-02 NOTE — Telephone Encounter (Signed)
Please advise 

## 2021-08-02 NOTE — Telephone Encounter (Signed)
Patient requests to be called at ph# [phone] re: Patient states that the 6th injection of Trulicity caused Patient to get an itching, burning rash on her belly. This has happened twice.

## 2021-08-03 NOTE — Telephone Encounter (Signed)
Called and advised pt to d/c Trulicity and monitor blood sugars. Pt advised to call back if sugars stay high.

## 2021-08-31 DIAGNOSIS — Z23 Encounter for immunization: Secondary | ICD-10-CM | POA: Diagnosis not present

## 2021-10-26 ENCOUNTER — Encounter: Payer: Self-pay | Admitting: Internal Medicine

## 2021-10-26 ENCOUNTER — Ambulatory Visit: Payer: Medicaid Other | Admitting: Internal Medicine

## 2021-10-26 ENCOUNTER — Other Ambulatory Visit: Payer: Self-pay

## 2021-10-26 VITALS — BP 122/68 | HR 77 | Ht 63.0 in | Wt 171.4 lb

## 2021-10-26 DIAGNOSIS — E161 Other hypoglycemia: Secondary | ICD-10-CM

## 2021-10-26 DIAGNOSIS — E1165 Type 2 diabetes mellitus with hyperglycemia: Secondary | ICD-10-CM | POA: Diagnosis not present

## 2021-10-26 DIAGNOSIS — E559 Vitamin D deficiency, unspecified: Secondary | ICD-10-CM | POA: Diagnosis not present

## 2021-10-26 DIAGNOSIS — E669 Obesity, unspecified: Secondary | ICD-10-CM | POA: Diagnosis not present

## 2021-10-26 LAB — POCT GLYCOSYLATED HEMOGLOBIN (HGB A1C): Hemoglobin A1C: 6.9 % — AB (ref 4.0–5.6)

## 2021-10-26 MED ORDER — DAPAGLIFLOZIN PROPANEDIOL 5 MG PO TABS: 5.0000 mg | ORAL_TABLET | Freq: Every day | ORAL | 3 refills | Status: DC

## 2021-10-26 NOTE — Progress Notes (Signed)
Patient ID: Jennifer Jones, female   DOB: 1976-01-17, 46 y.o.   MRN: [id number]  This visit occurred during the SARS-CoV-2 public health emergency.  Safety protocols were in place, including screening questions prior to the visit, additional usage of staff PPE, and extensive cleaning of exam room while observing appropriate contact time as indicated for disinfecting solutions.   HPI: Jennifer Jones is a 46 y.o.-year-old female, returning for f/u for prediabetes, dx in 06/2011 - HbA1c was 6.4%, non-insulin-dependent, without long-term complications and also, idiopathic postprandial sd. She also saw Drs. Altheimer and Buddy Duty in the past. Last visit 4 months ago..  Interim history: No increased urination, blurry vision, nausea, chest pain. She is frustrated about weight gain.  Diabetes: Reviewed HbA1c levels: Lab Results  Component Value Date   HGBA1C 6.4 (A) 06/15/2021   HGBA1C 5.4 09/17/2019   HGBA1C 6.0 12/24/2016  02/01/2021: HbA1c 6.5% 11/02/2020: HbA1c 6.2% Initial HbA1c was 6.4% >> lost 10 lbs >> HbA1c decreased.   She is on: -Metformin [phone] mg twice a day >> 500 mg once a day at night We tried Ozempic 0.25 mg in 12/2020 - not covered by insurance.  She tried Metformin ER >> no changes. We tried acarbose 25 mg before breakfast but she developed bloating and could not tolerate it. She tried Trulicity but developed an itching rash and had to stop.  Pt checks her sugars 1-3 times a day: - am:  90-99 >> 90s >> 118-124 >> 130s >> 130-164 - 2h after b'fast: 160-210 or 70s if takes Metformin 500 with B'fast >> 170-200 - before lunch: n/c >> n/c - 2h after lunch: n/c >> 80,120-140 >> n/c  - before dinner: 90-110 >> 90-99 >> n/c  - 2h after dinner: 120-160 >> 120-140 >> 180-190 >> 200s - bedtime: n/c - nighttime: n/c No lows. Lowest sugar was 70s >> 130; she has hypoglycemia awareness in the 70s. Highest sugar was 250 >> 210 >> 200s.  Glucometer: Accu-Chek.  Pt's meals  are: - Breakfast: whole wheat + olive oil, avocado + eggs - Lunch: sandwich  - Dinner: meat + starch + veggies - Snacks: nuts, fruit No regular exercise but stays active. She does not drink sweet drinks. She saw nutrition in the past.  -No CKD, last BUN/creatinine:  11/02/2020: 15/0.55, glucose 131, ACR 7.1 Lab Results  Component Value Date   BUN 15 12/22/2019   CREATININE 0.66 12/22/2019   -+ HL; last set of lipids: 07/27/2020: 160/116/37/101 Lab Results  Component Value Date   CHOL 168 09/17/2019   HDL 47 09/17/2019   LDLCALC 108 (H) 09/17/2019   TRIG 69 09/17/2019   CHOLHDL 4.3 12/24/2016  She continues on simvastatin 20 mg daily, with occasional muscle aches.   She has fatty liver per right upper quadrant ultrasound (12/28/2013): The liver demonstrates coarse echotexture and increased echogenicity, likely reflecting diffuse steatosis. No overt cirrhotic contour abnormalities or focal lesions are identified. There is no evidence of intrahepatic biliary ductal dilatation. The portal vein is open.   IMPRESSION: Findings consistent with hepatic steatosis.   She had an EGD in 2022 >> stomach ulcers. On Protonix.  - last eye exam was in 12/2020 No DR reportedly.  - no numbness and tingling in her feet.  Idiopathic postprandial sd.  Reviewed  history: At last visit, she complained of sxs of hypoglycemia without low CBG readings starting 2012.   During this episodes, she felt very fatigued, tremulous, 45 minutes to 2 hours after eating, especially after breakfast.  The episodes were not improving with glucose intake.  Sugars normal during the episodes.    Interestingly, she had a low blood sugar of 45 at one-point but without associated symptoms.  This happened after she increase the Metformin dose to 1000 mg twice a day, now a lower dose.  At last visit, continued to have lows in the middle of the day when taking Metformin in am.  We stopped the morning dose of  metformin.  At this visit, she does not have low blood sugars anymore.  History vitamin D deficiency:  Previously on 2000 units daily, but we increased the dose to 4000 units daily in 2016.  Now on 5000 units daily.  Review levels: Lab Results  Component Value Date   VD25OH 39.5 12/15/2020   VD25OH 42.2 09/17/2019   VD25OH 28.1 (L) 01/29/2017   VD25OH 39 09/23/2014   VD25OH 47 06/04/2014   Latest thyroid tests:  05/19/2020: TSH 0.59  ROS: Constitutional: + Weight gain + See HPI  I reviewed pt's medications, allergies, PMH, social hx, family hx, and changes were documented in the history of present illness. Otherwise, unchanged from my initial visit note.   Past Medical History:  Diagnosis Date   Anxiety    Bulging lumbar disc    Diabetes mellitus, type II ([place])    Fatty liver    Fibrocystic breast 03/2007   Genital atrophy of female 2005   H/O varicella    H/O: menorrhagia 2011   Hx gestational diabetes 2002 and 2005   Hx of candidal vulvovaginitis    Hx: UTI (urinary tract infection)    Hyperlipemia    Sciatica    Vitamin D deficiency    Past Surgical History:  Procedure Laterality Date   CESAREAN SECTION  98.02, 05    x 3   COSMETIC SURGERY  2019   History   Social History   Marital Status: Married    Spouse Name: N/A   Number of Children: 3   Years of Education: bachelor    Occupational History   Pre K Administrator, arts    Social History Main Topics   Smoking status: Never Smoker    Smokeless tobacco: Never Used   Alcohol Use: No   Drug Use: No   Sexual Activity: Yes    Patent examiner Protection: IUD     Comment: MIRENA placed in 2012    Social History Narrative   Lives with 3 sons, husband.   Mom visits from [place].    Current Outpatient Medications on File Prior to Visit  Medication Sig Dispense Refill   Blood Glucose Monitoring Suppl (TRUE METRIX METER) W/DEVICE KIT Used as instructed 1 kit 0   cholecalciferol (VITAMIN D)  1000 UNITS tablet Take 1,000 Units by mouth daily.      Dulaglutide (TRULICITY) 7.90 XY/3.3XO SOPN Inject 0.75 mg into the skin once a week. 2 mL 5   glucose blood (TRUE METRIX BLOOD GLUCOSE TEST) test strip Use as instructed 100 each 12   levonorgestrel (MIRENA) 20 MCG/24HR IUD 1 each by Intrauterine route once.     metFORMIN (GLUCOPHAGE) 1000 MG tablet Take 0.5 tablets (500 mg total) by mouth daily with supper. 30 tablet 3   naproxen (NAPROSYN) 500 MG tablet Take 1 tablet (500 mg total) by mouth 2 (two) times daily with a meal. 14 tablet 0   pantoprazole (PROTONIX) 40 MG tablet Take 40 mg by mouth daily.     Semaglutide,0.25  or 0.5MG/DOS, (OZEMPIC, 0.25 OR 0.5 MG/DOSE,) 2 MG/1.5ML SOPN Inject 0.5 mg into the skin once a week. 4.5 mL 3   simvastatin (ZOCOR) 10 MG tablet Take 1 tablet (10 mg total) by mouth at bedtime. 30 tablet 11   TRUEPLUS LANCETS 28G MISC Use as directed 100 each 12   No current facility-administered medications on file prior to visit.   Allergies  Allergen Reactions   Codeine Itching   Darvocet [Propoxyphene N-Acetaminophen] Itching   Dilaudid [Hydromorphone Hcl] Itching   Percocet [Oxycodone-Acetaminophen] Itching   Triamcinolone Rash    Cream caused rash   Family History  Problem Relation Age of Onset   Hypertension Mother    Heart disease Mother    Hyperlipidemia Mother    Depression Father    Hyperlipidemia Sister    Depression Sister    Hyperlipidemia Brother    Anxiety disorder Son    Hyperlipidemia Sister    Depression Sister    Hypertension Maternal Grandfather    Cancer Maternal Grandmother    PE: BP 122/68 (BP Location: Right Arm, Patient Position: Sitting, Cuff Size: Normal)    Pulse 77    Ht _0  (1.6 m)    Wt 171 lb 6.4 oz (77.7 kg)    SpO2 97%    BMI 30.36 kg/m   Wt Readings from Last 3 Encounters:  10/26/21 171 lb 6.4 oz (77.7 kg)  07/28/21 164 lb (74.4 kg)  06/15/21 167 lb 3.2 oz (75.8 kg)   Constitutional: normal weight, in  NAD Eyes: PERRLA, EOMI, no exophthalmos ENT: moist mucous membranes, no thyromegaly, no cervical lymphadenopathy Cardiovascular: RRR, No MRG Respiratory: CTA B Musculoskeletal: no deformities, strength intact in all 4 Skin: moist, warm, no rashes Neurological: no tremor with outstretched hands, DTR normal in all 4  ASSESSMENT: 1.  DM2, non-insulin-dependent  2. Idiopathic postprandial sd.  3. Vitamin D deficiency  4.  Obesity class I  PLAN:  1. Patient with history of prediabetes, with an HbA1c of 6.5% on 02/01/2021.  A subsequent HbA1c obtained at last visit on 06/15/2021 was lower, at 6.4%.  At last visit, sugars were at or slightly higher than goal in the morning and they were increasing above 210 after breakfast.  She did try again to take metformin in the morning but they dropped her sugars to 70s and she backed off to only taking it at night.  After dinner, blood sugars were at the upper limit of the target range or slightly higher.  She was not able to obtain Ozempic as this was not covered by Medicaid and she could not apply to the patient assistance program as she had insurance.  At last visit I suggested Trulicity.  She could not tolerate it due to itching and a rash at the injection site.  However, she mentions that her sugars are great when she was taking it. -We reviewed together her HbA1c from today and I explained that she now has to values that are above 6.4%, so she now has a diagnosis of type 2 diabetes. -At this visit, sugars are higher, above target at all times of the day.  We discussed about options for treatment.  I believe she would do well with another GLP-1 receptor agonist but unfortunately Ozempic was not covered for her (she has Medicaid) and she is reticent to try Victoza which is injected daily if there are other options.  I suggested an SGLT2 inhibitor and she agreed to try Farxiga, 5 mg daily before  breakfast.  Discussed about benefits and possible side effects  including vaginal yeast infections.  I advised her to stay very well-hydrated while taking the medication. -I advised her to: Patient Instructions  Please continue vitamin D 5000 units daily.  Please continue: - Metformin 500 mg daily with dinner  Please start: - Farxiga 5 mg daily before b'fast  Please come back for a follow-up appointment in 4 months.  - we checked her HbA1c: 6.9%, higher - advised to check sugars at different times of the day - 1x a day, rotating check times - advised for yearly eye exams >> she is UTD - return to clinic in 4 months  2.  Idiopathic postprandial sd. -She has a history of postprandial fatigue and tremors, not associated with hypoglycemia -We tried acarbose but she did not tolerate it due to bloating and gas -She saw nutrition in the past. -She was on the keto diet but she felt worse and sugars were lower -She described lower blood sugars in the middle of the day, after taking metformin in the morning.  I advised her to only take metformin with dinner.  No low blood sugars in the daytime anymore.  3. Vitamin D deficiency  -On vitamin D 5000 units daily -Vitamin D level with normal, at 39 at last check -We will check the vitamin D level today  4.  Obesity class I -She gained 13 pounds before the last 2 visits combined -She gained 7 pounds since last visit -At last visit I suggested to start Trulicity to help with weight loss, but she could not tolerate it due to itching and burning rash. -At this visit, we will try Farxiga, which should also help with weight loss  Component     Latest Ref Rng & Units 10/26/2021  Sodium     135 - 145 mEq/L 139  Potassium     3.5 - 5.1 mEq/L 3.9  Chloride     96 - 112 mEq/L 102  CO2     19 - 32 mEq/L 27  Glucose     70 - 99 mg/dL 77  BUN     6 - 23 mg/dL 12  Creatinine     0.40 - 1.20 mg/dL 0.59  Total Bilirubin     0.2 - 1.2 mg/dL 0.4  Alkaline Phosphatase     39 - 117 U/L 54  AST     0 - 37 U/L  15  ALT     0 - 35 U/L 21  Total Protein     6.0 - 8.3 g/dL 6.8  Albumin     3.5 - 5.2 g/dL 4.5  GFR     >60.00 mL/min 106.95  Calcium     8.4 - 10.5 mg/dL 9.5  Cholesterol     0 - 200 mg/dL 149  Triglycerides     0.0 - 149.0 mg/dL 170.0 (H)  HDL Cholesterol     >39.00 mg/dL 39.40  VLDL     0.0 - 40.0 mg/dL 34.0  LDL (calc)     0 - 99 mg/dL 75  Total CHOL/HDL Ratio      4  NonHDL      109.42  Microalb, Ur     0.0 - 1.9 mg/dL 1.4  Creatinine,U     mg/dL 116.7  MICROALB/CREAT RATIO     0.0 - 30.0 mg/g 1.2  VITD     30.00 - 100.00 ng/mL 36.32   Labs are at goal with the exception  of a slightly high triglycerides, however, this was not a fasting sample.  Philemon Kingdom, MD PhD [facility]

## 2021-10-26 NOTE — Patient Instructions (Signed)
Please continue vitamin D 5000 units daily.  Please continue: - Metformin 500 mg daily with dinner  Please start: - Farxiga 5 mg daily before b'fast  Please come back for a follow-up appointment in 4 months.

## 2021-10-27 ENCOUNTER — Other Ambulatory Visit (HOSPITAL_COMMUNITY): Payer: Self-pay

## 2021-10-27 ENCOUNTER — Encounter: Payer: Self-pay | Admitting: Internal Medicine

## 2021-10-27 ENCOUNTER — Telehealth: Payer: Self-pay

## 2021-10-27 LAB — COMPREHENSIVE METABOLIC PANEL
ALT: 21 U/L (ref 0–35)
AST: 15 U/L (ref 0–37)
Albumin: 4.5 g/dL (ref 3.5–5.2)
Alkaline Phosphatase: 54 U/L (ref 39–117)
BUN: 12 mg/dL (ref 6–23)
CO2: 27 mEq/L (ref 19–32)
Calcium: 9.5 mg/dL (ref 8.4–10.5)
Chloride: 102 mEq/L (ref 96–112)
Creatinine, Ser: 0.59 mg/dL (ref 0.40–1.20)
GFR: 106.95 mL/min (ref >60.00)
Glucose, Bld: 77 mg/dL (ref 70–99)
Potassium: 3.9 mEq/L (ref 3.5–5.1)
Sodium: 139 mEq/L (ref 135–145)
Total Bilirubin: 0.4 mg/dL (ref 0.2–1.2)
Total Protein: 6.8 g/dL (ref 6.0–8.3)

## 2021-10-27 LAB — LIPID PANEL
Cholesterol: 149 mg/dL (ref 0–200)
HDL: 39.4 mg/dL (ref >39.00)
LDL Cholesterol: 75 mg/dL (ref 0–99)
NonHDL: 109.42
Total CHOL/HDL Ratio: 4
Triglycerides: 170 mg/dL — ABNORMAL HIGH (ref 0.0–149.0)
VLDL: 34 mg/dL (ref 0.0–40.0)

## 2021-10-27 LAB — MICROALBUMIN / CREATININE URINE RATIO
Creatinine,U: 116.7 mg/dL
Microalb Creat Ratio: 1.2 mg/g (ref 0.0–30.0)
Microalb, Ur: 1.4 mg/dL (ref 0.0–1.9)

## 2021-10-27 LAB — VITAMIN D 25 HYDROXY (VIT D DEFICIENCY, FRACTURES): VITD: 36.32 ng/mL (ref 30.00–100.00)

## 2021-10-27 NOTE — Telephone Encounter (Signed)
Patient returned call and requests to be called at ph# [phone].  Also, Patient states she went to [facility] to pick up [name], however, PHARM told Patient they require Dr. Arman Filter approval, therefore, Patient was unable to get the above named medication.Marland Kitchen

## 2021-10-27 NOTE — Telephone Encounter (Addendum)
Called and left a message for pt to call back for results. ----- Message from Philemon Kingdom, MD sent at 10/27/2021  2:47 PM EST ----- Can you please call pt.:  Labs are at goal with the exception of a slightly high triglycerides, however, this was not a fasting sample.

## 2021-10-27 NOTE — Telephone Encounter (Signed)
Contacted pt and advised her of results. Pt advised authorization was needed for rx. Prior auth team notified.

## 2021-10-30 ENCOUNTER — Other Ambulatory Visit (HOSPITAL_COMMUNITY): Payer: Self-pay

## 2021-11-02 DIAGNOSIS — B3731 Acute candidiasis of vulva and vagina: Secondary | ICD-10-CM | POA: Diagnosis not present

## 2021-11-02 DIAGNOSIS — R300 Dysuria: Secondary | ICD-10-CM | POA: Diagnosis not present

## 2021-11-03 NOTE — Telephone Encounter (Signed)
Prior auth team advised no PA required. Pt advised via [employer].

## 2021-11-15 DIAGNOSIS — N898 Other specified noninflammatory disorders of vagina: Secondary | ICD-10-CM | POA: Diagnosis not present

## 2021-11-15 DIAGNOSIS — B3731 Acute candidiasis of vulva and vagina: Secondary | ICD-10-CM | POA: Diagnosis not present

## 2021-11-27 ENCOUNTER — Other Ambulatory Visit (HOSPITAL_COMMUNITY): Payer: Self-pay

## 2021-11-27 ENCOUNTER — Telehealth: Payer: Self-pay | Admitting: Pharmacy Technician

## 2021-11-27 NOTE — Telephone Encounter (Signed)
Patient Advocate Encounter  Received notification from PHARMACY([facility]) regarding a prior authorization for [facility]. Authorization has been APPROVED from 2.13.23 to 2.13.2024.   Per test claim, copay for 90 days supply is $4   Authorization # PA Case ID: [id number]     Received notification from [facility] that prior authorization for FARXIGA 5MG  is required.   PA submitted on 2.13.23 [facility]  Status is pending   [facility] will continue to follow  Luciano Cutter, CPhT Patient Advocate [facility] Phone: [phone] Fax:  [phone]

## 2021-11-27 NOTE — Telephone Encounter (Signed)
Called pharmacy to check on status of script. Pt apparently has two insurances'. PA was not required through [employer] [facility] which is $15. [employer] however, is the insurance the patient has at the pharmacy. PA has been submitted and approved under [employer] ($4) until 2.13.2024 and has been processed at the pharmacy. They do not have the full amount today (#90), but can give the patient #30 today and the rest will be in tomorrow.

## 2021-12-11 DIAGNOSIS — B349 Viral infection, unspecified: Secondary | ICD-10-CM | POA: Diagnosis not present

## 2021-12-12 DIAGNOSIS — B349 Viral infection, unspecified: Secondary | ICD-10-CM | POA: Diagnosis not present

## 2021-12-12 DIAGNOSIS — R52 Pain, unspecified: Secondary | ICD-10-CM | POA: Diagnosis not present

## 2021-12-12 DIAGNOSIS — R0989 Other specified symptoms and signs involving the circulatory and respiratory systems: Secondary | ICD-10-CM | POA: Diagnosis not present

## 2021-12-12 DIAGNOSIS — J029 Acute pharyngitis, unspecified: Secondary | ICD-10-CM | POA: Diagnosis not present

## 2021-12-14 ENCOUNTER — Encounter: Payer: Self-pay | Admitting: Internal Medicine

## 2021-12-14 ENCOUNTER — Other Ambulatory Visit: Payer: Self-pay | Admitting: Internal Medicine

## 2021-12-14 MED ORDER — FLUCONAZOLE 150 MG PO TABS: 150.0000 mg | ORAL_TABLET | Freq: Once | ORAL | 1 refills | Status: AC

## 2021-12-26 DIAGNOSIS — B3731 Acute candidiasis of vulva and vagina: Secondary | ICD-10-CM | POA: Diagnosis not present

## 2022-01-10 ENCOUNTER — Encounter: Payer: Self-pay | Admitting: Internal Medicine

## 2022-01-11 ENCOUNTER — Other Ambulatory Visit: Payer: Self-pay | Admitting: Internal Medicine

## 2022-01-11 MED ORDER — METFORMIN HCL 1000 MG PO TABS: 500.0000 mg | ORAL_TABLET | Freq: Every day | ORAL | 3 refills | Status: DC

## 2022-01-29 ENCOUNTER — Ambulatory Visit: Payer: Medicaid Other | Admitting: Internal Medicine

## 2022-01-29 ENCOUNTER — Encounter: Payer: Self-pay | Admitting: Internal Medicine

## 2022-01-29 VITALS — BP 118/68 | HR 62 | Ht 63.0 in | Wt 162.0 lb

## 2022-01-29 DIAGNOSIS — E663 Overweight: Secondary | ICD-10-CM

## 2022-01-29 DIAGNOSIS — E559 Vitamin D deficiency, unspecified: Secondary | ICD-10-CM

## 2022-01-29 DIAGNOSIS — E1165 Type 2 diabetes mellitus with hyperglycemia: Secondary | ICD-10-CM | POA: Diagnosis not present

## 2022-01-29 LAB — POCT GLYCOSYLATED HEMOGLOBIN (HGB A1C): Hemoglobin A1C: 6 % — AB (ref 4.0–5.6)

## 2022-01-29 NOTE — Progress Notes (Signed)
Patient ID: Jennifer Jones, female   DOB: Nov 22, 1975, 46 y.o.   MRN: [id number] ? ?This visit occurred during the SARS-CoV-2 public health emergency.  Safety protocols were in place, including screening questions prior to the visit, additional usage of staff PPE, and extensive cleaning of exam room while observing appropriate contact time as indicated for disinfecting solutions.  ? ?HPI: ?Jennifer Jones is a 46 y.o.-year-old female, returning for f/u for prediabetes, dx in 06/2011 - HbA1c was 6.4%, non-insulin-dependent, without long-term complications and also, idiopathic postprandial sd. She also saw Drs. Altheimer and Buddy Duty in the past. Last visit 4 months ago.. ? ?Interim history: ?No increased urination, blurry vision, nausea, chest pain. ?She was able to get [place] on 12/08/2021 >> took it for 1 mo, then stopped 2/2 Ramadan. This will end in 4 days. ?She had a yeast inf >> tried Diflucan, then Terconazole cream ? ?Diabetes: ?Reviewed HbA1c levels: ?Lab Results  ?Component Value Date  ? HGBA1C 6.9 (A) 10/26/2021  ? HGBA1C 6.4 (A) 06/15/2021  ? HGBA1C 5.4 09/17/2019  ?02/01/2021: HbA1c 6.5% ?11/02/2020: HbA1c 6.2% ?Initial HbA1c was 6.4% >> lost 10 lbs >> HbA1c decreased.  ? ?She is on: ?-Metformin [phone] mg twice a day >> 500 mg once a day at night ?- Farxiga 5 mg before b'fast - started 11/2021 - not in last 3 weeks 2/2 Ramadan ?We tried Ozempic 0.25 mg in 12/2020 - not covered by insurance.  ?She tried Metformin ER >> no changes. ?We tried acarbose 25 mg before breakfast but she developed bloating and could not tolerate it. ?She tried Trulicity but developed an itching rash and had to stop. ? ?Pt checks her sugars 1-3 times a day: ?- am:  90-99 >> 90s >> 118-124 >> 130s >> 130-164 >> 110-124 ?- 2h after b'fast: 160-210 or 70s if takes Metformin 500 with B'fast >> 170-200 >> n/c ?- before lunch: n/c >> n/c ?- 2h after lunch: n/c >> 80,120-140 >> n/c  ?- before dinner: 90-110 >> 90-99 >> n/c  ?- 2h after  dinner: 120-160 >> 120-140 >> 180-190 >> 200s >> 116-198 ?- bedtime: n/c ?- nighttime: n/c ?No lows. Lowest sugar was 70s >> 130 >> 98; she has hypoglycemia awareness in the 70s. ?Highest sugar was 250 >> 210 >> 200s.>> 198 ? ?Glucometer: Accu-Chek. ? ?Pt's meals are: ?- Breakfast: whole wheat + olive oil, avocado + eggs ?- Lunch: sandwich  ?- Dinner: meat + starch + veggies ?- Snacks: nuts, fruit ?No regular exercise but stays active. ?She does not drink sweet drinks. ?She saw nutrition in the past. ? ?-No CKD, last BUN/creatinine:  ?Lab Results  ?Component Value Date  ? BUN 12 10/26/2021  ? CREATININE 0.59 10/26/2021  ? ?-+ HL; last set of lipids: ?Lab Results  ?Component Value Date  ? CHOL 149 10/26/2021  ? HDL 39.40 10/26/2021  ? Haena 75 10/26/2021  ? TRIG 170.0 (H) 10/26/2021  ? CHOLHDL 4 10/26/2021  ?07/27/2020: 160/116/37/101 ?She continues on simvastatin 20 mg daily, with occasional muscle aches.  ? ?She has fatty liver per right upper quadrant ultrasound (12/28/2013): ?The liver demonstrates coarse echotexture and increased ?echogenicity, likely reflecting diffuse steatosis. No overt ?cirrhotic contour abnormalities or focal lesions are identified. ?There is no evidence of intrahepatic biliary ductal dilatation. The ?portal vein is open. ?  ?IMPRESSION: ?Findings consistent with hepatic steatosis. ? ? She had an EGD in 2022 >> stomach ulcers. On Protonix. ? ?- last eye exam was in 12/2020: No DR reportedly. ? ?-  no numbness and tingling in her feet. ? ?Idiopathic postprandial sd.  ?Reviewed  history: ?At last visit, she complained of sxs of hypoglycemia without low CBG readings starting 2012.  ? ?During this episodes, she felt very fatigued, tremulous, 45 minutes to 2 hours after eating, especially after breakfast.  The episodes were not improving with glucose intake.  Sugars normal during the episodes.   ? ?Interestingly, she had a low blood sugar of 45 at one-point but without associated symptoms.   This happened after she increase the Metformin dose to 1000 mg twice a day, now a lower dose. ? ?At last visit, continued to have lows in the middle of the day when taking Metformin in am.  We stopped the morning dose of metformin. ? ?At this visit, she does not have low blood sugars anymore. ? ?History vitamin D deficiency: ? ?Previously on 2000 units daily, but we increased the dose to 4000 units daily in 2016.  Now on 5000 units daily. ? ?Review levels: ?Lab Results  ?Component Value Date  ? VD25OH 36.32 10/26/2021  ? VD25OH 39.5 12/15/2020  ? VD25OH 42.2 09/17/2019  ? VD25OH 28.1 (L) 01/29/2017  ? VD25OH 39 09/23/2014  ? VD25OH 47 06/04/2014  ? ?Latest thyroid tests:  ?05/19/2020: TSH 0.59 ? ?ROS: ?Constitutional:  ?+ See HPI ? ?I reviewed pt's medications, allergies, PMH, social hx, family hx, and changes were documented in the history of present illness. Otherwise, unchanged from my initial visit note. ? ? ?Past Medical History:  ?Diagnosis Date  ? Anxiety   ? Bulging lumbar disc   ? Diabetes mellitus, type II ([place])   ? Fatty liver   ? Fibrocystic breast 03/2007  ? Genital atrophy of female 2005  ? H/O varicella   ? H/O: menorrhagia 2011  ? Hx gestational diabetes 2002 and 2005  ? Hx of candidal vulvovaginitis   ? Hx: UTI (urinary tract infection)   ? Hyperlipemia   ? Sciatica   ? Vitamin D deficiency   ? ?Past Surgical History:  ?Procedure Laterality Date  ? CESAREAN SECTION  98.02, 05  ?  x 3  ? COSMETIC SURGERY  2019  ? ?History  ? ?Social History  ? Marital Status: Married  ?  Spouse Name: N/A  ? Number of Children: 3  ? Years of Education: bachelor   ? ?Occupational History  ? Pre K teacher    ?  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 ?Social History Main Topics  ? Smoking status: Never Smoker   ? Smokeless tobacco: Never Used  ? Alcohol Use: No  ? Drug Use: No  ? Sexual Activity: Yes  ?  Birth Control/ Protection: IUD  ?   Comment: MIRENA placed in 2012   ? ?Social History Narrative  ? Lives with 3 sons, husband.  ? Mom  visits from [place].   ? ?Current Outpatient Medications on File Prior to Visit  ?Medication Sig Dispense Refill  ? Blood Glucose Monitoring Suppl (TRUE METRIX METER) W/DEVICE KIT Used as instructed 1 kit 0  ? cholecalciferol (VITAMIN D) 1000 UNITS tablet Take 1,000 Units by mouth daily.     ? dapagliflozin propanediol (FARXIGA) 5 MG TABS tablet Take 1 tablet (5 mg total) by mouth daily before breakfast. 90 tablet 3  ? glucose blood (TRUE METRIX BLOOD GLUCOSE TEST) test strip Use as instructed 100 each 12  ? levonorgestrel (MIRENA) 20 MCG/24HR IUD 1 each by Intrauterine route once.    ? metFORMIN (GLUCOPHAGE) 1000 MG  tablet Take 0.5 tablets (500 mg total) by mouth daily with supper. 45 tablet 3  ? naproxen (NAPROSYN) 500 MG tablet Take 1 tablet (500 mg total) by mouth 2 (two) times daily with a meal. 14 tablet 0  ? pantoprazole (PROTONIX) 40 MG tablet Take 40 mg by mouth daily.    ? simvastatin (ZOCOR) 10 MG tablet Take 1 tablet (10 mg total) by mouth at bedtime. 30 tablet 11  ? TRUEPLUS LANCETS 28G MISC Use as directed 100 each 12  ? ?No current facility-administered medications on file prior to visit.  ? ?Allergies  ?Allergen Reactions  ? Codeine Itching  ? Darvocet [Propoxyphene N-Acetaminophen] Itching  ? Dilaudid [Hydromorphone Hcl] Itching  ? Percocet [Oxycodone-Acetaminophen] Itching  ? Triamcinolone Rash  ?  Cream caused rash  ? ?Family History  ?Problem Relation Age of Onset  ? Hypertension Mother   ? Heart disease Mother   ? Hyperlipidemia Mother   ? Depression Father   ? Hyperlipidemia Sister   ? Depression Sister   ? Hyperlipidemia Brother   ? Anxiety disorder Son   ? Hyperlipidemia Sister   ? Depression Sister   ? Hypertension Maternal Grandfather   ? Cancer Maternal Grandmother   ? ?PE: ?BP 118/68 (BP Location: Left Arm, Patient Position: Sitting, Cuff Size: Normal)   Pulse 62   Ht _0  (1.6 m)   Wt 162 lb (73.5 kg)   SpO2 97%   BMI 28.70 kg/m?   ?Wt Readings from Last 3 Encounters:  ?01/29/22  162 lb (73.5 kg)  ?10/26/21 171 lb 6.4 oz (77.7 kg)  ?07/28/21 164 lb (74.4 kg)  ? ?Constitutional: normal weight, in NAD ?Eyes: PERRLA, EOMI, no exophthalmos ?ENT: moist mucous membranes, no thyromegaly, no cervical

## 2022-01-29 NOTE — Patient Instructions (Signed)
Please continue vitamin D 5000 units daily. ? ?Please continue: ?- Metformin 500 mg daily with dinner ? ?Please restart: ?- Farxiga 5 mg daily before b'fast - after finishing fasting ? ?Please come back for a follow-up appointment in 4-6 months. ?

## 2022-02-02 DIAGNOSIS — N952 Postmenopausal atrophic vaginitis: Secondary | ICD-10-CM | POA: Diagnosis not present

## 2022-02-02 DIAGNOSIS — E119 Type 2 diabetes mellitus without complications: Secondary | ICD-10-CM | POA: Diagnosis not present

## 2022-02-02 DIAGNOSIS — N898 Other specified noninflammatory disorders of vagina: Secondary | ICD-10-CM | POA: Diagnosis not present

## 2022-02-02 DIAGNOSIS — B3732 Chronic candidiasis of vulva and vagina: Secondary | ICD-10-CM | POA: Diagnosis not present

## 2022-02-02 DIAGNOSIS — K760 Fatty (change of) liver, not elsewhere classified: Secondary | ICD-10-CM | POA: Diagnosis not present

## 2022-02-28 DIAGNOSIS — N951 Menopausal and female climacteric states: Secondary | ICD-10-CM | POA: Diagnosis not present

## 2022-02-28 DIAGNOSIS — B3732 Chronic candidiasis of vulva and vagina: Secondary | ICD-10-CM | POA: Diagnosis not present

## 2022-02-28 DIAGNOSIS — N6311 Unspecified lump in the right breast, upper outer quadrant: Secondary | ICD-10-CM | POA: Diagnosis not present

## 2022-02-28 DIAGNOSIS — N898 Other specified noninflammatory disorders of vagina: Secondary | ICD-10-CM | POA: Diagnosis not present

## 2022-03-02 DIAGNOSIS — K760 Fatty (change of) liver, not elsewhere classified: Secondary | ICD-10-CM | POA: Diagnosis not present

## 2022-03-02 DIAGNOSIS — A048 Other specified bacterial intestinal infections: Secondary | ICD-10-CM | POA: Diagnosis not present

## 2022-03-02 DIAGNOSIS — R1013 Epigastric pain: Secondary | ICD-10-CM | POA: Diagnosis not present

## 2022-03-06 DIAGNOSIS — E559 Vitamin D deficiency, unspecified: Secondary | ICD-10-CM | POA: Diagnosis not present

## 2022-03-06 DIAGNOSIS — R52 Pain, unspecified: Secondary | ICD-10-CM | POA: Diagnosis not present

## 2022-03-06 DIAGNOSIS — E785 Hyperlipidemia, unspecified: Secondary | ICD-10-CM | POA: Diagnosis not present

## 2022-03-06 DIAGNOSIS — M79674 Pain in right toe(s): Secondary | ICD-10-CM | POA: Diagnosis not present

## 2022-03-06 DIAGNOSIS — E1169 Type 2 diabetes mellitus with other specified complication: Secondary | ICD-10-CM | POA: Diagnosis not present

## 2022-03-06 DIAGNOSIS — Z20822 Contact with and (suspected) exposure to covid-19: Secondary | ICD-10-CM | POA: Diagnosis not present

## 2022-03-06 DIAGNOSIS — R519 Headache, unspecified: Secondary | ICD-10-CM | POA: Diagnosis not present

## 2022-03-07 DIAGNOSIS — R928 Other abnormal and inconclusive findings on diagnostic imaging of breast: Secondary | ICD-10-CM | POA: Diagnosis not present

## 2022-03-07 DIAGNOSIS — N6311 Unspecified lump in the right breast, upper outer quadrant: Secondary | ICD-10-CM | POA: Diagnosis not present

## 2022-06-08 DIAGNOSIS — E1169 Type 2 diabetes mellitus with other specified complication: Secondary | ICD-10-CM | POA: Diagnosis not present

## 2022-06-08 DIAGNOSIS — M79671 Pain in right foot: Secondary | ICD-10-CM | POA: Diagnosis not present

## 2022-06-26 ENCOUNTER — Encounter: Payer: Self-pay | Admitting: Internal Medicine

## 2022-07-05 ENCOUNTER — Ambulatory Visit (INDEPENDENT_AMBULATORY_CARE_PROVIDER_SITE_OTHER): Payer: Medicaid Other

## 2022-07-05 ENCOUNTER — Ambulatory Visit: Payer: Medicaid Other | Admitting: Podiatry

## 2022-07-05 DIAGNOSIS — M7751 Other enthesopathy of right foot: Secondary | ICD-10-CM

## 2022-07-05 DIAGNOSIS — G629 Polyneuropathy, unspecified: Secondary | ICD-10-CM | POA: Diagnosis not present

## 2022-07-05 DIAGNOSIS — M722 Plantar fascial fibromatosis: Secondary | ICD-10-CM

## 2022-07-05 DIAGNOSIS — M7752 Other enthesopathy of left foot: Secondary | ICD-10-CM | POA: Diagnosis not present

## 2022-07-05 MED ORDER — TRIAMCINOLONE ACETONIDE 10 MG/ML IJ SUSP
20.0000 mg | Freq: Once | INTRAMUSCULAR | Status: AC
  Administered 2022-07-05: 20 mg

## 2022-07-05 MED ORDER — DICLOFENAC SODIUM 75 MG PO TBEC: 75.0000 mg | DELAYED_RELEASE_TABLET | Freq: Two times a day (BID) | ORAL | 2 refills | Status: DC

## 2022-07-08 ENCOUNTER — Encounter: Payer: Self-pay | Admitting: Podiatry

## 2022-07-08 NOTE — Progress Notes (Signed)
Subjective:   Patient ID: Jennifer Jones, female   DOB: 46 y.o.   MRN: [id number]   HPI Patient presents with pain in the forefoot of both feet of 2 months duration.  Patient does not remember specific injury change in shoe gear or activity levels and its been quite achy and sore without tingling.  Patient does not smoke likes to be active   Review of Systems  All other systems reviewed and are negative.       Objective:  Physical Exam Vitals and nursing note reviewed.  Constitutional:      Appearance: She is well-developed.  Pulmonary:     Effort: Pulmonary effort is normal.  Musculoskeletal:        General: Normal range of motion.  Skin:    General: Skin is warm.  Neurological:     Mental Status: She is alert.     Neurovascular status found to be intact muscle strength was found to be adequate range of motion adequate with patient found to have exquisite discomfort in the forefoot area around the third metatarsal phalangeal joint with mild to moderate fluid buildup no digital perfusion issues muscle strength issues and patient well oriented     Assessment:  Appears to be inflammatory capsulitis of the third MPJ bilateral which may have been brought on by shoe gear choices for other subtle pathology     Plan:  Patient NP x-rays reviewed sterile prep injected periarticular around the third MPJ 3 mg dexamethasone Kenalog 5 mg Xylocaine advised rigid bottom shoes and padding and reappoint as symptoms indicate  X-rays indicate that there is no signs of fracture or arthritis or parabola issues around the joint surface

## 2022-07-25 ENCOUNTER — Ambulatory Visit: Payer: Medicaid Other | Admitting: Diagnostic Neuroimaging

## 2022-07-25 ENCOUNTER — Encounter: Payer: Self-pay | Admitting: Diagnostic Neuroimaging

## 2022-07-25 VITALS — BP 116/78 | HR 67 | Ht 63.0 in | Wt 165.2 lb

## 2022-07-25 DIAGNOSIS — M79672 Pain in left foot: Secondary | ICD-10-CM | POA: Diagnosis not present

## 2022-07-25 DIAGNOSIS — E539 Vitamin B deficiency, unspecified: Secondary | ICD-10-CM | POA: Diagnosis not present

## 2022-07-25 DIAGNOSIS — M79671 Pain in right foot: Secondary | ICD-10-CM

## 2022-07-25 NOTE — Progress Notes (Signed)
[facility]  PATIENT: Jennifer Jones DOB: 1975-12-13  REFERRING CLINICIAN: Turmel, Caleb P, PA  HISTORY FROM: patient  REASON FOR VISIT: new consult    HISTORICAL  CHIEF COMPLAINT:  Chief Complaint  Patient presents with   New Patient (Initial Visit)    Pt reports having lots of discomfort and pain in both feet. She reports feeling burning and aches in feet that is lasting all day. Room 7 alone    HISTORY OF PRESENT ILLNESS:   UPDATE (07/25/22, VRP): Since last visit, prior issue has resolved.  Now having a new problem of aching shooting pain in toes and feet, with burning sensation on the balls of feet bilaterally.  Symptoms started around 2 to 3 months ago.  Has been to podiatrist for evaluation.  Also followed up here for evaluation of possible neuropathy.  Diabetes has been under fairly good control with recent A1c 6.0.  PRIOR HPI (9024): 46 year old female here for evaluation of numbness in the feet.  August 2019 patient was having cosmetic surgery, 1 hour procedure under general anesthesia.  When she woke up she was very tired and groggy.  She slept for the next 13 hours.  When she woke up from her sleep she noticed some numbness and tingling sensation in bilateral heels.  She also noted some discoloration on her lateral malleoli bilaterally.  She was having significant pain and difficulty standing up.  Symptoms were severe for 2 weeks and gradually improved.  Eventually she realized that she must have been crossing her feet during her 13 hours sleep postoperatively.  Since that time symptoms have improved but still continues with numbness on the bilateral heel region.  She has some mild discoloration on the skin.  No weakness.  No problems with her fingers or arms.  No vision changes slurred speech or trouble talking. She does have history of diabetes with hemoglobin A1c around 5.9-6.0.  No history of diabetic neuropathy.    REVIEW OF SYSTEMS: Full 14 system  review of systems performed and negative with exception of: Numbness anxiety.  ALLERGIES: Allergies  Allergen Reactions   Codeine Itching   Cyclobenzaprine     Other reaction(s): hives, with naprosyn   Darvocet [Propoxyphene N-Acetaminophen] Itching   Dilaudid [Hydromorphone Hcl] Itching   Dulaglutide     Other reaction(s): local reaction   Percocet [Oxycodone-Acetaminophen] Itching   Triamcinolone Rash    Cream caused rash    HOME MEDICATIONS: Outpatient Medications Prior to Visit  Medication Sig Dispense Refill   Blood Glucose Monitoring Suppl (TRUE METRIX METER) W/DEVICE KIT Used as instructed 1 kit 0   cholecalciferol (VITAMIN D) 1000 UNITS tablet Take 1,000 Units by mouth daily.      dapagliflozin propanediol (FARXIGA) 5 MG TABS tablet Take 1 tablet (5 mg total) by mouth daily before breakfast. 90 tablet 3   diclofenac (VOLTAREN) 75 MG EC tablet Take 1 tablet (75 mg total) by mouth 2 (two) times daily. 50 tablet 2   glucose blood (TRUE METRIX BLOOD GLUCOSE TEST) test strip Use as instructed 100 each 12   levonorgestrel (MIRENA) 20 MCG/24HR IUD 1 each by Intrauterine route once.     metFORMIN (GLUCOPHAGE) 1000 MG tablet Take 0.5 tablets (500 mg total) by mouth daily with supper. 45 tablet 3   naproxen (NAPROSYN) 500 MG tablet Take 1 tablet (500 mg total) by mouth 2 (two) times daily with a meal. 14 tablet 0   pantoprazole (PROTONIX) 40 MG tablet Take 40 mg by mouth  daily.     simvastatin (ZOCOR) 10 MG tablet Take 1 tablet (10 mg total) by mouth at bedtime. 30 tablet 11   TRUEPLUS LANCETS 28G MISC Use as directed 100 each 12   No facility-administered medications prior to visit.    PAST MEDICAL HISTORY: Past Medical History:  Diagnosis Date   Anxiety    Bulging lumbar disc    Diabetes mellitus, type II ([place])    Fatty liver    Fibrocystic breast 03/2007   Genital atrophy of female 2005   H/O varicella    H/O: menorrhagia 2011   Hx gestational diabetes 2002 and 2005    Hx of candidal vulvovaginitis    Hx: UTI (urinary tract infection)    Hyperlipemia    Sciatica    Vitamin D deficiency     PAST SURGICAL HISTORY: Past Surgical History:  Procedure Laterality Date   CESAREAN SECTION  98.02, 05    x 3   COSMETIC SURGERY  2019    FAMILY HISTORY: Family History  Problem Relation Age of Onset   Hypertension Mother    Heart disease Mother    Hyperlipidemia Mother    Depression Father    Hyperlipidemia Sister    Depression Sister    Hyperlipidemia Brother    Anxiety disorder Son    Hyperlipidemia Sister    Depression Sister    Hypertension Maternal Grandfather    Cancer Maternal Grandmother     SOCIAL HISTORY: Social History   Socioeconomic History   Marital status: Legally Separated    Spouse name: Not on file   Number of children: 3   Years of education: bachelor + 2   Highest education level: Not on file  Occupational History   Occupation: Pre K Pharmacist, 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    Comment: MIRENA placed in 2012   Other Topics Concern   Not on file  Social History Narrative   Lives with 3 sons.    Mom visits from [place].    Caffeine- coffee, 1 daily   Social Determinants of Health   Financial Resource Strain: Not on file  Food Insecurity: Food Insecurity Present (04/21/2020)   Hunger Vital Sign    Worried About Running Out of Food in the Last Year: Sometimes true    Ran Out of Food in the Last Year: Sometimes true  Transportation Needs: No Transportation Needs (04/21/2020)   PRAPARE - Hydrologist (Medical): No    Lack of Transportation (Non-Medical): No  Physical Activity: Not on file  Stress: Not on file  Social Connections: Not on file  Intimate Partner  Violence: Not on file     PHYSICAL EXAM  GENERAL EXAM/CONSTITUTIONAL: Vitals:  Vitals:   07/25/22 1104  BP: 116/78  Pulse: 67  Weight: 165 lb 4 oz (75 kg)  Height: _0  (1.6 m)   Body mass index is 29.27 kg/m. Wt Readings from Last 3 Encounters:  07/25/22 165 lb 4 oz (75 kg)  01/29/22 162 lb (73.5 kg)  10/26/21 171 lb 6.4 oz (77.7 kg)   Patient is in no distress; well developed, nourished and groomed; neck is supple  CARDIOVASCULAR: Examination of carotid arteries  is normal; no carotid bruits Regular rate and rhythm, no murmurs Examination of peripheral vascular system by observation and palpation is normal  EYES: Ophthalmoscopic exam of optic discs and posterior segments is normal; no papilledema or hemorrhages No results found.  MUSCULOSKELETAL: Gait, strength, tone, movements noted in Neurologic exam below  NEUROLOGIC: MENTAL STATUS:      No data to display         awake, alert, oriented to person, place and time recent and remote memory intact normal attention and concentration language fluent, comprehension intact, naming intact fund of knowledge appropriate  CRANIAL NERVE:  2nd - no papilledema on fundoscopic exam 2nd, 3rd, 4th, 6th - pupils equal and reactive to light, visual fields full to confrontation, extraocular muscles intact, no nystagmus 5th - facial sensation symmetric 7th - facial strength symmetric 8th - hearing intact 9th - palate elevates symmetrically, uvula midline 11th - shoulder shrug symmetric 12th - tongue protrusion midline  MOTOR:  normal bulk and tone, full strength in the BUE, BLE  SENSORY:  normal and symmetric to light touch, temperature, vibration; SLIGHTLY REDUCED PINPRICK IN BILATERAL HEELS  COORDINATION:  finger-nose-finger, fine finger movements normal  REFLEXES:  deep tendon reflexes present and symmetric  GAIT/STATION:  narrow based gait; romberg is negative     DIAGNOSTIC DATA (LABS, IMAGING,  TESTING) - I reviewed patient records, labs, notes, testing and imaging myself where available.  Lab Results  Component Value Date   WBC 7.2 12/22/2019   HGB 14.5 12/22/2019   HCT 44.3 12/22/2019   MCV 90 12/22/2019   PLT 242 12/22/2019      Component Value Date/Time   NA 139 10/26/2021 1627   NA 140 12/22/2019 0916   K 3.9 10/26/2021 1627   CL 102 10/26/2021 1627   CO2 27 10/26/2021 1627   GLUCOSE 77 10/26/2021 1627   BUN 12 10/26/2021 1627   BUN 15 12/22/2019 0916   CREATININE 0.59 10/26/2021 1627   CREATININE 0.62 12/29/2015 1218   CALCIUM 9.5 10/26/2021 1627   PROT 6.8 10/26/2021 1627   PROT 6.8 12/22/2019 0916   ALBUMIN 4.5 10/26/2021 1627   ALBUMIN 4.5 12/22/2019 0916   AST 15 10/26/2021 1627   ALT 21 10/26/2021 1627   ALKPHOS 54 10/26/2021 1627   BILITOT 0.4 10/26/2021 1627   BILITOT 0.7 12/22/2019 0916   GFRNONAA 106 12/22/2019 0916   GFRNONAA >89 12/29/2015 1218   GFRAA 123 12/22/2019 0916   GFRAA >89 12/29/2015 1218   Lab Results  Component Value Date   CHOL 149 10/26/2021   HDL 39.40 10/26/2021   LDLCALC 75 10/26/2021   TRIG 170.0 (H) 10/26/2021   CHOLHDL 4 10/26/2021   Lab Results  Component Value Date   HGBA1C 6.0 (A) 01/29/2022   Lab Results  Component Value Date   VITAMINB12 637 12/24/2016   Lab Results  Component Value Date   TSH 0.664 04/21/2020    MRI lumbar spine [I reviewed images myself and agree with interpretation. -VRP] 1. Symptomatic level favored to be L3-L4 where a broad-based left foraminal disc herniation results in up to moderate left foraminal stenosis. Correlate for associated left L3 radiculitis. 2. Chronic disc and endplate degeneration at L5-S1 in the setting of mild chronic retrolisthesis. At the disc space level both lateral recesses are mildly affected, but there is a superimposed right side caudal disc extrusion or small sequestered fragment suspected displacing the descending right S1 nerve roots. See series  500, image 31.  ASSESSMENT AND PLAN  46 y.o. year old female here with mild numbness in bilateral heels, following prolonged possible compression due to cross leg position during prolonged sleep following surgery in August 2019.    Dx:  1. Burning feet syndrome   2. Pain in both feet      PLAN:  PAIN IN FEET (combination of diabetic neuropathy / tendonitis / arthritis) - check neuropathy labs - continue diabetes control - continue gabapentin; titrate up as tolerated per PCP - other painful neuropathy treatment options: duloxetine 30-63m daily, amitriptyline 25-546mat bedtime, gabapentin 100-30059mhree times a day, pregabalin 75-150m59mice a day - capsaicin cream, lidocaine patch / cream, alpha-lipoic acid 600mg44mly  Orders Placed This Encounter  Procedures   Vitamin B12   Multiple Myeloma Panel (SPEP&IFE w/QIG)   ANA,IFA RA Diag Pnl w/rflx Tit/Patn   Vitamin B1   Vitamin B6   ANCA Profile   Angiotensin converting enzyme   Return for return to PCP, pending if symptoms worsen or fail to improve, pending test results.    VIKRAPenni Bombard10/[id number] in Neurology, Neurophysiology and Neuroimaging  [facility] [id number]te[place]en[place]we[phone]

## 2022-07-25 NOTE — Patient Instructions (Signed)
  PAIN IN FEET (combination of diabetic neuropathy / tendonitis / arthritis) - check neuropathy labs - continue diabetes control - continue gabapentin; titrate up as tolerated per PCP - other painful neuropathy treatment options: duloxetine 30-'60mg'$  daily, amitriptyline 25-'50mg'$  at bedtime, gabapentin 100-'300mg'$  three times a day, pregabalin 75-'150mg'$  twice a day - capsaicin cream, lidocaine patch / cream, alpha-lipoic acid '600mg'$  daily

## 2022-07-26 ENCOUNTER — Ambulatory Visit: Payer: Medicaid Other | Admitting: Diagnostic Neuroimaging

## 2022-07-26 DIAGNOSIS — J029 Acute pharyngitis, unspecified: Secondary | ICD-10-CM | POA: Diagnosis not present

## 2022-07-26 DIAGNOSIS — J069 Acute upper respiratory infection, unspecified: Secondary | ICD-10-CM | POA: Diagnosis not present

## 2022-07-27 ENCOUNTER — Encounter: Payer: Self-pay | Admitting: Diagnostic Neuroimaging

## 2022-08-01 ENCOUNTER — Ambulatory Visit: Payer: Medicaid Other | Admitting: Diagnostic Neuroimaging

## 2022-08-01 LAB — MULTIPLE MYELOMA PANEL, SERUM
Albumin SerPl Elph-Mcnc: 4.1 g/dL (ref 2.9–4.4)
Albumin/Glob SerPl: 1.4 (ref 0.7–1.7)
Alpha 1: 0.2 g/dL (ref 0.0–0.4)
Alpha2 Glob SerPl Elph-Mcnc: 0.7 g/dL (ref 0.4–1.0)
B-Globulin SerPl Elph-Mcnc: 1.1 g/dL (ref 0.7–1.3)
Gamma Glob SerPl Elph-Mcnc: 1 g/dL (ref 0.4–1.8)
Globulin, Total: 3.1 g/dL (ref 2.2–3.9)
IgA/Immunoglobulin A, Serum: 202 mg/dL (ref 87–352)
IgG (Immunoglobin G), Serum: 921 mg/dL (ref 586–1602)
IgM (Immunoglobulin M), Srm: 88 mg/dL (ref 26–217)
Total Protein: 7.2 g/dL (ref 6.0–8.5)

## 2022-08-01 LAB — VITAMIN B12: Vitamin B-12: 1201 pg/mL (ref 232–1245)

## 2022-08-01 LAB — ANA,IFA RA DIAG PNL W/RFLX TIT/PATN
ANA Titer 1: NEGATIVE
Cyclic Citrullin Peptide Ab: <1 units (ref 0–19)
Rheumatoid fact SerPl-aCnc: <10 IU/mL (ref <14.0)

## 2022-08-01 LAB — ANCA PROFILE
Anti-MPO Antibodies: <0.2 units (ref 0.0–0.9)
Anti-PR3 Antibodies: <0.2 units (ref 0.0–0.9)
Atypical pANCA: <1:20 {titer}
C-ANCA: <1:20 {titer}
P-ANCA: <1:20 {titer}

## 2022-08-01 LAB — VITAMIN B1: Thiamine: 135.4 nmol/L (ref 66.5–200.0)

## 2022-08-01 LAB — VITAMIN B6: Vitamin B6: 86 ug/L — ABNORMAL HIGH (ref 3.4–65.2)

## 2022-08-01 LAB — ANGIOTENSIN CONVERTING ENZYME: Angio Convert Enzyme: 56 U/L (ref 14–82)

## 2022-08-02 ENCOUNTER — Ambulatory Visit: Payer: Medicaid Other | Admitting: Internal Medicine

## 2022-08-02 ENCOUNTER — Encounter: Payer: Self-pay | Admitting: Internal Medicine

## 2022-08-02 VITALS — BP 120/78 | HR 76 | Ht 63.0 in | Wt 164.8 lb

## 2022-08-02 DIAGNOSIS — E161 Other hypoglycemia: Secondary | ICD-10-CM

## 2022-08-02 DIAGNOSIS — E663 Overweight: Secondary | ICD-10-CM

## 2022-08-02 DIAGNOSIS — E1165 Type 2 diabetes mellitus with hyperglycemia: Secondary | ICD-10-CM | POA: Diagnosis not present

## 2022-08-02 DIAGNOSIS — E559 Vitamin D deficiency, unspecified: Secondary | ICD-10-CM | POA: Diagnosis not present

## 2022-08-02 LAB — POCT GLYCOSYLATED HEMOGLOBIN (HGB A1C): Hemoglobin A1C: 6.3 % — AB (ref 4.0–5.6)

## 2022-08-02 MED ORDER — DAPAGLIFLOZIN PROPANEDIOL 10 MG PO TABS: 10.0000 mg | ORAL_TABLET | Freq: Every day | ORAL | 3 refills | Status: DC

## 2022-08-02 MED ORDER — METFORMIN HCL ER 500 MG PO TB24: 1000.0000 mg | ORAL_TABLET | Freq: Every day | ORAL | 3 refills | Status: DC

## 2022-08-02 NOTE — Progress Notes (Signed)
Patient ID: Jennifer Jones, female   DOB: December 01, 1975, 46 y.o.   MRN: [id number]  HPI: Suzetta Timko is a 46 y.o.-year-old female, returning for f/u for prediabetes, dx in 06/2011 - HbA1c was 6.4%, non-insulin-dependent, without long-term complications and also, idiopathic postprandial sd. She also saw Drs. Altheimer and Buddy Duty in the past. Last visit 6 months ago.  Interim history: No increased urination, blurry vision, nausea, chest pain. She developed numbness and discomfort in her feet since last visit.  She saw podiatry >> got steroid injections. She saw neurology >> extensive investigation showed a high vitamin B6. She was told to stop vitamins, animal organs, whole grains, fish, legumes. She was on Gabapentin x 1 mo and Diclofenac >> stopped. On Turmeric. Sxs are better.  Now only the right 2 toes are affected. She also had a sore throat recently >> was on ABx.  Diabetes: Reviewed HbA1c levels: Lab Results  Component Value Date   HGBA1C 6.0 (A) 01/29/2022   HGBA1C 6.9 (A) 10/26/2021   HGBA1C 6.4 (A) 06/15/2021  02/01/2021: HbA1c 6.5% 11/02/2020: HbA1c 6.2% Initial HbA1c was 6.4% >> lost 10 lbs >> HbA1c decreased.   She is on: -Metformin [phone] mg twice a day >> 500 mg once a day at night - Farxiga 5 mg before b'fast - started 11/2021 >> 10 mg daily We tried Ozempic 0.25 mg in 12/2020 - not covered by insurance.  She tried Metformin ER >> no changes. We tried acarbose 25 mg before breakfast but she developed bloating and could not tolerate it. She tried Trulicity but developed an itching rash and had to stop.  Pt checks her sugars 1-3 times a day: - am: 118-124 >> 130s >> 130-164 >> 110-124 >> 125-130 - 2h after b'fast: 160-210 or 70s (Metformin with B'fast)  >> 170-200 >> n/c - before lunch: n/c >> n/c - 2h after lunch: n/c >> 80,120-140 >> n/c  - before dinner: 90-110 >> 90-99 >> n/c  - 2h after dinner: 120-140 >> 180-190 >> 200s >> 116-198 >> 150-160 - bedtime: n/c >>  116-120 - nighttime: n/c No lows. Lowest sugar was 70s >> 130 >> 98; she has hypoglycemia awareness in the 70s. Highest sugar was 250 >> 210 >> 200s >> 198  Glucometer: Accu-Chek.  Pt's meals are: - Breakfast: whole wheat + olive oil, avocado + eggs - Lunch: sandwich  - Dinner: meat + starch + veggies - Snacks: nuts, fruit No regular exercise but stays active. She does not drink sweet drinks. She saw nutrition in the past.  -No CKD, last BUN/creatinine:  Lab Results  Component Value Date   BUN 12 10/26/2021   CREATININE 0.59 10/26/2021   -+ HL; last set of lipids: Lab Results  Component Value Date   CHOL 149 10/26/2021   HDL 39.40 10/26/2021   LDLCALC 75 10/26/2021   TRIG 170.0 (H) 10/26/2021   CHOLHDL 4 10/26/2021  07/27/2020: 160/116/37/101 She continues on simvastatin 20 mg daily, with occasional muscle aches.   She has fatty liver per right upper quadrant ultrasound (12/28/2013): The liver demonstrates coarse echotexture and increased echogenicity, likely reflecting diffuse steatosis. No overt cirrhotic contour abnormalities or focal lesions are identified. There is no evidence of intrahepatic biliary ductal dilatation. The portal vein is open.   IMPRESSION: Findings consistent with hepatic steatosis.   She had an EGD in 2022 >> stomach ulcers. On Protonix.  - last eye exam was in 12/2020: No DR reportedly.  - no numbness and tingling in her feet.  Idiopathic postprandial sd.  Reviewed  history: At last visit, she complained of sxs of hypoglycemia without low CBG readings starting 2012.   During this episodes, she felt very fatigued, tremulous, 45 minutes to 2 hours after eating, especially after breakfast.  The episodes were not improving with glucose intake.  Sugars normal during the episodes.    Interestingly, she had a low blood sugar of 45 at one-point but without associated symptoms.  This happened after she increase the Metformin dose to 1000 mg twice  a day, now a lower dose.  At last visit, continued to have lows in the middle of the day when taking Metformin in am.  We stopped the morning dose of metformin.  No low blood sugars anymore.  History vitamin D deficiency:  Previously on 2000 units daily, but we increased the dose to 4000 units daily in 2016. Then on 5000 units daily >>, she tells me that she stopped 1 mo ago as she was taking too many medications at that time.  Review levels: Lab Results  Component Value Date   VD25OH 36.32 10/26/2021   VD25OH 39.5 12/15/2020   VD25OH 42.2 09/17/2019   VD25OH 28.1 (L) 01/29/2017   VD25OH 39 09/23/2014   VD25OH 47 06/04/2014   05/19/2020: TSH 0.59  ROS: Constitutional:  + See HPI  I reviewed pt's medications, allergies, PMH, social hx, family hx, and changes were documented in the history of present illness. Otherwise, unchanged from my initial visit note.  Past Medical History:  Diagnosis Date   Anxiety    Bulging lumbar disc    Diabetes mellitus, type II ([place])    Fatty liver    Fibrocystic breast 03/2007   Genital atrophy of female 2005   H/O varicella    H/O: menorrhagia 2011   Hx gestational diabetes 2002 and 2005   Hx of candidal vulvovaginitis    Hx: UTI (urinary tract infection)    Hyperlipemia    Sciatica    Vitamin D deficiency    Past Surgical History:  Procedure Laterality Date   CESAREAN SECTION  98.02, 05    x 3   COSMETIC SURGERY  2019   History   Social History   Marital Status: Married    Spouse Name: N/A   Number of Children: 3   Years of Education: bachelor    Occupational History   Pre K Administrator, arts    Social History Main Topics   Smoking status: Never Smoker    Smokeless tobacco: Never Used   Alcohol Use: No   Drug Use: No   Sexual Activity: Yes    Patent examiner Protection: IUD     Comment: MIRENA placed in 2012    Social History Narrative   Lives with 3 sons, husband.   Mom visits from [place].    Current  Outpatient Medications on File Prior to Visit  Medication Sig Dispense Refill   Blood Glucose Monitoring Suppl (TRUE METRIX METER) W/DEVICE KIT Used as instructed 1 kit 0   cholecalciferol (VITAMIN D) 1000 UNITS tablet Take 1,000 Units by mouth daily.      dapagliflozin propanediol (FARXIGA) 5 MG TABS tablet Take 1 tablet (5 mg total) by mouth daily before breakfast. 90 tablet 3   diclofenac (VOLTAREN) 75 MG EC tablet Take 1 tablet (75 mg total) by mouth 2 (two) times daily. 50 tablet 2   glucose blood (TRUE METRIX BLOOD GLUCOSE TEST) test strip Use as instructed 100  each 12   levonorgestrel (MIRENA) 20 MCG/24HR IUD 1 each by Intrauterine route once.     metFORMIN (GLUCOPHAGE) 1000 MG tablet Take 0.5 tablets (500 mg total) by mouth daily with supper. 45 tablet 3   naproxen (NAPROSYN) 500 MG tablet Take 1 tablet (500 mg total) by mouth 2 (two) times daily with a meal. 14 tablet 0   pantoprazole (PROTONIX) 40 MG tablet Take 40 mg by mouth daily.     simvastatin (ZOCOR) 10 MG tablet Take 1 tablet (10 mg total) by mouth at bedtime. 30 tablet 11   TRUEPLUS LANCETS 28G MISC Use as directed 100 each 12   No current facility-administered medications on file prior to visit.   Allergies  Allergen Reactions   Codeine Itching   Cyclobenzaprine     Other reaction(s): hives, with naprosyn   Darvocet [Propoxyphene N-Acetaminophen] Itching   Dilaudid [Hydromorphone Hcl] Itching   Dulaglutide     Other reaction(s): local reaction   Percocet [Oxycodone-Acetaminophen] Itching   Triamcinolone Rash    Cream caused rash   Family History  Problem Relation Age of Onset   Hypertension Mother    Heart disease Mother    Hyperlipidemia Mother    Depression Father    Hyperlipidemia Sister    Depression Sister    Hyperlipidemia Brother    Anxiety disorder Son    Hyperlipidemia Sister    Depression Sister    Hypertension Maternal Grandfather    Cancer Maternal Grandmother    PE: BP 120/78 (BP Location:  Right Arm, Patient Position: Sitting, Cuff Size: Normal)   Pulse 76   Ht 5' 3"  (1.6 m)   Wt 164 lb 12.8 oz (74.8 kg)   SpO2 92%   BMI 29.19 kg/m   Wt Readings from Last 3 Encounters:  08/02/22 164 lb 12.8 oz (74.8 kg)  07/25/22 165 lb 4 oz (75 kg)  01/29/22 162 lb (73.5 kg)   Constitutional: normal overweight, in NAD Eyes:  EOMI, no exophthalmos ENT: no neck masses, no cervical lymphadenopathy Cardiovascular: RRR, No MRG Respiratory: CTA B Musculoskeletal: no deformities Skin:no rashes Neurological: no tremor with outstretched hands Diabetic Foot Exam - Simple   Simple Foot Form Diabetic Foot exam was performed with the following findings: Yes 08/02/2022  3:42 PM  Visual Inspection No deformities, no ulcerations, no other skin breakdown bilaterally: Yes Sensation Testing Intact to touch and monofilament testing bilaterally: Yes Pulse Check Posterior Tibialis and Dorsalis pulse intact bilaterally: Yes Comments    ASSESSMENT: 1.  DM2, non-insulin-dependent  2. Idiopathic postprandial sd.  3. Vitamin D deficiency  4.  Overweight  PLAN:  1. Patient with history of prediabetes, with a new diagnosis of diabetes in 01/2021.  She is on metformin low-dose due to previous perceived low blood sugars on the medication.  Since she had mildly low blood sugars in the middle of the day, we moved metformin from breakfast to dinnertime.  No low blood sugars afterwards.  At last visit, sugars are well controlled, and at that time she was observing Ramadan.  She was off [place] and we discussed about restarting after conclusion of Ramadan.  She did well on Farxiga, losing 9 pounds after starting it.  Since last visit, she sent me a message about still high sugars on the lower dose of Farxiga so we increased the dose to 10 mg daily. -She had yeast infections in the past for which she took Diflucan and started terconazole per OB/GYN.  At last visit we discussed  that after she finished the daily  application to start half an applicator once a week for maintenance. -At today's visit, sugars are mostly at goal throughout the day but slightly higher in the target range in the morning.  She is tolerating [place] well.  We discussed about possibly increasing metformin at dinnertime from 500 to 1000 mg.  I suggested to switch to metformin ER for better tolerance.  She agrees with the plan. -I advised her to: Patient Instructions   Please change: - Metformin to ER formulation 1000 mg daily with dinner  Continue: - Farxiga 10 mg daily before b'fast    Please come back for a follow-up appointment in 6 months.  - we checked her HbA1c: 6.3% (slightly higher) - advised to check sugars at different times of the day - 1x a day, rotating check times - advised for yearly eye exams >> she is UTD - will check annual labs today per her preference - return to clinic in 6 months  2.  Idiopathic postprandial sd. -She has a history of postprandial fatigue and tremors, not associated with hypoglycemia -We tried acarbose but she did not tolerate it due to bloating and gas -She did see nutrition in the past -She was on the keto diet but she felt worse on this -She described lower blood sugars in the middle of the day after taking metformin in the morning.  We moved metformin to dinnertime.  No low blood sugars during the daytime anymore after this change.  3. Vitamin D deficiency  -At last visit she was on 5000 units vitamin D daily, but stopped approximately 1 month ago as she felt that she was taking too many medications at that time -Vitamin D level was normal at last check in 10/2021 -We will repeat this now, but we discussed that she most likely needs to restart this  4.  Obesity class I -She gained 7 pounds before last visit, previously gained 13. -We tried Trulicity to help with weight loss, but she could not tolerate it due to itching and burning rash. -Before last visit we were able to start  Farxiga, which should also help with weight loss.  We increased the dose since last visit -She gained 2 pounds since last visit  Component     Latest Ref Rng 08/02/2022  Glucose     70 - 99 mg/dL 92   BUN     6 - 23 mg/dL 15   Creatinine     0.40 - 1.20 mg/dL 0.65   Sodium     135 - 145 mEq/L 135   Potassium     3.5 - 5.1 mEq/L 4.1   Chloride     96 - 112 mEq/L 101   CO2     19 - 32 mEq/L 24   Calcium     8.4 - 10.5 mg/dL 9.7   Total Protein     6.0 - 8.3 g/dL 7.5   Albumin     3.5 - 5.2 g/dL 4.6   Total Bilirubin     0.2 - 1.2 mg/dL 0.4   Alkaline Phosphatase     39 - 117 U/L 48   AST     0 - 37 U/L 23   ALT     0 - 35 U/L 27   Triglycerides     0.0 - 149.0 mg/dL 147.0   HDL Cholesterol     >39.00 mg/dL 48.70   Hemoglobin A1C     4.0 -  5.6 % 6.3 !   GFR     >60.00 mL/min 103.92   Cholesterol     0 - 200 mg/dL 211 (H)   VLDL     0.0 - 40.0 mg/dL 29.4   LDL (calc)     0 - 99 mg/dL 133 (H)   Total CHOL/HDL Ratio 4   NonHDL 162.03   VITD     30.00 - 100.00 ng/mL 39.99   Microalb, Ur     0.0 - 1.9 mg/dL 1.2   Creatinine,U     mg/dL 55.0   MICROALB/CREAT RATIO     0.0 - 30.0 mg/g 2.2   Labs are normal with the exception of an elevated LDL which is quite higher than before.  I will check with her if she is taking the simvastatin daily.  Philemon Kingdom, MD PhD [facility]

## 2022-08-02 NOTE — Patient Instructions (Addendum)
  Please change: - Metformin to ER formulation 1000 mg daily with dinner  Continue: - Farxiga 10 mg daily before b'fast    Please come back for a follow-up appointment in 6 months.

## 2022-08-03 LAB — COMPREHENSIVE METABOLIC PANEL
ALT: 27 U/L (ref 0–35)
AST: 23 U/L (ref 0–37)
Albumin: 4.6 g/dL (ref 3.5–5.2)
Alkaline Phosphatase: 48 U/L (ref 39–117)
BUN: 15 mg/dL (ref 6–23)
CO2: 24 mEq/L (ref 19–32)
Calcium: 9.7 mg/dL (ref 8.4–10.5)
Chloride: 101 mEq/L (ref 96–112)
Creatinine, Ser: 0.65 mg/dL (ref 0.40–1.20)
GFR: 103.92 mL/min (ref >60.00)
Glucose, Bld: 92 mg/dL (ref 70–99)
Potassium: 4.1 mEq/L (ref 3.5–5.1)
Sodium: 135 mEq/L (ref 135–145)
Total Bilirubin: 0.4 mg/dL (ref 0.2–1.2)
Total Protein: 7.5 g/dL (ref 6.0–8.3)

## 2022-08-03 LAB — LIPID PANEL
Cholesterol: 211 mg/dL — ABNORMAL HIGH (ref 0–200)
HDL: 48.7 mg/dL (ref >39.00)
LDL Cholesterol: 133 mg/dL — ABNORMAL HIGH (ref 0–99)
NonHDL: 162.03
Total CHOL/HDL Ratio: 4
Triglycerides: 147 mg/dL (ref 0.0–149.0)
VLDL: 29.4 mg/dL (ref 0.0–40.0)

## 2022-08-03 LAB — VITAMIN D 25 HYDROXY (VIT D DEFICIENCY, FRACTURES): VITD: 39.99 ng/mL (ref 30.00–100.00)

## 2022-08-03 LAB — MICROALBUMIN / CREATININE URINE RATIO
Creatinine,U: 55 mg/dL
Microalb Creat Ratio: 2.2 mg/g (ref 0.0–30.0)
Microalb, Ur: 1.2 mg/dL (ref 0.0–1.9)

## 2022-09-21 DIAGNOSIS — E1169 Type 2 diabetes mellitus with other specified complication: Secondary | ICD-10-CM | POA: Diagnosis not present

## 2022-09-21 DIAGNOSIS — M79671 Pain in right foot: Secondary | ICD-10-CM | POA: Diagnosis not present

## 2022-09-21 DIAGNOSIS — G620 Drug-induced polyneuropathy: Secondary | ICD-10-CM | POA: Diagnosis not present

## 2022-10-02 ENCOUNTER — Other Ambulatory Visit: Payer: Self-pay | Admitting: Internal Medicine

## 2022-10-10 DIAGNOSIS — N951 Menopausal and female climacteric states: Secondary | ICD-10-CM | POA: Diagnosis not present

## 2022-10-10 DIAGNOSIS — M79671 Pain in right foot: Secondary | ICD-10-CM | POA: Diagnosis not present

## 2022-10-10 DIAGNOSIS — Z0000 Encounter for general adult medical examination without abnormal findings: Secondary | ICD-10-CM | POA: Diagnosis not present

## 2022-10-10 DIAGNOSIS — R300 Dysuria: Secondary | ICD-10-CM | POA: Diagnosis not present

## 2022-10-10 DIAGNOSIS — N925 Other specified irregular menstruation: Secondary | ICD-10-CM | POA: Diagnosis not present

## 2022-10-10 DIAGNOSIS — Z0001 Encounter for general adult medical examination with abnormal findings: Secondary | ICD-10-CM | POA: Diagnosis not present

## 2022-10-10 DIAGNOSIS — G5761 Lesion of plantar nerve, right lower limb: Secondary | ICD-10-CM | POA: Diagnosis not present

## 2022-10-10 DIAGNOSIS — R208 Other disturbances of skin sensation: Secondary | ICD-10-CM | POA: Diagnosis not present

## 2022-10-10 DIAGNOSIS — Z6829 Body mass index (BMI) 29.0-29.9, adult: Secondary | ICD-10-CM | POA: Diagnosis not present

## 2022-10-22 DIAGNOSIS — M79671 Pain in right foot: Secondary | ICD-10-CM | POA: Diagnosis not present

## 2022-10-22 DIAGNOSIS — M79672 Pain in left foot: Secondary | ICD-10-CM | POA: Diagnosis not present

## 2022-11-19 DIAGNOSIS — R200 Anesthesia of skin: Secondary | ICD-10-CM | POA: Diagnosis not present

## 2022-11-19 DIAGNOSIS — R208 Other disturbances of skin sensation: Secondary | ICD-10-CM | POA: Diagnosis not present

## 2023-02-01 ENCOUNTER — Ambulatory Visit: Payer: Medicaid Other | Admitting: Internal Medicine

## 2023-02-13 DIAGNOSIS — L650 Telogen effluvium: Secondary | ICD-10-CM | POA: Diagnosis not present

## 2023-02-19 ENCOUNTER — Encounter: Payer: Self-pay | Admitting: Internal Medicine

## 2023-02-19 ENCOUNTER — Ambulatory Visit (INDEPENDENT_AMBULATORY_CARE_PROVIDER_SITE_OTHER): Payer: BC Managed Care – PPO | Admitting: Internal Medicine

## 2023-02-19 VITALS — BP 124/82 | HR 79 | Ht 63.0 in | Wt 162.4 lb

## 2023-02-19 DIAGNOSIS — Z7984 Long term (current) use of oral hypoglycemic drugs: Secondary | ICD-10-CM | POA: Diagnosis not present

## 2023-02-19 DIAGNOSIS — E559 Vitamin D deficiency, unspecified: Secondary | ICD-10-CM

## 2023-02-19 DIAGNOSIS — E1165 Type 2 diabetes mellitus with hyperglycemia: Secondary | ICD-10-CM

## 2023-02-19 DIAGNOSIS — E663 Overweight: Secondary | ICD-10-CM

## 2023-02-19 DIAGNOSIS — E119 Type 2 diabetes mellitus without complications: Secondary | ICD-10-CM

## 2023-02-19 LAB — POCT GLYCOSYLATED HEMOGLOBIN (HGB A1C): Hemoglobin A1C: 6.6 % — AB (ref 4.0–5.6)

## 2023-02-19 MED ORDER — METFORMIN HCL 500 MG PO TABS: 500.0000 mg | ORAL_TABLET | Freq: Two times a day (BID) | ORAL | 3 refills | Status: DC

## 2023-02-19 NOTE — Patient Instructions (Addendum)
Please change: - Metformin 500 mg 2x a day  Continue: - Farxiga 10 mg daily before b'fast    Please come back for a follow-up appointment in 6 months.

## 2023-02-19 NOTE — Progress Notes (Signed)
Patient ID: Jennifer Jones, female   DOB: 1976/01/20, 47 y.o.   MRN: [id number]  HPI: Jennifer Jones is a 47 y.o.-year-old female, returning for f/u for prediabetes, dx in 06/2011 - HbA1c was 6.4%, non-insulin-dependent, without long-term complications and also, idiopathic postprandial sd. She also saw Drs. Altheimer and Sharl Ma in the past. Last visit 6 months ago.  Interim history: No increased urination, blurry vision, nausea, chest pain. However, she mentions increased appetite recently. She recently fasted for Ramadan for a whole month and stopped both of her medications during that time.  She did feel higher blood sugars when she was eating at night. She recently saw dermatology for losing her eyebrows.  Labs were checked and TSH was normal.  Reviewed the rest of the investigation and we will scan the records.  Diabetes: Reviewed HbA1c levels: Lab Results  Component Value Date   HGBA1C 6.3 (A) 08/02/2022   HGBA1C 6.0 (A) 01/29/2022   HGBA1C 6.9 (A) 10/26/2021  02/01/2021: HbA1c 6.5% 11/02/2020: HbA1c 6.2% Initial HbA1c was 6.4% >> lost 10 lbs >> HbA1c decreased.   She is on: -Metformin [phone] mg twice a day >> 500 mg once a day at night >> metformin ER 1000 mg with dinner - Farxiga 5 mg before b'fast - started 11/2021 >> 10 mg daily We tried Ozempic 0.25 mg in 12/2020 - not covered by insurance.  She tried Metformin ER >> no changes. We tried acarbose 25 mg before breakfast but she developed bloating and could not tolerate it. She tried Trulicity but developed an itching rash and had to stop.  Pt checks her sugars 1-3 times a day: - am: 118-124 >> 130s >> 130-164 >> 110-124 >> 125-130 >> 126-130 - 2h after b'fast: 160-210 or 70s (Metformin with B'fast)  >> 170-200 >> n/c - before lunch: n/c >> n/c - 2h after lunch: n/c >> 80,120-140 >> n/c  - before dinner: 90-110 >> 90-99 >> n/c  - 2h after dinner: 200s >> 116-198 >> 150-160 >> 170-180, 220, 240 - Ramadan - bedtime: n/c  >> 116-120 - nighttime: n/c No lows. Lowest sugar was 70s >> 130 >> 98 >> 120; she has hypoglycemia awareness in the 70s. Highest sugar was 200s >> 198 >> 240 (Ramadan, off meds)  Glucometer: Accu-Chek.  Pt's meals are: - Breakfast: whole wheat + olive oil, avocado + eggs - Lunch: sandwich  - Dinner: meat + starch + veggies - Snacks: nuts, fruit No regular exercise but stays active. She does not drink sweet drinks. She saw nutrition in the past.  -No CKD, last BUN/creatinine:  02/13/2023: 14/0.62, GFR 109, Glu 177 Lab Results  Component Value Date   BUN 15 08/02/2022   CREATININE 0.65 08/02/2022   -+ HL; last set of lipids: Lab Results  Component Value Date   CHOL 211 (H) 08/02/2022   HDL 48.70 08/02/2022   LDLCALC 133 (H) 08/02/2022   TRIG 147.0 08/02/2022   CHOLHDL 4 08/02/2022  07/27/2020: 160/116/37/101 She continues on simvastatin 20 mg daily, with occasional muscle aches.   She has fatty liver per right upper quadrant ultrasound (12/28/2013): The liver demonstrates coarse echotexture and increased echogenicity, likely reflecting diffuse steatosis. No overt cirrhotic contour abnormalities or focal lesions are identified. There is no evidence of intrahepatic biliary ductal dilatation. The portal vein is open.   IMPRESSION: Findings consistent with hepatic steatosis.   She had an EGD in 2022 >> stomach ulcers. Off Protonix now.  - last eye exam was in 2024: No DR  reportedly.  - no numbness and tingling in her feet.  Last foot exam 08/02/2022 here in clinic but she also saw Dr. Romualdo Bolk 11/19/2022 -reviewed evaluation: Palpable Dorsalis pedis and Posterior tibial pulses Immediate CFT to all toes Protective and light touch sensation are both intact Negative instability noted No palpable soft tissue mass is noted either No left 2nd and 3rd MTP joint instability is noted +5/5 DF/PF muscle strength is noted Zero degrees of passive right ankle joint dorsiflexion   Negative tinels sign with percussion of the deep peroneal nerve Antalgic gait during walking and standing in the office She developed numbness and discomfort in 2023. She saw podiatry >> got steroid injections. She saw neurology >> extensive investigation showed a high vitamin B6. She was told to stop vitamins, animal organs, whole grains, fish, legumes. She was on Gabapentin x 1 mo and Diclofenac >> stopped. On Turmeric. Sxs improved, with only the right 2 toes affected.  Idiopathic postprandial sd.  Reviewed  history: At last visit, she complained of sxs of hypoglycemia without low CBG readings starting 2012.   During this episodes, she felt very fatigued, tremulous, 45 minutes to 2 hours after eating, especially after breakfast.  The episodes were not improving with glucose intake.  Sugars normal during the episodes.    Interestingly, she had a low blood sugar of 45 at one-point but without associated symptoms.  This happened after she increase the Metformin dose to 1000 mg twice a day, now a lower dose.  At last visit, continued to have lows in the middle of the day when taking Metformin in am.  We stopped the morning dose of metformin.  No low blood sugars anymore.  History vitamin D deficiency:  Previously on 2000 units daily, but we increased the dose to 4000 units daily in 2016. Then on 5000 units daily >> stopped 06/2022 to decrease her medication burden. Now on 5000 units qod.  Review levels: Lab Results  Component Value Date   VD25OH 39.99 08/02/2022   VD25OH 36.32 10/26/2021   VD25OH 39.5 12/15/2020   VD25OH 42.2 09/17/2019   VD25OH 28.1 (L) 01/29/2017   VD25OH 39 09/23/2014   VD25OH 47 06/04/2014   02/13/2023: TSH 0.721 05/19/2020: TSH 0.59  02/13/2023: Ferritin 85  ROS: Constitutional:  + See HPI  I reviewed pt's medications, allergies, PMH, social hx, family hx, and changes were documented in the history of present illness. Otherwise, unchanged from my initial  visit note.  Past Medical History:  Diagnosis Date   Anxiety    Bulging lumbar disc    Diabetes mellitus, type II (HCC)    Fatty liver    Fibrocystic breast 03/2007   Genital atrophy of female 2005   H/O varicella    H/O: menorrhagia 2011   Hx gestational diabetes 2002 and 2005   Hx of candidal vulvovaginitis    Hx: UTI (urinary tract infection)    Hyperlipemia    Sciatica    Vitamin D deficiency    Past Surgical History:  Procedure Laterality Date   CESAREAN SECTION  98.02, 05    x 3   COSMETIC SURGERY  2019   History   Social History   Marital Status: Married    Spouse Name: N/A   Number of Children: 3   Years of Education: bachelor    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   Mom visits from [place].    Current Outpatient Medications on File Prior to Visit  Medication Sig Dispense Refill   Blood Glucose Monitoring Suppl (TRUE METRIX METER) W/DEVICE KIT Used as instructed 1 kit 0   cholecalciferol (VITAMIN D) 1000 UNITS tablet Take 1,000 Units by mouth daily.      dapagliflozin propanediol (FARXIGA) 10 MG TABS tablet Take 1 tablet (10 mg total) by mouth daily before breakfast. 90 tablet 3   diclofenac (VOLTAREN) 75 MG EC tablet Take 1 tablet (75 mg total) by mouth 2 (two) times daily. 50 tablet 2   glucose blood (TRUE METRIX BLOOD GLUCOSE TEST) test strip Use as instructed 100 each 12   levonorgestrel (MIRENA) 20 MCG/24HR IUD 1 each by Intrauterine route once.     metFORMIN (GLUCOPHAGE-XR) 500 MG 24 hr tablet Take 2 tablets (1,000 mg total) by mouth daily with supper. 180 tablet 3   naproxen (NAPROSYN) 500 MG tablet Take 1 tablet (500 mg total) by mouth 2 (two) times  daily with a meal. 14 tablet 0   pantoprazole (PROTONIX) 40 MG tablet Take 40 mg by mouth daily.     simvastatin (ZOCOR) 10 MG tablet Take 1 tablet (10 mg total) by mouth at bedtime. 30 tablet 11   TRUEPLUS LANCETS 28G MISC Use as directed 100 each 12   No current facility-administered medications on file prior to visit.   Allergies  Allergen Reactions   Codeine Itching   Cyclobenzaprine     Other reaction(s): hives, with naprosyn   Darvocet [Propoxyphene N-Acetaminophen] Itching   Dilaudid [Hydromorphone Hcl] Itching   Dulaglutide     Other reaction(s): local reaction   Percocet [Oxycodone-Acetaminophen] Itching   Triamcinolone Rash    Cream caused rash   Family History  Problem Relation Age of Onset   Hypertension Mother    Heart disease Mother    Hyperlipidemia Mother    Depression Father    Hyperlipidemia Sister    Depression Sister    Hyperlipidemia Brother    Anxiety disorder Son    Hyperlipidemia Sister    Depression Sister    Hypertension Maternal Grandfather    Cancer Maternal Grandmother    PE: BP 124/82 (BP Location: Right Arm, Patient Position: Sitting, Cuff Size: Normal)   Pulse 79   Ht 5\' 3"  (1.6 m)   Wt 162 lb 6.4 oz (73.7 kg)   SpO2 99%   BMI 28.77 kg/m   Wt Readings from Last 3 Encounters:  02/19/23 162 lb 6.4 oz (73.7 kg)  08/02/22 164 lb 12.8 oz (74.8 kg)  07/25/22 165 lb 4 oz (75 kg)   Constitutional: normal overweight, in NAD Eyes:  EOMI, no exophthalmos ENT: no neck masses, no cervical lymphadenopathy Cardiovascular: RRR, No MRG Respiratory: CTA B Musculoskeletal: no deformities Skin:no rashes Neurological: no tremor with outstretched hands  ASSESSMENT: 1.  DM2, non-insulin-dependent  2. Idiopathic postprandial sd.  3. Vitamin D deficiency  4.  Overweight  PLAN:  1. Patient with history of prediabetes, with a new diagnosis of diabetes in 01/2021.  She is on metformin low-dose due to previously perceived low blood sugars on the  medication.  She had mildly low blood sugars in the middle  of the day so we moved metformin from breakfast to dinnertime with no low blood sugars afterwards.  At last visit, we discussed about possibly increasing the metformin dose from 500 to 1000 mg daily as sugars are slightly higher than target in the morning.  For better tolerance, I suggested the ER metformin formulation.  We continued her [place].  HbA1c at that time was higher, at 6.3%, but still at goal. -Of note, she had previous yeast infections and she was on Diflucan and then on terconazole per OB/GYN; we discussed that after she finished the daily application to start half an applicator once a week for maintenance. -At today's visit, she complains of appetite and cravings.  I feel that this may be related to taking the entire metformin dose at night but she feels that the extended release formulation is not working as well for her.  Therefore, we will go back to the metformin 500 mg twice a day, in the instance release formulation.  I advised her that if sugars still high after dinner in the morning we could increase her evening dose to 1000 mg.  Will continue the same dose of Farxiga for now. -I advised her to: Patient Instructions  Please change: - Metformin 500 mg 2x a day  Continue: - Farxiga 10 mg daily before b'fast    Please come back for a follow-up appointment in 6 months.  - we checked her HbA1c: 6.6% (higher) - advised to check sugars at different times of the day - 1x a day, rotating check times - advised for yearly eye exams >> she is UTD - return to clinic in 6 months  2.  Idiopathic postprandial sd. -She has a history of postprandial fatigue and tremors, not associated with hypoglycemia -We tried acarbose but she did not tolerate it due to bloating and gas -She saw nutrition in the past -She was previously on a keto diet but she felt worse on this -She described lower blood sugars in the middle of the day after  taking metformin in the morning.  We moved metformin to dinnertime and she had no more low blood during the day  3. Vitamin D deficiency  -She was previously on 5000 units vitamin D daily, however, at last visit, she was off vitamin D to reduce her medication burden  -she is back on 5000 units approximately every other day now -Vitamin D was normal at that time: Lab Results  Component Value Date   VD25OH 39.99 08/02/2022   4.  Overweight -We tried Trulicity to help with weight loss, but she could not tolerate it due to itching and burning rash. -she continues on Farxiga  -Per our scale, she lost 3 pounds since last OV -Will go back to taking metformin in the morning to hopefully help with decreasing appetite  Carlus Pavlov, MD PhD [facility] Endocr[facility]

## 2023-04-14 ENCOUNTER — Other Ambulatory Visit: Payer: Self-pay | Admitting: Internal Medicine

## 2023-04-16 DIAGNOSIS — R300 Dysuria: Secondary | ICD-10-CM | POA: Diagnosis not present

## 2023-04-16 DIAGNOSIS — N760 Acute vaginitis: Secondary | ICD-10-CM | POA: Diagnosis not present

## 2023-04-16 DIAGNOSIS — E1169 Type 2 diabetes mellitus with other specified complication: Secondary | ICD-10-CM | POA: Diagnosis not present

## 2023-06-25 DIAGNOSIS — K760 Fatty (change of) liver, not elsewhere classified: Secondary | ICD-10-CM | POA: Diagnosis not present

## 2023-07-09 DIAGNOSIS — T8389XA Other specified complication of genitourinary prosthetic devices, implants and grafts, initial encounter: Secondary | ICD-10-CM | POA: Diagnosis not present

## 2023-07-09 DIAGNOSIS — N925 Other specified irregular menstruation: Secondary | ICD-10-CM | POA: Diagnosis not present

## 2023-07-09 DIAGNOSIS — Z3040 Encounter for surveillance of contraceptives, unspecified: Secondary | ICD-10-CM | POA: Diagnosis not present

## 2023-07-09 DIAGNOSIS — N644 Mastodynia: Secondary | ICD-10-CM | POA: Diagnosis not present

## 2023-07-09 DIAGNOSIS — Z30432 Encounter for removal of intrauterine contraceptive device: Secondary | ICD-10-CM | POA: Diagnosis not present

## 2023-07-09 DIAGNOSIS — Z30011 Encounter for initial prescription of contraceptive pills: Secondary | ICD-10-CM | POA: Diagnosis not present

## 2023-07-19 DIAGNOSIS — N854 Malposition of uterus: Secondary | ICD-10-CM | POA: Diagnosis not present

## 2023-07-19 DIAGNOSIS — Z30430 Encounter for insertion of intrauterine contraceptive device: Secondary | ICD-10-CM | POA: Diagnosis not present

## 2023-08-03 DIAGNOSIS — J029 Acute pharyngitis, unspecified: Secondary | ICD-10-CM | POA: Diagnosis not present

## 2023-08-03 DIAGNOSIS — Z03818 Encounter for observation for suspected exposure to other biological agents ruled out: Secondary | ICD-10-CM | POA: Diagnosis not present

## 2023-08-09 DIAGNOSIS — H5213 Myopia, bilateral: Secondary | ICD-10-CM | POA: Diagnosis not present

## 2023-08-12 DIAGNOSIS — K649 Unspecified hemorrhoids: Secondary | ICD-10-CM | POA: Diagnosis not present

## 2023-08-12 DIAGNOSIS — Z6829 Body mass index (BMI) 29.0-29.9, adult: Secondary | ICD-10-CM | POA: Diagnosis not present

## 2023-08-14 DIAGNOSIS — B3731 Acute candidiasis of vulva and vagina: Secondary | ICD-10-CM | POA: Diagnosis not present

## 2023-08-14 DIAGNOSIS — N925 Other specified irregular menstruation: Secondary | ICD-10-CM | POA: Diagnosis not present

## 2023-08-14 DIAGNOSIS — Z30431 Encounter for routine checking of intrauterine contraceptive device: Secondary | ICD-10-CM | POA: Diagnosis not present

## 2023-08-22 ENCOUNTER — Encounter: Payer: Self-pay | Admitting: Internal Medicine

## 2023-08-22 ENCOUNTER — Ambulatory Visit (INDEPENDENT_AMBULATORY_CARE_PROVIDER_SITE_OTHER): Payer: BC Managed Care – PPO | Admitting: Internal Medicine

## 2023-08-22 VITALS — BP 115/80 | HR 68 | Ht 63.0 in | Wt 167.0 lb

## 2023-08-22 DIAGNOSIS — E1165 Type 2 diabetes mellitus with hyperglycemia: Secondary | ICD-10-CM

## 2023-08-22 DIAGNOSIS — Z794 Long term (current) use of insulin: Secondary | ICD-10-CM

## 2023-08-22 DIAGNOSIS — E559 Vitamin D deficiency, unspecified: Secondary | ICD-10-CM | POA: Diagnosis not present

## 2023-08-22 DIAGNOSIS — E161 Other hypoglycemia: Secondary | ICD-10-CM | POA: Diagnosis not present

## 2023-08-22 DIAGNOSIS — E663 Overweight: Secondary | ICD-10-CM | POA: Diagnosis not present

## 2023-08-22 LAB — POCT GLYCOSYLATED HEMOGLOBIN (HGB A1C): Hemoglobin A1C: 6.4 % — AB (ref 4.0–5.6)

## 2023-08-22 NOTE — Patient Instructions (Addendum)
You can stop [place].  Continue: - Metformin 500 mg 2x a day.  Please start: - Ozempic 0.25 mg weekly in a.m. (for example on Sunday morning) x 4 weeks, then increase to 0.5 mg weekly in a.m. if no nausea or hypoglycemia.   Please return in 3-4 months with your sugar log.

## 2023-08-22 NOTE — Progress Notes (Addendum)
Patient ID: Jennifer Jones, female   DOB: 15-Feb-1976, 47 y.o.   MRN: [id number]  HPI: Jennifer Jones is a 47 y.o.-year-old female, returning for f/u for prediabetes, dx in 06/2011 - HbA1c was 6.4%, non-insulin-dependent, without long-term complications and also, idiopathic postprandial sd. She also saw Drs. Altheimer and Sharl Ma in the past. Last visit 6 months ago.  Interim history: No increased urination, blurry vision, nausea, chest pain. She still has increased appetite and feels like she needs to eat throughout the day. She gets yeast infections. She was treated by ObGyn.  However, she is interested in coming off the [place]. At last visit, she had increased foot pain, but this resolved.  She saw neurology.  Diabetes: Reviewed HbA1c levels: 04/16/2023: HbA1c 6.3%  Lab Results  Component Value Date   HGBA1C 6.6 (A) 02/19/2023   HGBA1C 6.3 (A) 08/02/2022   HGBA1C 6.0 (A) 01/29/2022  02/01/2021: HbA1c 6.5% 11/02/2020: HbA1c 6.2% Initial HbA1c was 6.4% >> lost 10 lbs >> HbA1c decreased.   She is on: -Metformin 226 702 2381 mg twice a day >> 500 mg once a day at night >> metformin ER 1000 mg with dinner >> 500 mg twice a day - Farxiga 5 mg before b'fast - started 11/2021 >> 10 mg daily We tried Ozempic 0.25 mg in 12/2020 - not covered by insurance.  She tried Metformin ER >> no changes. We tried acarbose 25 mg before breakfast but she developed bloating and could not tolerate it. She tried Trulicity but developed an itching rash and had to stop.  Pt checks her sugars 1-3 times a day: - am: 130-164 >> 110-124 >> 125-130 >> 126-130 >> 125-140 - 2h after b'fast: 160-210 or 70s (Metformin with B'fast) >> 170-200 >> n/c - before lunch: n/c >> n/c >> 108 - 2h after lunch: n/c >> 80,120-140 >> n/c  - before dinner: 90-110 >> 90-99 >> n/c  - 2h after dinner: 150-160 >> 170-180, 220, 240 - Ramadan >> 160-170 - bedtime: n/c >> 116-120 - nighttime: n/c No lows. Lowest sugar was 70s >> 130 >> 98  >> 120 >> 108; she has hypoglycemia awareness in the 70s. Highest sugar was 200s >> 198 >> 240 (Ramadan, off meds) >> 170  Glucometer: Accu-Chek.  Pt's meals are: - Breakfast: whole wheat + olive oil, avocado + eggs - Lunch: sandwich  - Dinner: meat + starch + veggies - Snacks: nuts, fruit No regular exercise but stays active. She does not drink sweet drinks. She saw nutrition in the past.  -No CKD, last BUN/creatinine:  02/13/2023: 14/0.62, GFR 109, Glu 177 Lab Results  Component Value Date   BUN 15 08/02/2022   CREATININE 0.65 08/02/2022   Lab Results  Component Value Date   MICRALBCREAT 2.2 08/02/2022   MICRALBCREAT 1.2 10/26/2021   MICRALBCREAT 8 04/19/2016   -+ HL; last set of lipids: Lab Results  Component Value Date   CHOL 211 (H) 08/02/2022   HDL 48.70 08/02/2022   LDLCALC 133 (H) 08/02/2022   TRIG 147.0 08/02/2022   CHOLHDL 4 08/02/2022  She continues on simvastatin 20 mg daily, with occasional muscle aches.   She has fatty liver per right upper quadrant ultrasound (12/28/2013): The liver demonstrates coarse echotexture and increased echogenicity, likely reflecting diffuse steatosis. No overt cirrhotic contour abnormalities or focal lesions are identified. There is no evidence of intrahepatic biliary ductal dilatation. The portal vein is open.   IMPRESSION: Findings consistent with hepatic steatosis.   She had an EGD in 2022 >>  stomach ulcers. Off Protonix now.  - last eye exam was in 2024: No DR reportedly.  - no numbness and tingling in her feet.  Last foot exam 08/02/2022 here in clinic but she also saw Dr. Romualdo Bolk 11/19/2022 -reviewed evaluation: Palpable Dorsalis pedis and Posterior tibial pulses Immediate CFT to all toes Protective and light touch sensation are both intact Negative instability noted No palpable soft tissue mass is noted either No left 2nd and 3rd MTP joint instability is noted +5/5 DF/PF muscle strength is noted Zero degrees of  passive right ankle joint dorsiflexion  Negative tinels sign with percussion of the deep peroneal nerve Antalgic gait during walking and standing in the office She developed numbness and discomfort in 2023. She saw podiatry >> got steroid injections. She saw neurology >> extensive investigation showed a high vitamin B6. She was told to stop vitamins, animal organs, whole grains, fish, legumes. She was on Gabapentin x 1 mo and Diclofenac >> stopped. On Turmeric. Sxs improved, with only the right 2 toes affected.  Idiopathic postprandial sd.  Reviewed  history: At last visit, she complained of sxs of hypoglycemia without low CBG readings starting 2012.   During this episodes, she felt very fatigued, tremulous, 45 minutes to 2 hours after eating, especially after breakfast.  The episodes were not improving with glucose intake.  Sugars normal during the episodes.    Interestingly, she had a low blood sugar of 45 at one-point but without associated symptoms.  This happened after she increase the Metformin dose to 1000 mg twice a day, but then switched to a lower dose.  No low blood sugars anymore.  History vitamin D deficiency:  Previously on 2000 units daily, but we increased the dose to 4000 units daily in 2016. Then on 5000 units daily >> stopped 06/2022 to decrease her medication burden. Now on ~5000 units qod.  Review levels: Lab Results  Component Value Date   VD25OH 39.99 08/02/2022   VD25OH 36.32 10/26/2021   VD25OH 39.5 12/15/2020   VD25OH 42.2 09/17/2019   VD25OH 28.1 (L) 01/29/2017   VD25OH 39 09/23/2014   VD25OH 47 06/04/2014   02/13/2023: TSH 0.721 05/19/2020: TSH 0.59  02/13/2023: Ferritin 85  ROS: Constitutional:  + See HPI  I reviewed pt's medications, allergies, PMH, social hx, family hx, and changes were documented in the history of present illness. Otherwise, unchanged from my initial visit note.  Past Medical History:  Diagnosis Date   Anxiety    Bulging  lumbar disc    Diabetes mellitus, type II (HCC)    Fatty liver    Fibrocystic breast 03/2007   Genital atrophy of female 2005   H/O varicella    H/O: menorrhagia 2011   Hx gestational diabetes 2002 and 2005   Hx of candidal vulvovaginitis    Hx: UTI (urinary tract infection)    Hyperlipemia    Sciatica    Vitamin D deficiency    Past Surgical History:  Procedure Laterality Date   CESAREAN SECTION  98.02, 05    x 3   COSMETIC SURGERY  2019   History   Social History   Marital Status: Married    Spouse Name: N/A   Number of Children: 3   Years of Education: bachelor    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   Mom visits from [place].    Current Outpatient Medications on File Prior to Visit  Medication Sig Dispense Refill   Blood Glucose Monitoring Suppl (TRUE METRIX METER) W/DEVICE KIT Used as instructed 1 kit 0   cholecalciferol (VITAMIN D) 1000 UNITS tablet Take 1,000 Units by mouth daily.      dapagliflozin propanediol (FARXIGA) 10 MG TABS tablet Take 1 tablet (10 mg total) by mouth daily before breakfast. 90 tablet 3   glucose blood (TRUE METRIX BLOOD GLUCOSE TEST) test strip Use as instructed 100 each 12   levonorgestrel (MIRENA) 20 MCG/24HR IUD 1 each by Intrauterine route once.     metFORMIN (GLUCOPHAGE) 500 MG tablet Take 1 tablet (500 mg total) by mouth 2 (two) times daily with a meal. 180 tablet 3   naproxen (NAPROSYN) 500 MG tablet Take 1 tablet (500 mg total) by mouth 2 (two) times daily with a meal. 14 tablet 0   simvastatin (ZOCOR) 10 MG tablet Take 1 tablet (10 mg total) by mouth at bedtime. 30 tablet 11   TRUEPLUS LANCETS 28G MISC Use as directed 100 each 12    No current facility-administered medications on file prior to visit.   Allergies  Allergen Reactions   Codeine Itching   Cyclobenzaprine     Other reaction(s): hives, with naprosyn   Darvocet [Propoxyphene N-Acetaminophen] Itching   Dilaudid [Hydromorphone Hcl] Itching   Dulaglutide     Other reaction(s): local reaction   Percocet [Oxycodone-Acetaminophen] Itching   Triamcinolone Rash    Cream caused rash   Family History  Problem Relation Age of Onset   Hypertension Mother    Heart disease Mother    Hyperlipidemia Mother    Depression Father    Hyperlipidemia Sister    Depression Sister    Hyperlipidemia Brother    Anxiety disorder Son    Hyperlipidemia Sister    Depression Sister    Hypertension Maternal Grandfather    Cancer Maternal Grandmother    PE: BP 115/80   Pulse 68   Ht 5\' 3"  (1.6 m)   Wt 167 lb (75.8 kg)   SpO2 99%   BMI 29.58 kg/m   Wt Readings from Last 3 Encounters:  08/22/23 167 lb (75.8 kg)  02/19/23 162 lb 6.4 oz (73.7 kg)  08/02/22 164 lb 12.8 oz (74.8 kg)   Constitutional: normal overweight, in NAD Eyes:  EOMI, no exophthalmos ENT: no neck masses, no cervical lymphadenopathy Cardiovascular: RRR, No MRG Respiratory: CTA B Musculoskeletal: no deformities Skin:no rashes Neurological: no tremor with outstretched hands  ASSESSMENT: 1.  DM2, non-insulin-dependent  2. Idiopathic postprandial sd.  3. Vitamin D deficiency  4.  Overweight  PLAN:  1. Patient with history of prediabetes, with a new diagnosis of diabetes in 01/2021.  She is on low-dose metformin and SGLT2 inhibitor with improving control.  She had an improved HbA1c of 6.3% on 04/16/2023.  At last visit, she was complaining of appetite and cravings and we split her metformin dose in 2.  We discussed that if sugars were still high after dinner, to increase his evening dose to 1000 mg daily. -At today's visit, I agree with her that sugars are higher than before despite metformin  and [place].  She has constant increased  appetite and also has yeast infections, which are treated by OB/GYN.  However, she would like to, [place].  Will go ahead and stop this and, per her preference, we will start Ozempic.  I recommended to start at a low dose and increase as tolerated. -I advised her to: Patient Instructions  You can stop [place].  Continue: - Metformin 500 mg 2x a day.  Please start: - Ozempic 0.25 mg weekly in a.m. (for example on "Sunday morning) x 4 weeks, then increase to 0.5 mg weekly in a.m. if no nausea or hypoglycemia.   Please return in 3-4 months with your sugar log.   - we checked her HbA1c: 6.4% (slightly higher) - advised to check sugars at different times of the day - 1x a day, rotating check times - advised for yearly eye exams >> she is UTD - return to clinic in 6 months  2.  Idiopathic postprandial sd. -She has a history of postprandial fatigue and tremors, not associated with hypoglycemia -We tried acarbose but she did not tolerate it due to bloating and gas -She saw nutrition in the past -Previously on a keto diet but she felt worse on this -She described lower blood sugars in the middle of the day after taking metformin in the morning.  We moved metformin to dinnertime and she had no more low blood sugars during the day.  However, at last visit, we again discussed about trying to moving it in the morning to hopefully curb her appetite during the day  3. Vitamin D deficiency  -She was previously on 5000 units vitamin D daily, however, at last visit, she was off vitamin D to reduce her medication burden  -she is back on 5000 units approximately every other day now -Emily was normal at last visit: Lab Results  Component Value Date   VD25OH 39.99 08/02/2022  -We will recheck the level today  4.  Overweight -We tried Trulicity to help with weight loss, but she could not tolerate it due to itching and burning rash. -He is currently on Farxiga, but  feels that this is not helping her.  She still has increased hunger and weight gain.  She is interested in coming off due to yeast infections, also. -She lost 3 pounds before last visit, but she gained 5 pounds since then -We moved her metformin in the morning at last visit to help with increased appetite during the day.  She tried this without results.  Over the summer, she also tries to take 1000 mg twice a day, but still with higher blood sugars and increased appetite. -Per her request, we will try Ozempic.  At today's visit we discussed about correct injection techniques and how to titrate the dose up.  Also discussed about possible side effects.  Component     Latest Ref Rng 08/22/2023  Glucose     70 - 99 mg/dL 109 (H)   BUN     6 - 23 mg/dL 18   Creatinine     0.40 - 1.20 mg/dL 0.85   Sodium     135 - 145 mEq/L 137   Potassium     3.5 - 5.1 mEq/L 3.9   Chloride     96 - 112 mEq/L 101   CO2     19"  - 32 mEq/L 27   Calcium     8.4 - 10.5 mg/dL 9.7   Total Protein     6.0 - 8.3 g/dL 7.1   Albumin  3.5 - 5.2 g/dL 4.4   Total Bilirubin     0.2 - 1.2 mg/dL 0.3   Alkaline Phosphatase     39 - 117 U/L 48   AST     0 - 37 U/L 17   ALT     0 - 35 U/L 24   Triglycerides     0.0 - 149.0 mg/dL 161.0 (H)   HDL Cholesterol     >39.00 mg/dL 96.04   Hemoglobin V4U     4.0 - 5.6 % 6.4 !   GFR     >60.00 mL/min 80.27   Cholesterol     0 - 200 mg/dL 981   VLDL     0.0 - 19.1 mg/dL 47.8 (H)   LDL (calc)     0 - 99 mg/dL 88   Total CHOL/HDL Ratio 4   NonHDL 128.02   VITD     30.00 - 100.00 ng/mL 40.93   Microalb, Ur     0.0 - 1.9 mg/dL 0.8   Creatinine,U     mg/dL 29.5   MICROALB/CREAT RATIO     0.0 - 30.0 mg/g 1.4   ACR, vitamin D, lipids at goal with the exception of a high triglyceride level.  CMP normal except for the slightly high glucose.  Carlus Pavlov, MD PhD [facility]

## 2023-08-23 LAB — VITAMIN D 25 HYDROXY (VIT D DEFICIENCY, FRACTURES): VITD: 40.93 ng/mL (ref 30.00–100.00)

## 2023-08-23 LAB — COMPREHENSIVE METABOLIC PANEL
ALT: 24 U/L (ref 0–35)
AST: 17 U/L (ref 0–37)
Albumin: 4.4 g/dL (ref 3.5–5.2)
Alkaline Phosphatase: 48 U/L (ref 39–117)
BUN: 18 mg/dL (ref 6–23)
CO2: 27 meq/L (ref 19–32)
Calcium: 9.7 mg/dL (ref 8.4–10.5)
Chloride: 101 meq/L (ref 96–112)
Creatinine, Ser: 0.85 mg/dL (ref 0.40–1.20)
GFR: 80.27 mL/min (ref >60.00)
Glucose, Bld: 109 mg/dL — ABNORMAL HIGH (ref 70–99)
Potassium: 3.9 meq/L (ref 3.5–5.1)
Sodium: 137 meq/L (ref 135–145)
Total Bilirubin: 0.3 mg/dL (ref 0.2–1.2)
Total Protein: 7.1 g/dL (ref 6.0–8.3)

## 2023-08-23 LAB — LIPID PANEL
Cholesterol: 169 mg/dL (ref 0–200)
HDL: 40.8 mg/dL
LDL Cholesterol: 88 mg/dL (ref 0–99)
NonHDL: 128.02
Total CHOL/HDL Ratio: 4
Triglycerides: 201 mg/dL — ABNORMAL HIGH (ref 0.0–149.0)
VLDL: 40.2 mg/dL — ABNORMAL HIGH (ref 0.0–40.0)

## 2023-08-23 LAB — MICROALBUMIN / CREATININE URINE RATIO
Creatinine,U: 54 mg/dL
Microalb Creat Ratio: 1.4 mg/g (ref 0.0–30.0)
Microalb, Ur: 0.8 mg/dL (ref 0.0–1.9)

## 2023-09-09 DIAGNOSIS — K219 Gastro-esophageal reflux disease without esophagitis: Secondary | ICD-10-CM | POA: Diagnosis not present

## 2023-09-09 DIAGNOSIS — H60501 Unspecified acute noninfective otitis externa, right ear: Secondary | ICD-10-CM | POA: Diagnosis not present

## 2023-09-09 DIAGNOSIS — Z6829 Body mass index (BMI) 29.0-29.9, adult: Secondary | ICD-10-CM | POA: Diagnosis not present

## 2023-09-09 DIAGNOSIS — R0982 Postnasal drip: Secondary | ICD-10-CM | POA: Diagnosis not present

## 2023-11-26 DIAGNOSIS — N925 Other specified irregular menstruation: Secondary | ICD-10-CM | POA: Diagnosis not present

## 2023-11-26 DIAGNOSIS — N951 Menopausal and female climacteric states: Secondary | ICD-10-CM | POA: Diagnosis not present

## 2023-12-13 ENCOUNTER — Other Ambulatory Visit: Payer: Self-pay

## 2023-12-20 ENCOUNTER — Ambulatory Visit: Payer: BC Managed Care – PPO | Admitting: Internal Medicine

## 2023-12-20 ENCOUNTER — Encounter: Payer: Self-pay | Admitting: Internal Medicine

## 2023-12-20 VITALS — BP 120/70 | HR 79 | Ht 63.0 in | Wt 162.2 lb

## 2023-12-20 DIAGNOSIS — E1165 Type 2 diabetes mellitus with hyperglycemia: Secondary | ICD-10-CM | POA: Diagnosis not present

## 2023-12-20 DIAGNOSIS — E559 Vitamin D deficiency, unspecified: Secondary | ICD-10-CM

## 2023-12-20 DIAGNOSIS — Z794 Long term (current) use of insulin: Secondary | ICD-10-CM

## 2023-12-20 DIAGNOSIS — E161 Other hypoglycemia: Secondary | ICD-10-CM

## 2023-12-20 MED ORDER — METFORMIN HCL 500 MG PO TABS: 500.0000 mg | ORAL_TABLET | Freq: Two times a day (BID) | ORAL | 3 refills | Status: AC

## 2023-12-20 MED ORDER — DAPAGLIFLOZIN PROPANEDIOL 10 MG PO TABS: 10.0000 mg | ORAL_TABLET | Freq: Every day | ORAL | 3 refills | Status: DC

## 2023-12-20 MED ORDER — OZEMPIC (0.25 OR 0.5 MG/DOSE) 2 MG/1.5ML ~~LOC~~ SOPN: 0.5000 mg | PEN_INJECTOR | SUBCUTANEOUS | 3 refills | Status: DC

## 2023-12-20 MED ORDER — SIMVASTATIN 20 MG PO TABS: 20.0000 mg | ORAL_TABLET | Freq: Every day | ORAL | 3 refills | Status: AC

## 2023-12-20 NOTE — Patient Instructions (Addendum)
 Please continue: - Metformin 500 mg 2x a day.  Please start: - Ozempic 0.25 mg weekly in a.m. (for example on Sunday morning) x 2-4 weeks, then increase to 0.5 mg weekly in a.m. if no nausea or hypoglycemia.   Please return in 3-4 months with your sugar log.

## 2023-12-20 NOTE — Progress Notes (Signed)
 Patient ID: Jennifer Jones, female   DOB: 1976-06-16, 48 y.o.   MRN: [id number]  HPI: Jennifer Jones is a 48 y.o.-year-old female, returning for f/u for prediabetes, dx in 06/2011 - HbA1c was 6.4%, non-insulin-dependent, without long-term complications and also, idiopathic postprandial sd. She also saw Drs. Altheimer and Sharl Ma in the past. Last visit 4 months ago.  Interim history: No increased urination, blurry vision, nausea, chest pain. She does describe decreased appetite especially for the foods cooked by her.  She cooks every day. She was not able to start Ozempic since last visit that she was not able to get it from the pharmacy.  Therefore, she continued [place] but ran out 2 weeks ago and was not able to refill it.  She still has yeast infections.  She  Diabetes: Reviewed HbA1c levels: Lab Results  Component Value Date   HGBA1C 6.4 (A) 08/22/2023   HGBA1C 6.6 (A) 02/19/2023   HGBA1C 6.3 (A) 08/02/2022  04/16/2023: HbA1c 6.3%  02/01/2021: HbA1c 6.5% 11/02/2020: HbA1c 6.2% Initial HbA1c was 6.4% >> lost 10 lbs >> HbA1c decreased.   She is on: - Metformin [phone] mg twice a day >> 500 mg once a day at night >> metformin ER 1000 mg with dinner >> 500 mg 2x a day - Farxiga 10 mg daily in am - did not stop (but ran out 2 weeks ago) - Ozempic 0.25 mg weekly - could not start 08/2023  Previously on Farxiga 5 mg before b'fast - started 11/2021 >> 10 mg daily -stopped 08/2023 due to increased hunger and yeast infections. We again tried Ozempic 0.25 mg in 12/2020 - not covered by insurance.  She tried Metformin ER >> no changes. We tried acarbose 25 mg before breakfast but she developed bloating and could not tolerate it. She tried Trulicity but developed an itching rash and had to stop.  Pt checks her sugars 1-3 times a day: - am: 130-164 >> 110-124 >> 125-130 >> 126-130 >> 125-140 >> 126-130 - 2h after b'fast: 160-210 or 70s  >> 170-200 >> n/c - before lunch: n/c >> n/c >> 108 >>  132 - 2h after lunch: n/c >> 80,120-140 >> n/c  - before dinner: 90-110 >> 90-99 >> n/c  - 2h after dinner: 150-160 >> 170-180, 220, 240 >> 160-170 >> 109-193 - bedtime: n/c >> 116-120 >> n/c - nighttime: n/c No lows. Lowest sugar was 70s >> .Marland Kitchen.108 >> 109; she has hypoglycemia awareness in the 70s. Highest sugar was 240 (Ramadan, off meds) >> 170 >> 190s  Glucometer: Accu-Chek.  Pt's meals are: - Breakfast: whole wheat + olive oil, avocado + eggs - Lunch: sandwich  - Dinner: meat + starch + veggies - Snacks: nuts, fruit No regular exercise but stays active. She does not drink sweet drinks. She saw nutrition in the past.  -No CKD, last BUN/creatinine:  Lab Results  Component Value Date   BUN 18 08/22/2023   CREATININE 0.85 08/22/2023   Lab Results  Component Value Date   MICRALBCREAT 1.4 08/22/2023   MICRALBCREAT 2.2 08/02/2022   MICRALBCREAT 1.2 10/26/2021   MICRALBCREAT 8 04/19/2016   -+ HL; last set of lipids: Lab Results  Component Value Date   CHOL 169 08/22/2023   HDL 40.80 08/22/2023   LDLCALC 88 08/22/2023   TRIG 201.0 (H) 08/22/2023   CHOLHDL 4 08/22/2023  She continues on simvastatin 20 mg daily, with occasional muscle aches.   She has fatty liver per right upper quadrant ultrasound (12/28/2013): The liver  demonstrates coarse echotexture and increased echogenicity, likely reflecting diffuse steatosis. No overt cirrhotic contour abnormalities or focal lesions are identified. There is no evidence of intrahepatic biliary ductal dilatation. The portal vein is open.   IMPRESSION: Findings consistent with hepatic steatosis.   She had an EGD in 2022 >> stomach ulcers. Off Protonix now.  - last eye exam was in 2024: No DR reportedly.  - no numbness and tingling in her feet.  Last foot exam 08/02/2022 here in clinic but she also saw Dr. Romualdo Bolk 11/19/2022 -reviewed evaluation: Palpable Dorsalis pedis and Posterior tibial pulses Immediate CFT to all  toes Protective and light touch sensation are both intact Negative instability noted No palpable soft tissue mass is noted either No left 2nd and 3rd MTP joint instability is noted +5/5 DF/PF muscle strength is noted Zero degrees of passive right ankle joint dorsiflexion  Negative tinels sign with percussion of the deep peroneal nerve Antalgic gait during walking and standing in the office She developed numbness and discomfort in 2023. She saw podiatry >> got steroid injections. She saw neurology >> extensive investigation showed a high vitamin B6. She was told to stop vitamins, animal organs, whole grains, fish, legumes. She was on Gabapentin x 1 mo and Diclofenac >> stopped. On Turmeric. Sxs improved, with only the right 2 toes affected.  Idiopathic postprandial sd.  Reviewed  history: At last visit, she complained of sxs of hypoglycemia without low CBG readings starting 2012.   During this episodes, she felt very fatigued, tremulous, 45 minutes to 2 hours after eating, especially after breakfast.  The episodes were not improving with glucose intake.  Sugars normal during the episodes.    Interestingly, she had a low blood sugar of 45 at one-point but without associated symptoms.  This happened after she increase the Metformin dose to 1000 mg twice a day, but then switched to a lower dose.  No low blood sugars anymore.  History vitamin D deficiency:  Previously on 2000 units daily, but we increased the dose to 4000 units daily in 2016. Then on 5000 units daily >> stopped 06/2022 to decrease her medication burden. Now on ~5000 units qod.  Review levels: Lab Results  Component Value Date   VD25OH 40.93 08/22/2023   VD25OH 39.99 08/02/2022   VD25OH 36.32 10/26/2021   VD25OH 39.5 12/15/2020   VD25OH 42.2 09/17/2019   VD25OH 28.1 (L) 01/29/2017   VD25OH 39 09/23/2014   VD25OH 47 06/04/2014   Other labs reviewed: 02/13/2023: TSH 0.721 05/19/2020: TSH 0.59  02/13/2023: Ferritin  85  ROS: Constitutional:  + See HPI  I reviewed pt's medications, allergies, PMH, social hx, family hx, and changes were documented in the history of present illness. Otherwise, unchanged from my initial visit note.  Past Medical History:  Diagnosis Date   Anxiety    Bulging lumbar disc    Diabetes mellitus, type II (HCC)    Fatty liver    Fibrocystic breast 03/2007   Genital atrophy of female 2005   H/O varicella    H/O: menorrhagia 2011   Hx gestational diabetes 2002 and 2005   Hx of candidal vulvovaginitis    Hx: UTI (urinary tract infection)    Hyperlipemia    Sciatica    Vitamin D deficiency    Past Surgical History:  Procedure Laterality Date   CESAREAN SECTION  98.02, 05    x 3   COSMETIC SURGERY  2019   History   Social History   Marital Status:  Married    Spouse Name: N/A   Number of Children: 3   Years of Education: bachelor    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   Mom visits from [place].    Current Outpatient Medications on File Prior to Visit  Medication Sig Dispense Refill   Blood Glucose Monitoring Suppl (TRUE METRIX METER) W/DEVICE KIT Used as instructed 1 kit 0   cholecalciferol (VITAMIN D) 1000 UNITS tablet Take 1,000 Units by mouth daily.      dapagliflozin propanediol (FARXIGA) 10 MG TABS tablet Take 1 tablet (10 mg total) by mouth daily before breakfast. 90 tablet 3   glucose blood (TRUE METRIX BLOOD GLUCOSE TEST) test strip Use as instructed 100 each 12   levonorgestrel (MIRENA) 20 MCG/24HR IUD 1 each by Intrauterine route once.     metFORMIN (GLUCOPHAGE) 500 MG tablet Take 1 tablet (500 mg total) by mouth 2 (two) times daily with a meal.  180 tablet 3   naproxen (NAPROSYN) 500 MG tablet Take 1 tablet (500 mg total) by mouth 2 (two) times daily with a meal. 14 tablet 0   simvastatin (ZOCOR) 10 MG tablet Take 1 tablet (10 mg total) by mouth at bedtime. 30 tablet 11   TRUEPLUS LANCETS 28G MISC Use as directed 100 each 12   No current facility-administered medications on file prior to visit.   Allergies  Allergen Reactions   Codeine Itching   Cyclobenzaprine     Other reaction(s): hives, with naprosyn   Darvocet [Propoxyphene N-Acetaminophen] Itching   Dilaudid [Hydromorphone Hcl] Itching   Dulaglutide     Other reaction(s): local reaction   Percocet [Oxycodone-Acetaminophen] Itching   Triamcinolone Rash    Cream caused rash   Family History  Problem Relation Age of Onset   Hypertension Mother    Heart disease Mother    Hyperlipidemia Mother    Depression Father    Hyperlipidemia Sister    Depression Sister    Hyperlipidemia Brother    Anxiety disorder Son    Hyperlipidemia Sister    Depression Sister    Hypertension Maternal Grandfather    Cancer Maternal Grandmother    PE: BP 120/70   Pulse 79   Ht 5\' 3"  (1.6 m)   Wt 162 lb 3.2 oz (73.6 kg)   SpO2 96%   BMI 28.73 kg/m   Wt Readings from Last 3 Encounters:  12/20/23 162 lb 3.2 oz (73.6 kg)  08/22/23 167 lb (75.8 kg)  02/19/23 162 lb 6.4 oz (73.7 kg)   Constitutional: normal overweight, in NAD Eyes:  EOMI, no exophthalmos ENT: no neck masses, no cervical lymphadenopathy Cardiovascular: RRR, No MRG Respiratory: CTA B Musculoskeletal: no deformities Skin:no rashes Neurological: no tremor with outstretched hands  ASSESSMENT: 1.  DM2, non-insulin-dependent  2. Idiopathic postprandial sd.  3. Vitamin D deficiency  4.  Overweight  PLAN:  1. Patient with history of prediabetes, with a new diagnosis of diabetes in 01/2021.  She is on low-dose metformin and she was also on an SGLT2 inhibitor before last visit, however, she described increased  hunger  and yeast infections (treated by OB/GYN) and she wanted to start [place].  We stopped this at last visit.  At that time, per her preference, we tried to start Ozempic.  I recommended a low-dose and increase as tolerated. -At today's visit, sugars are mostly at goal in the morning but she has hyperglycemic spikes after dinner, up to 190s.  However, HbA1c at today's visit is slightly lower than before, at goal.  Upon questioning, she was not able to start Ozempic and had to continue [place].  She is now off [place] for 2 weeks.  She would prefer not to continue with the due to yeast infections.  Will again try to start Ozempic.  I gave her written prescription for this but also for Farxiga in case she is not able to obtain the Ozempic.  She had rash with Trulicity so we cannot use this for her, but Greggory Keen would be another option, in case Ozempic is not covered -I advised her to: Patient Instructions  Please continue: - Metformin 500 mg 2x a day.  Please start: - Ozempic 0.25 mg weekly in a.m. (for example on Sunday morning) x 2-4 weeks, then increase to 0.5 mg weekly in a.m. if no nausea or hypoglycemia.   Please return in 3-4 months with your sugar log.   - we checked her HbA1c: 6.2% (lower) - advised to check sugars at different times of the day - 1x a day, rotating check times - advised for yearly eye exams >> she is UTD - return to clinic in 3-4 months  2.  Idiopathic postprandial sd. -She has a history of postprandial fatigue and tremors, but not associated with hypoglycemia -We tried acarbose but she did not tolerate it due to bloating and gas -She saw nutrition in the past -Previously on a keto diet but she did not feel good on this, in fact, she felt worse -She described lower blood sugars in the middle of the day after taking metformin in the morning.  We moved metformin to dinnertime and she had no more low blood sugars during the day.   -At today's visit, she does not  describe any low blood sugars   3. Vitamin D deficiency  -She was previously on 5000 units vitamin D daily, then came off to reduce her medication burden, however, she went back to the 5000 units every other day daily before last visit -Vitamin D level was normal at last check: Lab Results  Component Value Date   VD25OH 40.93 08/22/2023  -We will continue to keep an eye on this  Carlus Pavlov, MD PhD [facility] Endocrinology

## 2023-12-24 ENCOUNTER — Encounter: Payer: Self-pay | Admitting: Internal Medicine

## 2023-12-24 ENCOUNTER — Telehealth: Payer: Self-pay

## 2023-12-24 NOTE — Telephone Encounter (Signed)
 Pt needs a PA for Ozempic

## 2023-12-24 NOTE — Telephone Encounter (Signed)
 Pharmacy Patient Advocate Encounter   Received notification from Pt Calls Messages that prior authorization for Ozempic is required/requested.   Insurance verification completed.   The patient is insured through [facility] .   Per test claim: PA required; PA submitted to above mentioned insurance via CoverMyMeds Key/confirmation #/EOC [id number] Status is pending

## 2024-01-10 NOTE — Telephone Encounter (Signed)
 Pharmacy Patient Advocate Encounter  Received notification from [facility] that Prior Authorization for Ozempic has been APPROVED through 01/09/27

## 2024-04-28 ENCOUNTER — Ambulatory Visit: Admitting: Internal Medicine

## 2024-05-02 ENCOUNTER — Encounter: Payer: Self-pay | Admitting: Internal Medicine

## 2024-05-02 ENCOUNTER — Ambulatory Visit: Payer: Self-pay | Admitting: Internal Medicine

## 2024-05-02 VITALS — BP 114/72 | HR 79 | Temp 98.2°F | Ht 63.39 in | Wt 164.4 lb

## 2024-05-02 DIAGNOSIS — R7303 Prediabetes: Secondary | ICD-10-CM

## 2024-05-02 MED ORDER — GLIMEPIRIDE 2 MG PO TABS
ORAL_TABLET | ORAL | 3 refills | Status: DC
Start: 2024-05-02 — End: 2024-06-17

## 2024-05-02 NOTE — Progress Notes (Signed)
 Internal MEDICINE  Office Visit Note  Patient Name: Jennifer Jones  [id number]  [id number]  Date of Service: 07/13/2024  Chief Complaint  Patient presents with   Diabetes    Patient feels tired and has no energy. Her blood sugar is normal when she feels fatigued and checks it.    Spot on leg    There is a new spot on her left leg that has not gone away. She first noticed it over 1 year ago. Initially it was red, but now it is brown.     HPI  Seen for dm and spot on leg, no trauma    Current Medication: Outpatient Encounter Medications as of 05/02/2024  Medication Sig   metFORMIN  (GLUCOPHAGE ) 500 MG tablet Take 1 tablet (500 mg total) by mouth 2 (two) times daily with a meal.   simvastatin  (ZOCOR ) 20 MG tablet Take 1 tablet (20 mg total) by mouth at bedtime.   [DISCONTINUED] glimepiride  (AMARYL ) 2 MG tablet Take 1/2  tab po every day with biggest meal of the day   cholecalciferol (VITAMIN D ) 1000 UNITS tablet Take 1,000 Units by mouth daily.    levonorgestrel  (MIRENA ) 20 MCG/24HR IUD 1 each by Intrauterine route once.   naproxen  (NAPROSYN ) 500 MG tablet Take 1 tablet (500 mg total) by mouth 2 (two) times daily with a meal.   [DISCONTINUED] Blood Glucose Monitoring Suppl (TRUE METRIX METER) W/DEVICE KIT Used as instructed   [DISCONTINUED] dapagliflozin  propanediol (FARXIGA ) 10 MG TABS tablet Take 1 tablet (10 mg total) by mouth daily before breakfast. (Patient not taking: Reported on 05/30/2024)   [DISCONTINUED] glucose blood (TRUE METRIX BLOOD GLUCOSE TEST) test strip Use as instructed   [DISCONTINUED] Semaglutide ,0.25 or 0.5MG /DOS, (OZEMPIC , 0.25 OR 0.5 MG/DOSE,) 2 MG/1.5ML SOPN Inject 0.5 mg into the skin once a week. (Patient not taking: Reported on 05/30/2024)   [DISCONTINUED] TRUEPLUS LANCETS 28G MISC Use as directed   No facility-administered encounter medications on file as of 05/02/2024.    Surgical History: Past Surgical History:  Procedure Laterality Date   CESAREAN  SECTION  98.02, 05    x 3   COSMETIC SURGERY  2019    Medical History: Past Medical History:  Diagnosis Date   Anxiety    Bulging lumbar disc    Diabetes mellitus, type II (HCC)    Fatty liver    Fibrocystic breast 03/2007   Genital atrophy of female 2005   H/O varicella    H/O: menorrhagia 2011   Hx gestational diabetes 2002 and 2005   Hx of candidal vulvovaginitis    Hx: UTI (urinary tract infection)    Hyperlipemia    Sciatica    Vitamin D  deficiency     Family History: Family History  Problem Relation Age of Onset   Hypertension Mother    Heart disease Mother    Hyperlipidemia Mother    Depression Father    Hyperlipidemia Sister    Depression Sister    Hyperlipidemia Brother    Anxiety disorder Son    Hyperlipidemia Sister    Depression Sister    Hypertension Maternal Grandfather    Cancer Maternal Grandmother     Social History   Socioeconomic History   Marital status: Legally Separated    Spouse name: Not on file   Number of children: 3   Years of education: bachelor + 2   Highest education level: Not on file  Occupational History   Occupation: Pre K Runner, broadcasting/film/video     Comment: Private  Daycare   Tobacco Use   Smoking status: Never   Smokeless tobacco: Never  Vaping Use   Vaping status: Never Used  Substance and Sexual Activity   Alcohol use: Never   Drug use: Never   Sexual activity: Yes    Birth control/protection: I.U.D.    Comment: MIRENA  placed in 2012   Other Topics Concern   Not on file  Social History Narrative   Lives with 3 sons.    Mom visits from [place].    Caffeine- coffee, 1 daily   Social Drivers of Health   Financial Resource Strain: Not on file  Food Insecurity: Food Insecurity Present (04/21/2020)   Hunger Vital Sign    Worried About Running Out of Food in the Last Year: Sometimes true    Ran Out of Food in the Last Year: Sometimes true  Transportation Needs: No Transportation Needs (04/21/2020)   PRAPARE - Therapist, art (Medical): No    Lack of Transportation (Non-Medical): No  Physical Activity: Not on file  Stress: Not on file  Social Connections: Not on file  Intimate Partner Violence: Not on file      Review of Systems  Constitutional:  Positive for fatigue. Negative for fever.  HENT:  Negative for congestion, mouth sores and postnasal drip.   Respiratory:  Negative for cough.   Cardiovascular:  Negative for chest pain.  Genitourinary:  Negative for flank pain.  Skin:  Positive for rash.  Psychiatric/Behavioral: Negative.      Vital Signs: BP 114/72 (BP Location: Left Arm, Patient Position: Sitting, Cuff Size: Normal)   Pulse 79   Temp 98.2 F (36.8 C) (Oral)   Ht 5' 3.39 (1.61 m)   Wt 164 lb 6.4 oz (74.6 kg)   SpO2 98%   BMI 28.77 kg/m    Physical Exam Constitutional:      Appearance: Normal appearance.  HENT:     Head: Normocephalic and atraumatic.     Nose: Nose normal.     Mouth/Throat:     Mouth: Mucous membranes are moist.     Pharynx: No posterior oropharyngeal erythema.  Eyes:     Extraocular Movements: Extraocular movements intact.     Pupils: Pupils are equal, round, and reactive to light.  Cardiovascular:     Pulses: Normal pulses.     Heart sounds: Normal heart sounds.  Pulmonary:     Effort: Pulmonary effort is normal.     Breath sounds: Normal breath sounds.  Neurological:     General: No focal deficit present.     Mental Status: She is alert.  Psychiatric:        Mood and Affect: Mood normal.        Behavior: Behavior normal.        Assessment/Plan: 1. Prediabetes (Primary) Pt has risk of hypoglycemia in the past, avoid any medications    General Counseling: Jennifer Jones verbalizes understanding of the findings of todays visit and agrees with plan of treatment. I have discussed any further diagnostic evaluation that may be needed or ordered today. We also reviewed her medications today. she has been encouraged to call the office  with any questions or concerns that should arise related to todays visit.    No orders of the defined types were placed in this encounter.   Meds ordered this encounter  Medications   DISCONTD: glimepiride  (AMARYL ) 2 MG tablet    Sig: Take 1/2  tab po every day with biggest  meal of the day    Dispense:  30 tablet    Refill:  3    Total time spent:35 Minutes Time spent includes review of chart, medications, test results, and follow up plan with the patient.   [place] Controlled Substance Database was reviewed by me.   Dr Tameem Pullara M Yajaira Doffing Internal medicine

## 2024-05-30 ENCOUNTER — Encounter: Payer: Self-pay | Admitting: Internal Medicine

## 2024-05-30 ENCOUNTER — Telehealth: Payer: Self-pay | Admitting: Internal Medicine

## 2024-05-30 DIAGNOSIS — F5105 Insomnia due to other mental disorder: Secondary | ICD-10-CM

## 2024-05-30 DIAGNOSIS — E1165 Type 2 diabetes mellitus with hyperglycemia: Secondary | ICD-10-CM

## 2024-05-30 DIAGNOSIS — F411 Generalized anxiety disorder: Secondary | ICD-10-CM

## 2024-05-30 MED ORDER — ESCITALOPRAM OXALATE 10 MG PO TABS: 10.0000 mg | ORAL_TABLET | Freq: Every day | ORAL | 3 refills | Status: DC

## 2024-05-30 MED ORDER — HYDROXYZINE HCL 10 MG PO TABS: ORAL_TABLET | ORAL | 3 refills | Status: DC

## 2024-05-30 NOTE — Progress Notes (Signed)
 Internal MEDICINE  Telephone Visit  Patient Name: Jennifer Jones  [id number]  [id number]  Date of Service: 05/30/2024  I connected with the patient at 906 am  by telephone and verified the patients identity using two identifiers.   I discussed the limitations, risks, security and privacy concerns of performing an evaluation and management service by telephone and the availability of in person appointments. I also discussed with the patient that there may be a patient responsible charge related to the service.  The patient expressed understanding and agrees to proceed.    Chief Complaint  Patient presents with   Follow-up   Anxiety    Her anxiety has worsened recently. She is not currently taking any medications. She is interested in starting medications for anxiety.     HPI Pt is having anxiety symptoms, chest gets tight, emotional, has sleeping difficulty Pt has been mirena, does not know if menopausal  Dm is well controlled    Current Medication: Outpatient Encounter Medications as of 05/30/2024  Medication Sig   escitalopram (LEXAPRO) 10 MG tablet Take 1 tablet (10 mg total) by mouth daily.   glimepiride (AMARYL) 2 MG tablet Take 1/2  tab po every day with biggest meal of the day   hydrOXYzine (ATARAX) 10 MG tablet Take one tab po at bedtime for sleep   levonorgestrel (MIRENA) 20 MCG/24HR IUD 1 each by Intrauterine route once.   simvastatin (ZOCOR) 20 MG tablet Take 1 tablet (20 mg total) by mouth at bedtime.   Blood Glucose Monitoring Suppl (TRUE METRIX METER) W/DEVICE KIT Used as instructed   cholecalciferol (VITAMIN D) 1000 UNITS tablet Take 1,000 Units by mouth daily.    glucose blood (TRUE METRIX BLOOD GLUCOSE TEST) test strip Use as instructed   metFORMIN (GLUCOPHAGE) 500 MG tablet Take 1 tablet (500 mg total) by mouth 2 (two) times daily with a meal. (Patient not taking: Reported on 05/30/2024)   naproxen (NAPROSYN) 500 MG tablet Take 1 tablet (500 mg total) by mouth 2  (two) times daily with a meal.   TRUEPLUS LANCETS 28G MISC Use as directed   [DISCONTINUED] dapagliflozin propanediol (FARXIGA) 10 MG TABS tablet Take 1 tablet (10 mg total) by mouth daily before breakfast. (Patient not taking: Reported on 05/30/2024)   [DISCONTINUED] Semaglutide,0.25 or 0.5MG /DOS, (OZEMPIC, 0.25 OR 0.5 MG/DOSE,) 2 MG/1.5ML SOPN Inject 0.5 mg into the skin once a week. (Patient not taking: Reported on 05/30/2024)   No facility-administered encounter medications on file as of 05/30/2024.    Surgical History: Past Surgical History:  Procedure Laterality Date   CESAREAN SECTION  98.02, 05    x 3   COSMETIC SURGERY  2019    Medical History: Past Medical History:  Diagnosis Date   Anxiety    Bulging lumbar disc    Diabetes mellitus, type II (HCC)    Fatty liver    Fibrocystic breast 03/2007   Genital atrophy of female 2005   H/O varicella    H/O: menorrhagia 2011   Hx gestational diabetes 2002 and 2005   Hx of candidal vulvovaginitis    Hx: UTI (urinary tract infection)    Hyperlipemia    Sciatica    Vitamin D deficiency     Family History: Family History  Problem Relation Age of Onset   Hypertension Mother    Heart disease Mother    Hyperlipidemia Mother    Depression Father    Hyperlipidemia Sister    Depression Sister    Hyperlipidemia Brother  Anxiety disorder Son    Hyperlipidemia Sister    Depression Sister    Hypertension Maternal Grandfather    Cancer Maternal Grandmother     Social History   Socioeconomic History   Marital status: Legally Separated    Spouse name: Not on file   Number of children: 3   Years of education: bachelor + 2   Highest education level: Not on file  Occupational History   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    Comment: MIRENA placed in 2012   Other Topics Concern   Not on file  Social History Narrative   Lives with 3 sons.    Mom visits from [place].    Caffeine- coffee, 1 daily   Social Drivers of Health   Financial Resource Strain: Not on file  Food Insecurity: Food Insecurity Present (04/21/2020)   Hunger Vital Sign    Worried About Running Out of Food in the Last Year: Sometimes true    Ran Out of Food in the Last Year: Sometimes true  Transportation Needs: No Transportation Needs (04/21/2020)   PRAPARE - Administrator, Civil Service (Medical): No    Lack of Transportation (Non-Medical): No  Physical Activity: Not on file  Stress: Not on file  Social Connections: Not on file  Intimate Partner Violence: Not on file      Review of Systems  Vital Signs: There were no vitals taken for this visit.   Observation/Objective:  No exam is performed    Assessment/Plan: 1. GAD (generalized anxiety disorder) (Primary) Start lexapro  - escitalopram (LEXAPRO) 10 MG tablet; Take 1 tablet (10 mg total) by mouth daily.  Dispense: 30 tablet; Refill: 3  2. Insomnia due to other mental disorder Can try prn hydroxyzine for sleep - hydrOXYzine (ATARAX) 10 MG tablet; Take one tab po at bedtime for sleep  Dispense: 30 tablet; Refill: 3  3. Type 2 diabetes mellitus with hyperglycemia, without long-term current use of insulin (HCC) Continue amaryl as before    General Counseling: [name] verbalizes understanding of the findings of today's phone visit and agrees with plan of treatment. I have discussed any further diagnostic evaluation that may be needed or ordered today. We also reviewed her medications today. she has been encouraged to call the office with any questions or concerns that should arise related to todays visit.    No orders of the defined types were placed in this encounter.   Meds ordered this encounter   Medications   escitalopram (LEXAPRO) 10 MG tablet    Sig: Take 1 tablet (10 mg total) by mouth daily.    Dispense:  30 tablet    Refill:  3   hydrOXYzine (ATARAX) 10 MG tablet    Sig: Take one tab po at bedtime for sleep    Dispense:  30 tablet    Refill:  3    Time spent:10 Minutes    Dr Soloman Mckeithan M Kadiat[facility] Internal medicine

## 2024-06-17 ENCOUNTER — Encounter: Payer: Self-pay | Admitting: Internal Medicine

## 2024-06-17 ENCOUNTER — Ambulatory Visit: Admitting: Internal Medicine

## 2024-06-17 VITALS — BP 128/80 | HR 78 | Ht 63.39 in | Wt 164.6 lb

## 2024-06-17 DIAGNOSIS — Z794 Long term (current) use of insulin: Secondary | ICD-10-CM | POA: Diagnosis not present

## 2024-06-17 DIAGNOSIS — E1165 Type 2 diabetes mellitus with hyperglycemia: Secondary | ICD-10-CM | POA: Diagnosis not present

## 2024-06-17 DIAGNOSIS — E559 Vitamin D deficiency, unspecified: Secondary | ICD-10-CM | POA: Diagnosis not present

## 2024-06-17 DIAGNOSIS — E161 Other hypoglycemia: Secondary | ICD-10-CM

## 2024-06-17 LAB — POCT GLYCOSYLATED HEMOGLOBIN (HGB A1C): Hemoglobin A1C: 6.6 % — AB (ref 4.0–5.6)

## 2024-06-17 MED ORDER — GLUCOSE BLOOD VI STRP
ORAL_STRIP | 3 refills | Status: AC
Start: 2024-06-17 — End: ?

## 2024-06-17 MED ORDER — TIRZEPATIDE 2.5 MG/0.5ML ~~LOC~~ SOAJ: 2.5000 mg | SUBCUTANEOUS | 3 refills | Status: DC

## 2024-06-17 MED ORDER — ACCU-CHEK SOFTCLIX LANCETS MISC: 3 refills | Status: AC

## 2024-06-17 NOTE — Patient Instructions (Addendum)
 Please continue: - Metformin  500 mg 2x a day.  Please try to start: - Mounjaro  2.5 mg weekly - let me know if we can increase the dose  Please return in 4 months with your sugar log.

## 2024-06-17 NOTE — Progress Notes (Signed)
 Patient ID: Jennifer Jones, female   DOB: 1975-11-23, 48 y.o.   MRN: [id number]  HPI: Jennifer Jones is a 48 y.o.-year-old female, returning for f/u for prediabetes, dx in 06/2011 - HbA1c was 6.4%, non-insulin-dependent, without long-term complications and also, idiopathic postprandial sd. She also saw Drs. Altheimer and Faythe in the past. Last visit 6 months ago.  Interim history: No increased urination, blurry vision, nausea, chest pain.   She had increased stress over the summer with her father being sick.  She had to travel to [place] and she ended up being let go from work.  She does have anxiety. She started Ozempic  since last OV >> had to come off 2/2 losing insurance 3 mo ago. She was approved for M'aid last week.  Diabetes: Reviewed HbA1c levels: 12/20/2023: HbA1c 6.2% Lab Results  Component Value Date   HGBA1C 6.4 (A) 08/22/2023   HGBA1C 6.6 (A) 02/19/2023   HGBA1C 6.3 (A) 08/02/2022  04/16/2023: HbA1c 6.3%  02/01/2021: HbA1c 6.5% 11/02/2020: HbA1c 6.2% Initial HbA1c was 6.4% >> lost 10 lbs >> HbA1c decreased.   She is on: - Metformin  [phone] mg twice a day >> 500 mg once a day at night >> metformin  ER 1000 mg with dinner >> 500 mg 2x a day - Farxiga  10 mg daily in am >> stopped - Ozempic  0.25 mg weekly >> stopped  Previously on Farxiga  >> off 2/2 increased hunger and recurrent yeast infections. She tried Metformin  ER >> no changes. We tried acarbose  25 mg before breakfast but she developed bloating and could not tolerate it. She tried Trulicity  but developed an itching rash and had to stop.  Pt checks her sugars 1-3 times a day: - am: 110-124 >> 125-130 >> 126-130 >> 125-140 >> 126-130 >> 124-138 - 2h after b'fast: 160-210 or 70s  >> 170-200 >> n/c >> 170 - before lunch: n/c >> n/c >> 108 >> 132  >> 83, 117 - 2h after lunch: n/c >> 80,120-140 >> n/c  - before dinner: 90-110 >> 90-99 >> n/c  - 2h after dinner:170-180, 220, 240 >> 160-170 >> 109-193 >> 143 - bedtime:  n/c >> 116-120 >> n/c - nighttime: n/c No lows. Lowest sugar was 70s >> .SABRA.108 >> 109 >> 83; she has hypoglycemia awareness in the 70s. Highest sugar was 240 (Ramadan, off meds) >> 170 >> 190s >> 170  Glucometer: Accu-Chek.  Pt's meals are: - Breakfast: whole wheat + olive oil, avocado + eggs - Lunch: sandwich  - Dinner: meat + starch + veggies - Snacks: nuts, fruit No regular exercise but stays active. She does not drink sweet drinks. She saw nutrition in the past.  -No CKD, last BUN/creatinine:  Lab Results  Component Value Date   BUN 18 08/22/2023   CREATININE 0.85 08/22/2023   Lab Results  Component Value Date   MICRALBCREAT 8 04/19/2016   -+ HL; last set of lipids: Lab Results  Component Value Date   CHOL 169 08/22/2023   HDL 40.80 08/22/2023   LDLCALC 88 08/22/2023   TRIG 201.0 (H) 08/22/2023   CHOLHDL 4 08/22/2023  She continues on simvastatin  20 mg daily, with occasional muscle aches.   She has fatty liver per right upper quadrant ultrasound (12/28/2013): The liver demonstrates coarse echotexture and increased echogenicity, likely reflecting diffuse steatosis. No overt cirrhotic contour abnormalities or focal lesions are identified. There is no evidence of intrahepatic biliary ductal dilatation. The portal vein is open.   IMPRESSION: Findings consistent with hepatic steatosis.  She had an EGD in 2022 >> stomach ulcers. Off Protonix now.  - last eye exam was in 2024: No DR reportedly.  - no numbness and tingling in her feet.  Last foot exam 12/2023 She developed numbness and discomfort in 2023. She saw podiatry >> got steroid injections. She saw neurology >> extensive investigation showed a high vitamin B6. She was told to stop vitamins, animal organs, whole grains, fish, legumes. She was on Gabapentin x 1 mo and Diclofenac  >> stopped. On Turmeric. Sxs improved, with only the right 2 toes affected.  Idiopathic postprandial sd.  Reviewed  history: At last  visit, she complained of sxs of hypoglycemia without low CBG readings starting 2012.   During this episodes, she felt very fatigued, tremulous, 50 minutes to 2 hours after eating, especially after breakfast.  The episodes were not improving with glucose intake.  Sugars normal during the episodes.    Interestingly, she had a low blood sugar of 45 at one-point but without associated symptoms.  This happened after she increase the Metformin  dose to 50 mg twice a day, but then switched to a lower dose.  No low blood sugars anymore.  History vitamin D  deficiency:  Previously on 2000 units daily, but we increased the dose to 4000 units daily in 2016. Then on 5000 units daily >> stopped 06/2022 to decrease her medication burden. Now on ~5000 units qod.  Review levels: Lab Results  Component Value Date   VD25OH 40.93 08/22/2023   VD25OH 39.99 08/02/2022   VD25OH 36.32 10/26/2021   VD25OH 39.5 12/15/2020   VD25OH 42.2 09/17/2019   VD25OH 28.1 (L) 01/29/2017   VD25OH 39 09/23/2014   VD25OH 47 06/04/2014   Other labs reviewed: 02/13/2023: TSH 0.721 05/19/2020: TSH 0.59  02/13/2023: Ferritin 85  ROS: Constitutional:  + See HPI  I reviewed pt's medications, allergies, PMH, social hx, family hx, and changes were documented in the history of present illness. Otherwise, unchanged from my initial visit note.  Past Medical History:  Diagnosis Date   Anxiety    Bulging lumbar disc    Diabetes mellitus, type II (HCC)    Fatty liver    Fibrocystic breast 03/2007   Genital atrophy of female 2005   H/O varicella    H/O: menorrhagia 2011   Hx gestational diabetes 2002 and 2005   Hx of candidal vulvovaginitis    Hx: UTI (urinary tract infection)    Hyperlipemia    Sciatica    Vitamin D  deficiency    Past Surgical History:  Procedure Laterality Date   CESAREAN SECTION  98.02, 05    x 3   COSMETIC SURGERY  2019   History   Social History   Marital Status: Married    Spouse Name:  N/A   Number of Children: 3   Years of Education: bachelor    Occupational History   Pre K Company secretary    Social History Main Topics   Smoking status: Never Smoker    Smokeless tobacco: Never Used   Alcohol Use: No   Drug Use: No   Sexual Activity: Yes    Pharmacist, hospital Protection: IUD     Comment: MIRENA  placed in 2012    Social History Narrative   Lives with 3 sons, husband.   Mom visits from [place].    Current Outpatient Medications on File Prior to Visit  Medication Sig Dispense Refill   Blood Glucose Monitoring Suppl (TRUE METRIX METER)  W/DEVICE KIT Used as instructed 1 kit 0   cholecalciferol (VITAMIN D ) 1000 UNITS tablet Take 1,000 Units by mouth daily.      escitalopram  (LEXAPRO ) 10 MG tablet Take 1 tablet (10 mg total) by mouth daily. 30 tablet 3   glimepiride  (AMARYL ) 2 MG tablet Take 1/2  tab po every day with biggest meal of the day 30 tablet 3   glucose blood (TRUE METRIX BLOOD GLUCOSE TEST) test strip Use as instructed 100 each 12   hydrOXYzine  (ATARAX ) 10 MG tablet Take one tab po at bedtime for sleep 30 tablet 3   levonorgestrel  (MIRENA ) 20 MCG/24HR IUD 1 each by Intrauterine route once.     metFORMIN  (GLUCOPHAGE ) 500 MG tablet Take 1 tablet (500 mg total) by mouth 2 (two) times daily with a meal. (Patient not taking: Reported on 05/30/2024) 180 tablet 3   naproxen  (NAPROSYN ) 500 MG tablet Take 1 tablet (500 mg total) by mouth 2 (two) times daily with a meal. 14 tablet 0   simvastatin  (ZOCOR ) 20 MG tablet Take 1 tablet (20 mg total) by mouth at bedtime. 90 tablet 3   TRUEPLUS LANCETS 28G MISC Use as directed 100 each 12   No current facility-administered medications on file prior to visit.   Allergies  Allergen Reactions   Codeine Itching   Cyclobenzaprine      Other reaction(s): hives, with naprosyn    Darvocet [Propoxyphene N-Acetaminophen] Itching   Dilaudid [Hydromorphone Hcl] Itching   Dulaglutide      Other reaction(s): local reaction    Percocet [Oxycodone-Acetaminophen] Itching   Triamcinolone  Rash    Cream caused rash   Family History  Problem Relation Age of Onset   Hypertension Mother    Heart disease Mother    Hyperlipidemia Mother    Depression Father    Hyperlipidemia Sister    Depression Sister    Hyperlipidemia Brother    Anxiety disorder Son    Hyperlipidemia Sister    Depression Sister    Hypertension Maternal Grandfather    Cancer Maternal Grandmother    PE: BP 128/80   Pulse 78   Ht 5' 3.39 (1.61 m)   Wt 164 lb 9.6 oz (74.7 kg)   SpO2 97%   BMI 28.80 kg/m   Wt Readings from Last 3 Encounters:  06/17/24 164 lb 9.6 oz (74.7 kg)  05/02/24 164 lb 6.4 oz (74.6 kg)  12/20/23 162 lb 3.2 oz (73.6 kg)   Constitutional: normal overweight, in NAD Eyes:  EOMI, no exophthalmos ENT: no neck masses, no cervical lymphadenopathy Cardiovascular: RRR, No MRG Respiratory: CTA B Musculoskeletal: no deformities Skin:no rashes Neurological: no tremor with outstretched hands  ASSESSMENT: 1.  DM2, non-insulin-dependent  2. Idiopathic postprandial sd.  3. Vitamin D  deficiency  4.  Overweight  PLAN:  1. Patient with history of prediabetes, but with a new diagnosis of diabetes in 01/2021.  She continues on low-dose metformin  and previously on SGLT2 inhibitor but without good tolerance due to increased hunger and yeast infections (treated by OB/GYN).  At last visit, she wanted to try Ozempic  and I sent a prescription for a low-dose to her pharmacy and advised her how to increase it.  Sugars were mostly at goal in the morning but she had hyperglycemic spikes after dinner, up to 190s.  She was still on Farxiga  but really wanted to come off due to recurrent yeast infections.  I advised her to stop Farxiga  when starting Ozempic . - At last visit, HbA1c was lower, at 6.2%. -  at today's visit, sugars are at or slightly above target in the morning but they are fairly well-controlled later in the day.  She is not  checking consistently and at today's visit I refilled her test trips and lancets. - she tells me that she was able to start Ozempic  since last visit and her sugars improved on this but she was not able to continue due to losing her insurance.  She also felt that she gained some weight on Ozempic .  We discussed possibly trying Mounjaro  and she agrees to try this.  Hoping that this is covered by her insurance.  Will start at a lower dose and see if we can increase the dose.  We can continue metformin  at the current dose for now. - I advised her to: Patient Instructions  Please continue: - Metformin  500 mg 2x a day.  Please try to start: - Mounjaro  2.5 mg weekly - let me know if we can increase the dose  Please return in 4 months with your sugar log.    - we checked her HbA1c: 6.6% (higher) - advised to check sugars at different times of the day - 1x a day, rotating check times - advised for yearly eye exams >> she is UTD - will check annual labs today - return to clinic in 4 months  2.  Idiopathic postprandial sd. -She has a history of postprandial fatigue and tremors, but not associated with hypoglycemia -We tried acarbose  but she did not tolerate it due to bloating and gas - She saw nutrition in the past -Previously on a keto diet but she did not feel good on this, in fact, she felt worse -She described lower blood sugars in the middle of the day after taking metformin  in the morning.  We moved metformin  to dinnertime and she had no more low blood sugars during the day.   - No low CBGs recently  3. Vitamin D  deficiency  -Currently on 5000 units vitamin D  supplement every other day - Vitamin D  level was normal at last check: Lab Results  Component Value Date   VD25OH 40.93 08/22/2023  -We will continue to keep an eye on this -Will recheck today  Lela Fendt, MD PhD [facility] Endocrinology

## 2024-06-18 ENCOUNTER — Ambulatory Visit: Payer: Self-pay | Admitting: Internal Medicine

## 2024-06-18 LAB — LIPID PANEL W/REFLEX DIRECT LDL
Cholesterol: 169 mg/dL (ref <200)
HDL: 35 mg/dL — ABNORMAL LOW (ref >=50)
Non-HDL Cholesterol (Calc): 134 mg/dL — ABNORMAL HIGH (ref <130)
Total CHOL/HDL Ratio: 4.8 (calc) (ref <5.0)
Triglycerides: 546 mg/dL — ABNORMAL HIGH (ref <150)

## 2024-06-18 LAB — MICROALBUMIN / CREATININE URINE RATIO
Creatinine, Urine: 46 mg/dL (ref 20–275)
Microalb Creat Ratio: 26 mg/g{creat} (ref <30)
Microalb, Ur: 1.2 mg/dL

## 2024-06-18 LAB — COMPREHENSIVE METABOLIC PANEL WITH GFR
AG Ratio: 2.1 (calc) (ref 1.0–2.5)
ALT: 30 U/L — ABNORMAL HIGH (ref 6–29)
AST: 17 U/L (ref 10–35)
Albumin: 4.5 g/dL (ref 3.6–5.1)
Alkaline phosphatase (APISO): 52 U/L (ref 37–153)
BUN: 13 mg/dL (ref 7–25)
CO2: 25 mmol/L (ref 20–32)
Calcium: 9.5 mg/dL (ref 8.6–10.4)
Chloride: 103 mmol/L (ref 98–110)
Creat: 0.53 mg/dL (ref 0.50–1.03)
Globulin: 2.1 g/dL (ref 1.9–3.7)
Glucose, Bld: 127 mg/dL (ref 65–139)
Potassium: 4.6 mmol/L (ref 3.5–5.3)
Sodium: 138 mmol/L (ref 135–146)
Total Bilirubin: 0.4 mg/dL (ref 0.2–1.2)
Total Protein: 6.6 g/dL (ref 6.1–8.1)
eGFR: 113 mL/min/1.73m2 (ref >=60)

## 2024-06-18 LAB — DIRECT LDL: Direct LDL: 106 mg/dL — ABNORMAL HIGH (ref <100)

## 2024-06-18 LAB — VITAMIN D 25 HYDROXY (VIT D DEFICIENCY, FRACTURES): Vit D, 25-Hydroxy: 32 ng/mL (ref 30–100)

## 2024-06-20 ENCOUNTER — Ambulatory Visit: Payer: Self-pay | Admitting: Internal Medicine

## 2024-07-01 ENCOUNTER — Telehealth: Payer: Self-pay

## 2024-07-01 ENCOUNTER — Other Ambulatory Visit (HOSPITAL_COMMUNITY): Payer: Self-pay

## 2024-07-01 NOTE — Telephone Encounter (Signed)
 Pharmacy Patient Advocate Encounter   Received notification from CoverMyMeds that prior authorization for Mounjaro  2.5MG /0.5ML auto-injectors is required/requested.   Insurance verification completed.   The patient is insured through [employer] .   Per test claim: PA required; PA submitted to above mentioned insurance via Latent Key/confirmation #/EOC [id number] Status is pending

## 2024-07-02 NOTE — Telephone Encounter (Signed)
 Your request has been approved PA Case: [id number], Status: Approved, Coverage Starts on: 07/01/2024 12:00:00 AM, Coverage Ends on: 07/01/2025 12:00:00 AM. Authorization Expiration09/17/2026

## 2024-07-04 ENCOUNTER — Ambulatory Visit: Payer: Self-pay | Admitting: Internal Medicine

## 2024-07-18 ENCOUNTER — Ambulatory Visit: Payer: Self-pay | Admitting: Internal Medicine

## 2024-07-22 ENCOUNTER — Other Ambulatory Visit: Payer: Self-pay | Admitting: Family Medicine

## 2024-07-22 ENCOUNTER — Encounter: Payer: Self-pay | Admitting: Family Medicine

## 2024-07-22 DIAGNOSIS — M5416 Radiculopathy, lumbar region: Secondary | ICD-10-CM | POA: Diagnosis not present

## 2024-07-22 DIAGNOSIS — E781 Pure hyperglyceridemia: Secondary | ICD-10-CM | POA: Diagnosis not present

## 2024-07-22 DIAGNOSIS — R519 Headache, unspecified: Secondary | ICD-10-CM | POA: Diagnosis not present

## 2024-08-01 ENCOUNTER — Ambulatory Visit: Payer: Self-pay | Admitting: Internal Medicine

## 2024-08-01 DIAGNOSIS — F5105 Insomnia due to other mental disorder: Secondary | ICD-10-CM

## 2024-08-01 DIAGNOSIS — F411 Generalized anxiety disorder: Secondary | ICD-10-CM

## 2024-08-01 MED ORDER — ESCITALOPRAM OXALATE 10 MG PO TABS: 10.0000 mg | ORAL_TABLET | Freq: Every day | ORAL | 3 refills | Status: AC

## 2024-08-01 MED ORDER — HYDROXYZINE HCL 10 MG PO TABS: ORAL_TABLET | ORAL | 3 refills | Status: AC

## 2024-08-01 NOTE — Progress Notes (Unsigned)
 Patient Name: Jennifer Jones  [id number]  [id number]  Date of Service: 08/01/2024  Chief Complaint  Patient presents with   Follow-up    Anxiety     HPI     Current Medication: Outpatient Encounter Medications as of 08/01/2024  Medication Sig   Accu-Chek Softclix Lancets lancets Use as instructed 1x a day   cholecalciferol (VITAMIN D ) 1000 UNITS tablet Take 1,000 Units by mouth daily.    escitalopram  (LEXAPRO ) 10 MG tablet Take 1 tablet (10 mg total) by mouth daily.   glucose blood test strip Use as instructed 1x a day   hydrOXYzine  (ATARAX ) 10 MG tablet Take one tab po at bedtime for sleep   levonorgestrel  (MIRENA ) 20 MCG/24HR IUD 1 each by Intrauterine route once.   metFORMIN  (GLUCOPHAGE ) 500 MG tablet Take 1 tablet (500 mg total) by mouth 2 (two) times daily with a meal.   naproxen  (NAPROSYN ) 500 MG tablet Take 1 tablet (500 mg total) by mouth 2 (two) times daily with a meal.   simvastatin  (ZOCOR ) 20 MG tablet Take 1 tablet (20 mg total) by mouth at bedtime.   tirzepatide  (MOUNJARO ) 2.5 MG/0.5ML Pen Inject 2.5 mg into the skin once a week.   No facility-administered encounter medications on file as of 08/01/2024.    Surgical History: Past Surgical History:  Procedure Laterality Date   CESAREAN SECTION  98.02, 05    x 3   COSMETIC SURGERY  2019    Medical History: Past Medical History:  Diagnosis Date   Anxiety    Bulging lumbar disc    Diabetes mellitus, type II (HCC)    Fatty liver    Fibrocystic breast 03/2007   Genital atrophy of female 2005   H/O varicella    H/O: menorrhagia 2011   Hx gestational diabetes 2002 and 2005   Hx of candidal vulvovaginitis    Hx: UTI (urinary tract infection)    Hyperlipemia    Sciatica    Vitamin D  deficiency     Family History: Family History  Problem Relation Age of Onset   Hypertension Mother    Heart disease Mother    Hyperlipidemia Mother    Depression Father    Hyperlipidemia Sister    Depression Sister     Hyperlipidemia Brother    Anxiety disorder Son    Hyperlipidemia Sister    Depression Sister    Hypertension Maternal Grandfather    Cancer Maternal Grandmother     Social History   Socioeconomic History   Marital status: Legally Separated    Spouse name: Not on file   Number of children: 3   Years of education: bachelor + 2   Highest education level: Not on file  Occupational History   Occupation: Pre K Runner, broadcasting/film/video     Comment: Private Daycare   Tobacco Use   Smoking status: Never   Smokeless tobacco: Never  Vaping Use   Vaping status: Never Used  Substance and Sexual Activity   Alcohol use: Never   Drug use: Never   Sexual activity: Yes    Birth control/protection: I.U.D.    Comment: MIRENA  placed in 2012   Other Topics Concern   Not on file  Social History Narrative   Lives with 3 sons.    Mom visits from [place].    Caffeine- coffee, 1 daily   Social Drivers of Health   Financial Resource Strain: Not on file  Food Insecurity: Food Insecurity Present (04/21/2020)   Hunger Vital Sign  Worried About Programme researcher, broadcasting/film/video in the Last Year: Sometimes true    [employer] of Food in the Last Year: Sometimes true  Transportation Needs: No Transportation Needs (04/21/2020)   PRAPARE - Administrator, Civil Service (Medical): No    Lack of Transportation (Non-Medical): No  Physical Activity: Not on file  Stress: Not on file  Social Connections: Not on file  Intimate Partner Violence: Not on file      Review of Systems  Vital Signs: BP 109/69 (BP Location: Right Arm, Patient Position: Sitting)   Pulse 76   Temp 98.3 F (36.8 C)   Ht 5' 3 (1.6 Jones)   Wt 162 lb (73.5 kg)   SpO2 98%   BMI 28.70 kg/Jones    Physical Exam     Assessment/Plan:   General Counseling: Jennifer Jones verbalizes understanding of the findings of todays visit and agrees with plan of treatment. I have discussed any further diagnostic evaluation that may be needed or ordered today. We also  reviewed her medications today. she has been encouraged to call the office with any questions or concerns that should arise related to todays visit.    No orders of the defined types were placed in this encounter.   No orders of the defined types were placed in this encounter.   Total time spent:*** Minutes Time spent includes review of chart, medications, test results, and follow up plan with the patient.   [place] Controlled Substance Database was reviewed by me.   Dr Jennifer Jones Jennifer Jones Internal medicine

## 2024-08-03 ENCOUNTER — Ambulatory Visit
Admission: RE | Admit: 2024-08-03 | Discharge: 2024-08-03 | Disposition: A | Source: Ambulatory Visit | Attending: Family Medicine

## 2024-08-03 DIAGNOSIS — M5416 Radiculopathy, lumbar region: Secondary | ICD-10-CM

## 2024-08-04 DIAGNOSIS — E781 Pure hyperglyceridemia: Secondary | ICD-10-CM | POA: Diagnosis not present

## 2024-08-17 DIAGNOSIS — K219 Gastro-esophageal reflux disease without esophagitis: Secondary | ICD-10-CM | POA: Diagnosis not present

## 2024-08-17 DIAGNOSIS — R239 Unspecified skin changes: Secondary | ICD-10-CM | POA: Diagnosis not present

## 2024-08-19 DIAGNOSIS — Z1231 Encounter for screening mammogram for malignant neoplasm of breast: Secondary | ICD-10-CM | POA: Diagnosis not present

## 2024-09-02 DIAGNOSIS — M5451 Vertebrogenic low back pain: Secondary | ICD-10-CM | POA: Diagnosis not present

## 2024-10-01 DIAGNOSIS — L814 Other melanin hyperpigmentation: Secondary | ICD-10-CM | POA: Diagnosis not present

## 2024-10-01 DIAGNOSIS — L539 Erythematous condition, unspecified: Secondary | ICD-10-CM | POA: Diagnosis not present

## 2024-10-01 DIAGNOSIS — L853 Xerosis cutis: Secondary | ICD-10-CM | POA: Diagnosis not present

## 2024-10-02 DIAGNOSIS — N898 Other specified noninflammatory disorders of vagina: Secondary | ICD-10-CM | POA: Diagnosis not present

## 2024-10-02 DIAGNOSIS — Z113 Encounter for screening for infections with a predominantly sexual mode of transmission: Secondary | ICD-10-CM | POA: Diagnosis not present

## 2024-10-02 DIAGNOSIS — N9089 Other specified noninflammatory disorders of vulva and perineum: Secondary | ICD-10-CM | POA: Diagnosis not present

## 2024-10-20 ENCOUNTER — Ambulatory Visit: Admitting: Internal Medicine

## 2024-11-06 ENCOUNTER — Encounter: Payer: Self-pay | Admitting: Internal Medicine

## 2024-11-06 ENCOUNTER — Ambulatory Visit: Admitting: Internal Medicine

## 2024-11-06 ENCOUNTER — Other Ambulatory Visit

## 2024-11-06 VITALS — BP 120/70 | HR 71 | Ht 63.0 in | Wt 165.4 lb

## 2024-11-06 DIAGNOSIS — E161 Other hypoglycemia: Secondary | ICD-10-CM | POA: Diagnosis not present

## 2024-11-06 DIAGNOSIS — E1169 Type 2 diabetes mellitus with other specified complication: Secondary | ICD-10-CM

## 2024-11-06 DIAGNOSIS — E785 Hyperlipidemia, unspecified: Secondary | ICD-10-CM

## 2024-11-06 DIAGNOSIS — Z794 Long term (current) use of insulin: Secondary | ICD-10-CM

## 2024-11-06 DIAGNOSIS — E559 Vitamin D deficiency, unspecified: Secondary | ICD-10-CM

## 2024-11-06 DIAGNOSIS — E663 Overweight: Secondary | ICD-10-CM | POA: Diagnosis not present

## 2024-11-06 DIAGNOSIS — E1165 Type 2 diabetes mellitus with hyperglycemia: Secondary | ICD-10-CM | POA: Diagnosis not present

## 2024-11-06 LAB — LIPID PANEL W/REFLEX DIRECT LDL
Cholesterol: 197 mg/dL
HDL: 47 mg/dL — ABNORMAL LOW
LDL Cholesterol (Calc): 130 mg/dL — ABNORMAL HIGH
Non-HDL Cholesterol (Calc): 150 mg/dL — ABNORMAL HIGH
Total CHOL/HDL Ratio: 4.2 (calc)
Triglycerides: 97 mg/dL

## 2024-11-06 LAB — POCT GLYCOSYLATED HEMOGLOBIN (HGB A1C): Hemoglobin A1C: 6.3 % — AB (ref 4.0–5.6)

## 2024-11-06 MED ORDER — TIRZEPATIDE 5 MG/0.5ML ~~LOC~~ SOAJ: 5.0000 mg | SUBCUTANEOUS | 3 refills | Status: AC

## 2024-11-06 NOTE — Patient Instructions (Addendum)
 Please continue: - Metformin  500 mg 2x a day  Please increase: - Mounjaro  5 mg weekly  Please check blood sugars 1x a day, rotating check times.  Please return in 3-4 months.

## 2024-11-06 NOTE — Progress Notes (Addendum)
 Patient ID: Jennifer Jones, female   DOB: 18-Aug-1976, 49 y.o.   MRN: [id number]  HPI: Jennifer Jones is a 49 y.o.-year-old female, returning for f/u for type II diabetes, diagnosed in 2024, previously prediabetes since 06/2011, non-insulin-dependent, without long-term complications and also, idiopathic postprandial sd. She also saw Drs. Altheimer and Faythe in the past. Last visit 4.5 months ago.  Interim history: No increased urination, blurry vision, nausea, chest pain.   At last visit she was off Ozempic  for 3 months due to losing insurance.  However, since last visit, she got Medicaid. She was able to start Mounjaro  >> no decreased appetite and noticed weight gain but she is still taking a lower dose. She is doing a dry fast now.  She eats breakfast before sunrise and dinner after sunset.  Diabetes: Reviewed HbA1c levels: Lab Results  Component Value Date   HGBA1C 6.6 (A) 06/17/2024   HGBA1C 6.4 (A) 08/22/2023   HGBA1C 6.6 (A) 02/19/2023  12/20/2023: HbA1c 6.2% 04/16/2023: HbA1c 6.3%  02/01/2021: HbA1c 6.5% 11/02/2020: HbA1c 6.2% Initial HbA1c was 6.4% >> lost 10 lbs >> HbA1c decreased.   She is on: - Metformin  [phone] mg twice a day >> 500 mg once a day at night >> metformin  ER 1000 mg with dinner >> 500 mg 2x a day >> 1-2x a day - Mounjaro  2.5 mg weekly-started 06/2024 She was previously on Ozempic  0.25 mg weekly >> stopped due to insurance coverage Previously on Farxiga  10 mg daily >> off 2/2 increased hunger and recurrent yeast infections. She tried Metformin  ER >> no changes. We tried acarbose  25 mg before breakfast but she developed bloating and could not tolerate it. She tried Trulicity  but developed an itching rash and had to stop.  Pt checks her sugars occasionally:: - am:  126-130 >> 125-140 >> 126-130 >> 124-138 >> n/c - 2h after b'fast: 160-210  >> 170-200 >> n/c >> 170 >> n/c - before lunch: n/c >> n/c >> 108 >> 132  >> 83, 117 >> n/c - 2h after lunch: n/c >>  80,120-140 >> n/c  - before dinner: 90-110 >> 90-99 >> n/c  - 2h after dinner: 170-220, 240 >> 160-170 >> 109-193 >> 143 >> 180-190 - bedtime: n/c >> 116-120 >> n/c - nighttime: n/c No lows. Lowest sugar was 70s >> .Jennifer Jones >> 109 >> 83; she has hypoglycemia awareness in the 70s. Highest sugar was 240 (Ramadan, off meds) >> 170 >> 190s >> 170 >> 190  Glucometer: Accu-Chek.  Pt's meals are: - Breakfast: whole wheat + olive oil, avocado + eggs - Lunch: sandwich  - Dinner: meat + starch + veggies - Snacks: nuts, fruit No regular exercise but stays active. She does not drink sweet drinks. She saw nutrition in the past.  -No CKD, last BUN/creatinine:  Lab Results  Component Value Date   BUN 13 06/17/2024   CREATININE 0.53 06/17/2024   Lab Results  Component Value Date   MICRALBCREAT 26 06/17/2024   MICRALBCREAT 8 04/19/2016   -+ HL; last set of lipids: Lab Results  Component Value Date   CHOL 169 06/17/2024   HDL 35 (L) 06/17/2024   LDLCALC  06/17/2024     Comment:     . LDL cholesterol not calculated. Triglyceride levels greater than 400 mg/dL invalidate calculated LDL results. . Reference range: <100 . Desirable range <100 mg/dL for primary prevention;   <70 mg/dL for patients with CHD or diabetic patients  with > or = 2 CHD risk factors. Jennifer Jones  LDL-C is now calculated using the Martin-Hopkins  calculation, which is a validated novel method providing  better accuracy than the Friedewald equation in the  estimation of LDL-C.  Jennifer Jones  (http://education.[facility].com/faq/FAQ164)    LDLDIRECT 106 (H) 06/17/2024   TRIG 546 (H) 06/17/2024   CHOLHDL 4.8 06/17/2024  She continues on simvastatin  20 mg daily, with occasional muscle aches.  At our visit in 06/2024, I also suggested fish oil 1000 mg twice a day.  She has fatty liver per right upper quadrant ultrasound (12/28/2013): The liver demonstrates coarse echotexture and  increased echogenicity, likely reflecting diffuse steatosis. No overt cirrhotic contour abnormalities or focal lesions are identified. There is no evidence of intrahepatic biliary ductal dilatation. The portal vein is open.   IMPRESSION: Findings consistent with hepatic steatosis.   She had an EGD in 2022 >> stomach ulcers. Off Protonix now.  - last eye exam was in 2024: No DR reportedly.  - no numbness and tingling in her feet.  Last foot exam 12/2023. She developed numbness and discomfort in 2023. She saw podiatry >> got steroid injections. She saw neurology >> extensive investigation showed a high vitamin B6. She was told to stop vitamins, animal organs, whole grains, fish, legumes. She was on Gabapentin x 1 mo and Diclofenac  >> stopped. On Turmeric. Sxs improved, with only the right 2 toes affected.  Idiopathic postprandial sd.  Reviewed  history: At last visit, she complained of sxs of hypoglycemia without low CBG readings starting 2012.   During this episodes, she felt very fatigued, tremulous, 45 minutes to 2 hours after eating, especially after breakfast.  The episodes were not improving with glucose intake.  Sugars normal during the episodes.    Interestingly, she had a low blood sugar of 45 at one-point but without associated symptoms.  This happened after she increase the Metformin  dose to 1000 mg twice a day, but then switched to a lower dose.  No low blood sugars anymore.  History vitamin D  deficiency:  Previously on 2000 units daily, but we increased the dose to 4000 units daily in 2016. Then on 5000 units daily >> stopped 06/2022 to decrease her medication burden. Now on ~5000 units qod.  Review levels: Lab Results  Component Value Date   VD25OH 32 06/17/2024   VD25OH 40.93 08/22/2023   VD25OH 39.99 08/02/2022   VD25OH 36.32 10/26/2021   VD25OH 39.5 12/15/2020   VD25OH 42.2 09/17/2019   VD25OH 28.1 (L) 01/29/2017   VD25OH 39 09/23/2014   VD25OH 47 06/04/2014    Other labs reviewed: 02/13/2023: TSH 0.721 05/19/2020: TSH 0.59  02/13/2023: Ferritin 85  ROS: Constitutional:  + See HPI  I reviewed pt's medications, allergies, PMH, social hx, family hx, and changes were documented in the history of present illness. Otherwise, unchanged from my initial visit note.  Past Medical History:  Diagnosis Date   Anxiety    Bulging lumbar disc    Diabetes mellitus, type II (HCC)    Fatty liver    Fibrocystic breast 03/2007   Genital atrophy of female 2005   H/O varicella    H/O: menorrhagia 2011   Hx gestational diabetes 2002 and 2005   Hx of candidal vulvovaginitis    Hx: UTI (urinary tract infection)    Hyperlipemia    Sciatica    Vitamin D  deficiency    Past Surgical History:  Procedure Laterality Date   CESAREAN SECTION  98.02, 05    x 3  COSMETIC SURGERY  2019   History   Social History   Marital Status: Married    Spouse Name: N/A   Number of Children: 3   Years of Education: bachelor    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   Mom visits from [place].    Current Outpatient Medications on File Prior to Visit  Medication Sig Dispense Refill   Accu-Chek Softclix Lancets lancets Use as instructed 1x a day 100 each 3   cholecalciferol (VITAMIN D ) 1000 UNITS tablet Take 1,000 Units by mouth daily.      escitalopram  (LEXAPRO ) 10 MG tablet Take 1 tablet (10 mg total) by mouth daily. 90 tablet 3   glucose blood test strip Use as instructed 1x a day 100 each 3   hydrOXYzine  (ATARAX ) 10 MG tablet Take one tab po at bedtime for sleep 30 tablet 3   levonorgestrel  (MIRENA ) 20 MCG/24HR IUD 1 each by Intrauterine route once.     metFORMIN   (GLUCOPHAGE ) 500 MG tablet Take 1 tablet (500 mg total) by mouth 2 (two) times daily with a meal. 180 tablet 3   naproxen  (NAPROSYN ) 500 MG tablet Take 1 tablet (500 mg total) by mouth 2 (two) times daily with a meal. 14 tablet 0   simvastatin  (ZOCOR ) 20 MG tablet Take 1 tablet (20 mg total) by mouth at bedtime. 90 tablet 3   tirzepatide  (MOUNJARO ) 2.5 MG/0.5ML Pen Inject 2.5 mg into the skin once a week. 2 mL 3   No current facility-administered medications on file prior to visit.   Allergies  Allergen Reactions   Codeine Itching   Cyclobenzaprine      Other reaction(s): hives, with naprosyn    Darvocet [Propoxyphene N-Acetaminophen] Itching   Dilaudid [Hydromorphone Hcl] Itching   Dulaglutide      Other reaction(s): local reaction   Percocet [Oxycodone-Acetaminophen] Itching   Triamcinolone  Rash    Cream caused rash   Family History  Problem Relation Age of Onset   Hypertension Mother    Heart disease Mother    Hyperlipidemia Mother    Depression Father    Hyperlipidemia Sister    Depression Sister    Hyperlipidemia Brother    Anxiety disorder Son    Hyperlipidemia Sister    Depression Sister    Hypertension Maternal Grandfather    Cancer Maternal Grandmother    PE: BP 120/70   Pulse 71   Ht 5' 3 (1.6 m)   Wt 165 lb 6.4 oz (75 kg)   SpO2 97%   BMI 29.30 kg/m   Wt Readings from Last 15 Encounters:  11/06/24 165 lb 6.4 oz (75 kg)  08/01/24 162 lb (73.5 kg)  06/17/24 164 lb 9.6 oz (74.7 kg)  05/02/24 164 lb 6.4 oz (74.6 kg)  12/20/23 162 lb 3.2 oz (73.6 kg)  08/22/23 167 lb (75.8 kg)  02/19/23 162 lb 6.4 oz (73.7 kg)  08/02/22 164 lb 12.8 oz (74.8 kg)  07/25/22 165 lb 4 oz (75 kg)  01/29/22 162 lb (73.5 kg)  10/26/21 171 lb 6.4 oz (77.7 kg)  07/28/21 164 lb (74.4  kg)  06/15/21 167 lb 3.2 oz (75.8 kg)  12/15/20 163 lb 12.8 oz (74.3 kg)  04/21/20 158 lb (71.7 kg)   Constitutional: normal overweight, in NAD Eyes:  EOMI, no exophthalmos ENT: no neck masses, no  cervical lymphadenopathy Cardiovascular: RRR, No MRG Respiratory: CTA B Musculoskeletal: no deformities Skin:no rashes Neurological: no tremor with outstretched hands Diabetic Foot Exam - Simple   Simple Foot Form Diabetic Foot exam was performed with the following findings: Yes 11/06/2024  4:00 PM  Visual Inspection No deformities, no ulcerations, no other skin breakdown bilaterally: Yes Sensation Testing Intact to touch and monofilament testing bilaterally: Yes Pulse Check Posterior Tibialis and Dorsalis pulse intact bilaterally: Yes Comments    ASSESSMENT: 1.  DM2, non-insulin-dependent  2. Idiopathic postprandial sd.  3. Vitamin D  deficiency  4.  Overweight  PLAN:  1. Patient with history of prediabetes, but with a new diagnosis of diabetes in 01/2021.  She continues on a low-dose metformin  and her HbA1c at last visit was 6.6%, slightly higher than before.  She was previously on an SGLT2 inhibitor but she did not have good tolerance to this due to increased hunger and yeast infections (treated by OB/GYN).  We did try Ozempic  but she was not able to continue due to losing her insurance.  She did mention that her sugars improved on this.  She also felt that she gained some weight on Ozempic .  We discussed about trying Mounjaro  and she agreed to try this at last visit.  I sent the lowest dose to her pharmacy and advised her to let me know if we could increase the dose.  We continue to the same dose of metformin . - At today's visit, she is only checking sugars occasionally after dinner.  Sugars are slightly above target.  She does mention that after starting Mounjaro , she did not notice a decrease in appetite or in weight.  However, she gained less than 1 pound since last visit, despite the holidays.  She did not check sugars consistently to see the effect of Mounjaro  on her diabetes but at today's visit the HbA1c is lower.  For now, we discussed about increasing the dose of Mounjaro  and  we will continue with the same dose of metformin .  She occasionally misses a dose.  I strongly advised her to check sugars at different times of the day, rotating check times. - I advised her to: Patient Instructions  Please continue: - Metformin  500 mg 2x a day  Please increase: - Mounjaro  5 mg weekly  Please check blood sugars 1x a day, rotating check times.  Please return in 3-4 months.  - we checked her HbA1c: 6.3% (lower) - advised to check sugars at different times of the day - 1x a day, rotating check times - advised for yearly eye exams >> she is not UTD - return to clinic in 4 months  2.  Idiopathic postprandial sd. - She has a history of postprandial fatigue and tremors, but not associated with hypoglycemia - We tried acarbose  but she did not tolerate it due to bloating and gas - She saw nutrition in the past - Previously on a keto diet but she did not feel good on this, in fact, she felt worse - She previously described lower blood sugars in the middle of the day after taking metformin  in the morning.  We moved metformin  to dinnertime and she had no more low blood sugars during the day.   - No low CBGs recently  3. Vitamin D  deficiency  - Continues 5000 units vitamin D  every other day - Vitamin D  level was normal at last check: Lab Results  Component Value Date   VD25OH 32 06/17/2024  - We will continue to keep an eye on this  4. HL - Reviewed latest lipid panel - high TG and LDL Lab Results  Component Value Date   CHOL 169 06/17/2024   HDL 35 (L) 06/17/2024   LDLCALC  06/17/2024     Comment:     . LDL cholesterol not calculated. Triglyceride levels greater than 400 mg/dL invalidate calculated LDL results. . Reference range: <100 . Desirable range <100 mg/dL for primary prevention;   <70 mg/dL for patients with CHD or diabetic patients  with > or = 2 CHD risk factors. Jennifer Jones LDL-C is now calculated using the Martin-Hopkins  calculation, which is a validated  novel method providing  better accuracy than the Friedewald equation in the  estimation of LDL-C.  Jennifer Jones  (http://education.[facility].com/faq/FAQ164)    LDLDIRECT 106 (H) 06/17/2024   TRIG 546 (H) 06/17/2024   CHOLHDL 4.8 06/17/2024  - Continues Zocor  40 mg without side effects. - we will repeat this today - fasting  Component     Latest Ref Rng 06/17/2024 11/06/2024  Hemoglobin A1C     4.0 - 5.6 % 6.6 !  6.3 !   Cholesterol     <200 mg/dL 830  802   Triglycerides     <150 mg/dL 453 (H)  97   HDL Cholesterol     > OR = 50 mg/dL 35 (L)  47 (L)   Total CHOL/HDL Ratio     <5.0 (calc) 4.8  4.2   LDL Cholesterol (Calc)     mg/dL (calc) -  869 (H)   Direct LDL 106 -  Non-HDL Cholesterol (Calc)     <130 mg/dL (calc) 865 (H)  849 (H)   Significant improvement in her TG!! LDL higher probably due to direct vs calculated value or due to TG improvement.  Lela Fendt, MD PhD [facility]

## 2024-11-09 ENCOUNTER — Ambulatory Visit: Payer: Self-pay | Admitting: Internal Medicine

## 2025-03-09 ENCOUNTER — Ambulatory Visit: Admitting: Internal Medicine

## 2025-03-30 ENCOUNTER — Ambulatory Visit: Admitting: Dermatology

## 2025-07-17 ENCOUNTER — Ambulatory Visit: Payer: Self-pay | Admitting: Internal Medicine
